# Patient Record
Sex: Male | Born: 1940
Health system: Southern US, Community
[De-identification: ages and names within clinical notes are randomized; demographics above are authoritative.]

## PROBLEM LIST (undated history)

## (undated) DIAGNOSIS — E785 Hyperlipidemia, unspecified: Secondary | ICD-10-CM

## (undated) DIAGNOSIS — M1990 Unspecified osteoarthritis, unspecified site: Secondary | ICD-10-CM

## (undated) DIAGNOSIS — K219 Gastro-esophageal reflux disease without esophagitis: Secondary | ICD-10-CM

## (undated) DIAGNOSIS — C801 Malignant (primary) neoplasm, unspecified: Secondary | ICD-10-CM

## (undated) DIAGNOSIS — I499 Cardiac arrhythmia, unspecified: Secondary | ICD-10-CM

## (undated) DIAGNOSIS — I10 Essential (primary) hypertension: Secondary | ICD-10-CM

## (undated) DIAGNOSIS — N200 Calculus of kidney: Secondary | ICD-10-CM

## (undated) DIAGNOSIS — I209 Angina pectoris, unspecified: Secondary | ICD-10-CM

## (undated) HISTORY — DX: Gastro-esophageal reflux disease without esophagitis: K21.9

## (undated) HISTORY — DX: Essential (primary) hypertension: I10

## (undated) HISTORY — DX: Calculus of kidney: N20.0

## (undated) HISTORY — DX: Hyperlipidemia, unspecified: E78.5

## (undated) HISTORY — PX: TOTAL KNEE ARTHROPLASTY: SHX125

---

## 1963-03-20 HISTORY — PX: EYE SURGERY: SHX253

## 2005-08-02 ENCOUNTER — Inpatient Hospital Stay (HOSPITAL_COMMUNITY): Admission: RE | Admit: 2005-08-02 | Discharge: 2005-08-06 | Payer: Self-pay | Admitting: Orthopedic Surgery

## 2008-04-21 ENCOUNTER — Ambulatory Visit (HOSPITAL_COMMUNITY): Admission: RE | Admit: 2008-04-21 | Discharge: 2008-04-21 | Payer: Self-pay | Admitting: Orthopedic Surgery

## 2009-06-08 ENCOUNTER — Encounter: Payer: Self-pay | Admitting: Cardiovascular Disease

## 2009-06-15 DIAGNOSIS — E785 Hyperlipidemia, unspecified: Secondary | ICD-10-CM

## 2009-06-15 DIAGNOSIS — R0789 Other chest pain: Secondary | ICD-10-CM

## 2009-06-15 DIAGNOSIS — M170 Bilateral primary osteoarthritis of knee: Secondary | ICD-10-CM | POA: Insufficient documentation

## 2009-06-15 DIAGNOSIS — R9431 Abnormal electrocardiogram [ECG] [EKG]: Secondary | ICD-10-CM | POA: Insufficient documentation

## 2009-06-15 DIAGNOSIS — K219 Gastro-esophageal reflux disease without esophagitis: Secondary | ICD-10-CM

## 2009-06-16 ENCOUNTER — Ambulatory Visit: Payer: Self-pay | Admitting: Cardiovascular Disease

## 2009-06-16 DIAGNOSIS — R072 Precordial pain: Secondary | ICD-10-CM | POA: Insufficient documentation

## 2009-06-16 DIAGNOSIS — R079 Chest pain, unspecified: Secondary | ICD-10-CM

## 2009-06-28 ENCOUNTER — Telehealth (INDEPENDENT_AMBULATORY_CARE_PROVIDER_SITE_OTHER): Payer: Self-pay | Admitting: *Deleted

## 2009-06-29 ENCOUNTER — Ambulatory Visit (HOSPITAL_COMMUNITY): Admission: RE | Admit: 2009-06-29 | Discharge: 2009-06-29 | Payer: Self-pay | Admitting: Cardiovascular Disease

## 2009-06-29 ENCOUNTER — Encounter (HOSPITAL_COMMUNITY): Admission: RE | Admit: 2009-06-29 | Discharge: 2009-08-17 | Payer: Self-pay | Admitting: Cardiovascular Disease

## 2009-06-29 ENCOUNTER — Encounter: Payer: Self-pay | Admitting: Cardiovascular Disease

## 2009-06-29 ENCOUNTER — Ambulatory Visit: Payer: Self-pay

## 2009-06-29 ENCOUNTER — Ambulatory Visit: Payer: Self-pay | Admitting: Cardiovascular Disease

## 2009-07-12 ENCOUNTER — Ambulatory Visit: Payer: Self-pay | Admitting: Cardiovascular Disease

## 2009-07-12 DIAGNOSIS — I119 Hypertensive heart disease without heart failure: Secondary | ICD-10-CM | POA: Insufficient documentation

## 2010-04-18 NOTE — Progress Notes (Signed)
Summary: Nucle ar Pre-Procedure  Phone Note Outgoing Call Call back at [facility] Phone [phone]   Call placed by: Stanton Kidney, EMT-P,  June 28, 2009 2:21 PM Action Taken: Phone Call Completed Summary of Call: Reviewed information on Myoview Information Sheet (see scanned document for further details).  Spoke with Patient's wife.    Nuclear Med Background Indications for Stress Test: Evaluation for Ischemia   History: GXT  History Comments: 3/11 GXT: Non- Dx, 2' RBBB  Symptoms: Chest Pain, Chest Pressure    Nuclear Pre-Procedure Cardiac Risk Factors: Family History - CAD, Lipids, RBBB Height (in): 68

## 2010-04-18 NOTE — Assessment & Plan Note (Signed)
Summary: Cardiology Nuclear Study  Nuclear Med Background Indications for Stress Test: Evaluation for Ischemia   History: GXT  History Comments: 3/11 GXT: Non- Dx, 2' RBBB  Symptoms: Chest Pain, Chest Pressure  Symptoms Comments: Last episode of YN:WGNFAOZH, now, 1/10.   Nuclear Pre-Procedure Cardiac Risk Factors: Family History - CAD, Lipids, RBBB Caffeine/Decaff Intake: None NPO After: 8:00 AM Lungs: Clear IV 0.9% NS with Angio Cath: 20g     IV Site: (R) AC IV Started by: Irean Hong RN Chest Size (in) 44     Height (in): 68 Weight (lb): 198 BMI: 30.21  Nuclear Med Study 1 or 2 day study:  1 day     Stress Test Type:  Stress Reading MD:  Charlton Haws, MD     Referring MD:  Verne Carrow, MD Resting Radionuclide:  Technetium 34m Tetrofosmin     Resting Radionuclide Dose:  11 mCi  Stress Radionuclide:  Technetium 9m Tetrofosmin     Stress Radionuclide Dose:  33 mCi   Stress Protocol Exercise Time (min):  5:30 min     Max HR:  142 bpm     Predicted Max HR:  152 bpm  Max Systolic BP: 225 mm Hg     Percent Max HR:  93.42 %     METS: 7.0 Rate Pressure Product:  08657    Stress Test Technologist:  Rea College CMA-N     Nuclear Technologist:  Domenic Polite CNMT  Rest Procedure  Myocardial perfusion imaging was performed at rest 45 minutes following the intravenous administration of Myoview Technetium 19m Tetrofosmin.  Stress Procedure  The patient exercised for  5:30.  The patient stopped due to a hypertensive response, 225/92.  He denied any chest pain.  There were nonspecific T- wave changes with exercise and occasional PVC's in recovery.  Myoview was injected at peak exercise and myocardial perfusion imaging was performed after a brief delay.  QPS Raw Data Images:  Normal; no motion artifact; normal heart/lung ratio. Stress Images:  NI: Uniform and normal uptake of tracer in all myocardial segments. Rest Images:  Normal homogeneous uptake in all areas of the  myocardium. Subtraction (SDS):  Normal Transient Ischemic Dilatation:  .88  (Normal <1.22)  Lung/Heart Ratio:  .42  (Normal <0.45)  Quantitative Gated Spect Images QGS EDV:  108 ml QGS ESV:  44 ml QGS EF:  59 % QGS cine images:  normal  Findings Normal nuclear study      Overall Impression  Exercise Capacity: Fair exercise capacity. BP Response: Hypertensive blood pressure response. Clinical Symptoms: dyspnea ECG Impression: RBBB Overall Impression: Normal stress nuclear study. Overall Impression Comments: normal  Appended Document: Cardiology Nuclear Study Normal stress. No ischemia. Can we call him with the results. cdm  Appended Document: Cardiology Nuclear Study wife notified

## 2010-04-18 NOTE — Assessment & Plan Note (Signed)
Summary: per check out/sf   Visit Type:  Follow-up Primary Provider:  Helene Kelp, [facility]  CC:  chest pain.  History of Present Illness: 70 yo male with history of OA, hyperlipidemia and GERD here today for follow up. He was seen several weeks ago for further evaluation of chest pain. He told  me that he has been having left sided chest pain, dull ache, no radiation, mostly occurs with rest. No associated SOB, dizziness, nausea, vomiting, near syncope or syncope. It does seem to improve when he leans forward. He had a severe episode of chest pain seven years ago while moving heavy objects, resolved with rest. He had no cardiac workup at that time. Prior to the initial visit, he picked a heavy log up and he had the same sensation of heaviness that only lasted for a few seconds. No associated symptoms. [place] is [place] farm. His exercise tolerance has been good.   He has had some mild left sided pain since the last visit. No associated SOB, dizziness, nausea, near syncope or syncope. The pain is positional and improves if he moves around. His stress test showed no ischemia (see below). Echo with mild LVH with preserved LV systolic function.  He did have a hypertensive blood pressure response during the stress test with BP of 225/92.   Current Medications (verified): 1)  Nexium 40 Mg Cpdr (Esomeprazole Magnesium) .Marland Kitchen.. 1 Tab By Mouth Once Daily 2)  Lipitor 20 Mg Tabs (Atorvastatin Calcium) .... Take One Tablet By Mouth Daily. 3)  Meloxicam 15 Mg Tabs (Meloxicam) .... Tab By Mouth Once Daily 4)  Lovaza 1 Gm Caps (Omega-3-Acid Ethyl Esters) .... 2  Tabs By Mouth Once Daily 5)  Aspirin 81 Mg Tbec (Aspirin) .... Take One Tablet By Mouth Daily 6)  Multivitamins   Tabs (Multiple Vitamin) .Marland Kitchen.. 1 Tab By Mouth Once Daily 7)  Calcium-Vitamin D 500-200 Mg-Unit Tabs (Calcium Carbonate-Vitamin D) .Marland Kitchen.. 1 Tab By Mouth Once Daily 8)  Vitamin E 400 Unit Caps (Vitamin E) .Marland Kitchen.. 1 Tab By Mouth Once Daily 9)   Vitamin D3 400 Unit Tabs (Cholecalciferol) .Marland Kitchen.. 1 Tab By Mouth Once Daily  Allergies (verified): 1)  ! Pcn 2)  ! Sulfa  Past History:  Past Medical History: Last updated: 06/16/2009 GERD (ICD-530.81) OSTEOARTHRITIS, KNEE (ICD-715.96) HYPERLIPIDEMIA (ICD-272.4) Nephrolithiasis    Social History: Reviewed history from 06/16/2009 and no changes required. 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  The patient denies fatigue, malaise, fever, weight gain/loss, vision loss, decreased hearing, hoarseness, palpitations, shortness of breath, prolonged cough, wheezing, sleep apnea, coughing up blood, abdominal pain, blood in stool, nausea, vomiting, diarrhea, heartburn, incontinence, blood in urine, muscle weakness, joint pain, leg swelling, rash, skin lesions, headache, fainting, dizziness, depression, anxiety, enlarged lymph nodes, easy bruising or bleeding, and environmental allergies.    Vital Signs:  Patient profile:   70 year old male Height:      68 inches Weight:      199 pounds BMI:     30.37 Pulse rate:   54 / minute Resp:     16 per minute BP sitting:   142 / 80  (left arm)  Vitals Entered By: Marrion Coy, CNA (July 12, 2009 12:04 PM)  Physical Exam  General:  General: Well developed, well nourished, NAD Psychiatric: Mood and affect normal Neck: No  JVD, no carotid bruits, no thyromegaly, no lymphadenopathy. Lungs:Clear bilaterally, no wheezes, rhonci, crackles CV: RRR no murmurs, gallops rubs Abdomen: soft, NT, ND, BS present Extremities: No edema, pulses 2+.    Nuclear ETT  Procedure date:  06/29/2009  Findings:      The patient exercised for  5:30.  The patient stopped due to a hypertensive response, 225/92.  He denied any chest pain.  There were nonspecific T- wave changes with exercise and occasional PVC's in recovery.  Myoview was  injected at peak exercise and myocardial perfusion imaging was performed after a brief delay.  QPS  Raw Data Images:  Normal; no motion artifact; normal heart/lung ratio. Stress Images:  NI: Uniform and normal uptake of tracer in all myocardial segments. Rest Images:  Normal homogeneous uptake in all areas of the myocardium. Subtraction (SDS):  Normal Transient Ischemic Dilatation:  .88  (Normal <1.22)  Lung/Heart Ratio:  .42  (Normal <0.45)  Quantitative Gated Spect Images  QGS EDV:  108 ml QGS ESV:  44 ml QGS EF:  59 % QGS cine images:  normal  Findings  Normal nuclear study  Overall Impression   Exercise Capacity: Fair exercise capacity. BP Response: Hypertensive blood pressure response. Clinical Symptoms: dyspnea ECG Impression: RBBB Overall Impression: Normal stress nuclear study. Overall Impression Comments: normal  Echocardiogram  Procedure date:  06/29/2009  Findings:      Study Conclusions            - Left ventricle: The cavity size was normal. Wall thickness was       increased in a pattern of mild LVH. Systolic function was normal.       The estimated ejection fraction was in the range of 55% to 60%.     - Aortic valve: Valve area: 2.08cm 2(VTI). Valve area: 2.11cm 2       (Vmax).     - Left atrium: The atrium was mildly dilated.     - Atrial septum: No defect or patent foramen ovale was identified.  Impression & Recommendations:  Problem # 1:  CHEST PAIN (ICD-786.50) Non-cardiac chest pain. Stress test without ischemia. This could be related to his GERD or musculoskeletal.  No further cardiac workup at this time.   His updated medication list for this problem includes:    Aspirin 81 Mg Tbec (Aspirin) .Marland Kitchen... Take one tablet by mouth daily    Amlodipine Besylate 5 Mg Tabs (Amlodipine besylate) .Marland Kitchen... Take one tablet by mouth daily  Problem # 2:  HYPERTENSION, HEART CONTROLLED W/O ASSOC CHF (ICD-402.10) His resting BP is slightly above baseline. He had a  hypertensive response to exercise during the stress test. His echo shows mild LVH c/'w hypertensive heart disease. Will start Norvasc 5 mg by mouth once daily. I willl have him follow his BP trend at home. He will follow up in [facility] with Helene Kelp, [facility] for further titration of Norvasc or addition of other BP meds. He will bring his BP log into the follow up appt.   His updated medication list for this problem includes:    Aspirin 81 Mg Tbec (Aspirin) .Marland Kitchen... Take one tablet by mouth daily    Amlodipine Besylate 5 Mg Tabs (Amlodipine besylate) .Marland Kitchen... Take one tablet by mouth daily  His updated medication list for this problem includes:    Aspirin 81 Mg Tbec (Aspirin) .Marland Kitchen... Take one tablet by mouth daily    Amlodipine Besylate 5 Mg Tabs (Amlodipine besylate) .Marland Kitchen... Take one tablet  by mouth daily  Patient Instructions: 1)  Your physician recommends that you schedule a follow-up appointment as needed 2)  Your physician has recommended you make the following change in your medication: Start Norvasc (amlodipine) 5 mg by mouth daily Prescriptions: AMLODIPINE BESYLATE 5 MG TABS (AMLODIPINE BESYLATE) take one tablet by mouth daily  #30 x 11   Entered by:   Dossie Arbour, RN, BSN   Authorized by:   Verne Carrow, MD   Signed by:   Dossie Arbour, RN, BSN on 07/12/2009   Method used:   Electronically to        [facility] [phone]* (retail)       [address]       [place], [place]  [address]       Ph: [phone] or [phone]       Fax: [phone]   RxID:   [id number]

## 2010-04-18 NOTE — Assessment & Plan Note (Signed)
Summary: np6/ atypical cp, abn treadmill. pt has medicare/ bcbs/ gd   Visit Type:  Initial Consult Primary Provider:  Helene Kelp, [facility]  CC:  Left sided chest pain. Shawn Lambert  History of Present Illness: 70 yo male with history of OA, hyperlipidemia and GERD referred today for further evaluation of chest pain. He tells me that he has been having left sided chest pain, dull ache, no radiation, mostly occurs with rest. No associated SOB, dizziness, nausea, vomiting, near syncope or syncope. It does seem to improve when he leans forward. He had a severe episode of chest pain seven years ago while moving heavy objects, resolved with rest. He had no cardiac workup at that time. Last week, he picked a heavy log up and he had the same sensation of heaviness that only lasted for a few seconds. No associated symptoms. He is very active on his farm. His exercise tolerance has been good.   Current Medications (verified): 1)  Nexium 40 Mg Cpdr (Esomeprazole Magnesium) .Shawn Lambert.. 1 Tab By Mouth Once Daily 2)  Lipitor 20 Mg Tabs (Atorvastatin Calcium) .... Take One Tablet By Mouth Daily. 3)  Meloxicam 15 Mg Tabs (Meloxicam) .... Tab By Mouth Once Daily 4)  Lovaza 1 Gm Caps (Omega-3-Acid Ethyl Esters) .... 2  Tabs By Mouth Once Daily 5)  Aspirin 81 Mg Tbec (Aspirin) .... Take One Tablet By Mouth Daily 6)  Multivitamins   Tabs (Multiple Vitamin) .Shawn Lambert.. 1 Tab By Mouth Once Daily 7)  Calcium-Vitamin D 500-200 Mg-Unit Tabs (Calcium Carbonate-Vitamin D) .Shawn Lambert.. 1 Tab By Mouth Once Daily 8)  Vitamin E 400 Unit Caps (Vitamin E) .Shawn Lambert.. 1 Tab By Mouth Once Daily 9)  Vitamin D3 400 Unit Tabs (Cholecalciferol) .Shawn Lambert.. 1 Tab By Mouth Once Daily  Allergies (verified): 1)  ! Pcn  Past History:  Past Medical History: GERD (ICD-530.81) OSTEOARTHRITIS, KNEE (ICD-715.96) HYPERLIPIDEMIA (ICD-272.4) Nephrolithiasis    Past Surgical History: Left knee replacement 2007 Right eye removed  Family History: Reviewed history and no changes  required. Mother-deceased, CHF, AAA Father-deceased CHF Brother alive, "mild heart attack" age 75  Social History: Reviewed history and no changes required. 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  The patient denies fatigue, malaise, fever, weight gain/loss, vision loss, decreased hearing, hoarseness, palpitations, shortness of breath, prolonged cough, wheezing, sleep apnea, coughing up blood, abdominal pain, blood in stool, nausea, vomiting, diarrhea, heartburn, incontinence, blood in urine, muscle weakness, joint pain, leg swelling, rash, skin lesions, headache, fainting, dizziness, depression, anxiety, enlarged lymph nodes, easy bruising or bleeding, and environmental allergies.    Vital Signs:  Patient profile:   70 year old male Height:      68 inches Weight:      206 pounds BMI:     31.44 Pulse rate:   57 / minute Resp:     14 per minute BP sitting:   136 / 80  (left arm)  Vitals Entered By: Kem Parkinson (June 16, 2009 1:19 PM)  Physical Exam  General:  General: Well developed, well nourished, NAD HEENT: OP clear, mucus membranes moist. Prosthetic right eye. SKIN: warm, dry Neuro: No focal deficits Musculoskeletal: Muscle strength 5/5 all ext Psychiatric: Mood and affect normal Neck: No JVD, no carotid bruits, no thyromegaly, no lymphadenopathy. Lungs:Clear bilaterally, no wheezes, rhonci, crackles CV: RRR no murmurs, gallops rubs Abdomen: soft, NT, ND, BS  present Extremities: No edema, pulses 1+ bilateral PT.     EKG  Procedure date:  06/16/2009  Findings:      Sinus rhythm with rare PVC. LAD. RBBB  Exercise Stress Test  Procedure date:  06/08/2009  Findings:      Performed in Dr. Buel Ream office [facility]):  Target heart rate achieved. Baseline RBBB with non-diagnostic changes during exercise. No chest pain with exercise. Stopped  secondary to THR achieved and knee pain.   Impression & Recommendations:  Problem # 1:  CHEST PAIN (ICD-786.50) His pain is mostly non-exertional, however, he has had severale episodes of heaviness with heavy exertion. His risk factors for CAD include hyperlipidemia and a family history of CAD. The exercise stress test in Dr. Kathi Der office is non-diagnostic with upsloping ST depression with exercise in the setting of RBBB. I will order an exercise stress myoview to further risk stratify.  Will also check echo to assess LV size and function, rule out any valvular abnormalities.   His updated medication list for this problem includes:    Aspirin 81 Mg Tbec (Aspirin) .Shawn Lambert... Take one tablet by mouth daily  Other Orders: EKG w/ Interpretation (93000) Nuclear Stress Test (Nuc Stress Test) Echocardiogram (Echo)  Patient Instructions: 1)  Your physician recommends that you schedule a follow-up appointment in: 3-4 weeks 2)  Your physician has requested that you have an echocardiogram.  Echocardiography is a painless test that uses sound waves to create images of your heart. It provides your doctor with information about the size and shape of your heart and how well your heart's chambers and valves are working.  This procedure takes approximately one hour. There are no restrictions for this procedure. 3)  Your physician has requested that you have an exercise stress myoview.  For further information please visit [email or web address]  Please follow instruction sheet, as given.

## 2010-07-06 ENCOUNTER — Other Ambulatory Visit: Payer: Self-pay | Admitting: Cardiovascular Disease

## 2010-08-04 NOTE — Op Note (Signed)
NAMEABDULAHI, Shawn Lambert NO.:  [id number]   MEDICAL RECORD NO.:  [id number]          PATIENT TYPE:  INP   LOCATION:  5015                         FACILITY:  [facility]   PHYSICIAN:  Almedia Balls. Ranell Patrick, M.D. DATE OF BIRTH:  1940-08-15   DATE OF PROCEDURE:  08/02/2005  DATE OF DISCHARGE:                                 OPERATIVE REPORT   PREOPERATIVE DIAGNOSIS:  Left knee end-stage osteoarthritis.   POSTOPERATIVE DIAGNOSIS:  Left knee end-stage osteoarthritis.   PROCEDURE PERFORMED:  Left knee total knee replacement.   ATTENDING SURGEON:  Almedia Balls. Ranell Patrick, M.D.   ASSISTANT:  Donnie Coffin. Dixon, P.A.-C.   ANESTHESIA:  General anesthesia was used, plus femoral block.   ESTIMATED BLOOD LOSS:  Minimal.   TOURNIQUET TIME:  90 minutes.   INSTRUMENT COUNT:  Correct.   There were no complications.   FLUID REPLACEMENT:  1100 cc crystalloid.   URINE OUTPUT:  400 cc.   INDICATIONS:  The patient is a 70 year old male with a history of worsening  left knee pain.  the patient had a work-related injury which has  precipitated the significant decline in his function and his pain in his  knee.  The patient has progressed to the point now where he has disabling  pain that affects not only his ability to walk but also his ability to rest  comfortably.  The patient presents now for operative knee replacement,  having discussed all other options and failed conservative management to  this point.  Informed consent was obtained.   DESCRIPTION OF PROCEDURE:  After an adequate level of anesthesia was  achieved, the patient was positioned supine on the operating room table.  The left lower extremity was sterilely prepped and draped over a nonsterile  tourniquet.  After elevation and exsanguination of the limb using an Esmarch  bandage, we flexed the knee and elevated the tourniquet up to 300 mmHg.  We  made the knee incision, which was a midline incision, with the knee flexed.  Dissection was carried sharply down through the subcutaneous tissues.  Median parapatellar arthrotomy created utilizing a knife.  We everted the  patella, released the lateral patellofemoral ligaments, and then flexed the  knee again, exposing the distal femur.  We went ahead and made an opening  hole with the distal femoral step cut drill.  Introduction of the  intramedullary guide for resection of the distal 10 mm of femur on 5 degrees  left.  Once the distal femoral cut was made, we went ahead and size the  femur to a size 4, took our 4-in-1 block, and made our anterior and  posterior cuts, with attention toward the epicondylar line.  We also made  our chamfer cuts as well.  Once we did that, we went ahead and made our box  cut for the posterior cruciate replacing prosthesis.  Next, we subluxed the  tibia forward, removed the remaining meniscal tissue as well as ACL and PCL,  and went ahead with the extramedullary tibial guide and made our 90-degree  cut with a slight posterior slope.  We  set that on 6 mm off the affected  size.  We then checked our gaps in both flexion and extension.  We were  happy with the symmetric gaps.  We went ahead and prepared our tibia.  It  sized to a size 4, and made our drill hole and keel punch.  Next, we placed  the trial components in place, with a size 4 femur, size 4 tibia, and a 10  mm insert.  We were able to take the knee through a full range of motion.  We actually had a little excess room, so we placed a 12.5 tibial insert in,  and we were able to get full extension with flexion, and had nice flexion  stability as well as extension stability.  The patella showed extensive wear  to the point where the patellar thickness was down below 20 mm of thickness  and was not amenable to patellar resurfacing.  We removed all marginal  osteophytes off the patella and decided to leave that alone so there would  be minimal risk of patellar fracture.  The patella  tracked well once we  reduced the patella back onto the trial components.  We took the knee again  through a full arc of motion.  Nice stability and nice patellar tracking.  At this point, we removed the trial components.  Thoroughly irrigated the  knee with pulsatile irrigation.  Removed osteophytes off the posterior  femur, checking for excessive soft tissue which might impinge, and then went  ahead and using third-generation vacuum mixing and pressurization  techniques, cemented the real components into place, the size 4 tibia, and  this was a DePuy Sigma rotating platform prosthesis, and then cemented on  the femur as well, and placed a trial 10.5 insert in and reduced the knee  into extension, and applied an axial load.  We moved all of the cement using  Therapist, nutritional.  Once the cement was allowed to harden, we re-trialed with a  10.5, and we were happy with the soft tissue balance, and we were able to  achieve full extension, excellent flexion with the patella reduced, and  excellent patellar tracking.  We removed the 10.5 insert and inspected one  more time posteriorly for any excess cement, and then placed the real 10.5  rotating platform size 4 implant in place.  At this point, we went ahead and  placed a drain deep into the joint and closed the parapatellar arthrotomy  with interrupted #1 PDS suture, followed by 0 Vicryl for deep subcutaneous  and 2-0 Vicryl for superficial subcutaneous, and 3-0 Monocryl for skin.  Steri-Strips were applied, followed by a sterile dressing.  The patient was  placed in a knee immobilizer and taken to the respiratory rate.           ______________________________  Almedia Balls. Ranell Patrick, M.D.     SRN/MEDQ  D:  08/02/2005  T:  08/03/2005  Job:  [id number]

## 2010-08-04 NOTE — H&P (Signed)
NAME:  Shawn Lambert, Shawn Lambert NO.:  [id number]   MEDICAL RECORD NO.:  [id number]           PATIENT TYPE:   LOCATION:                                 FACILITY:   PHYSICIAN:  Maisie Fus B. Dixon, P.A.  DATE OF BIRTH:  30-Oct-1940   DATE OF ADMISSION:  DATE OF DISCHARGE:                                HISTORY & PHYSICAL   CHIEF COMPLAINT:  Left knee pain.   HISTORY OF PRESENT ILLNESS:  Patient is a 70 year old male who comes in  complaining about left knee pain that has been going on for more than a  year.  This has been refractory to conservative treatment.  The patient had  injured this knee in a work related incident.  Patient presents for a left  total knee arthroplasty to be completed by Dr. Almedia Balls. Norris.   ALLERGIES:  SULFA  and PENICILLIN.   CURRENT MEDICATIONS:  1.  Lipitor 20 mg p.o. daily.  2.  Nexium 40 mg p.o. daily.  3.  Mobic 15 mg p.o. daily.  4.  Tramadol 50 mg p.o. q.4-6h. p.r.n. pain.   PAST MEDICAL HISTORY:  GERD.   SOCIAL HISTORY:  Patient is married.  No alcohol or smoking.  He works as an  Oncologist.   FAMILY HISTORY:  Significant for CVA and osteoarthritis.   REVIEW OF SYSTEMS:  Negative except for painful ambulation.   PHYSICAL EXAMINATION:  GENERAL APPEARANCE:  This is a healthy-appearing 70-  year-old male in no acute distress, pleasant mood and affect.  Alert and  oriented x3.  VITAL SIGNS:  Pulse 80, respirations 18, blood pressure 132/78.  HEENT:  Cranial nerves II-XII grossly intact.  NECK:  Full range of motion with no tenderness to palpation of paraspinal  muscle and spinous processes.  CHEST:  Active breath sounds bilaterally with no wheezing, rhonchi or rales.  CARDIOVASCULAR:  Regular rate and rhythm with no murmur.  ABDOMEN:  Nontender and nondistended with active bowel sounds and no  pulsatile masses.  EXTREMITIES:  Mild crepitus right greater than left with some mild  tenderness in the medial and lateral  joint line at the left knee.  Neurovascular intact.  Skin intact.  Capillary refill less than two seconds.  He has no pedal edema.  NEUROLOGIC:  He does have a mildly antalgic gait.   X-ray shows severe osteoarthritis to his left knee.   IMPRESSION:  End-stage left knee osteoarthritis.   PLAN:  Left total knee arthroplasty by Dr. Malon Kindle.      Thomas B. Ferne Coe.     TBD/MEDQ  D:  07/18/2005  T:  07/18/2005  Job:  [id number]

## 2010-08-04 NOTE — Discharge Summary (Signed)
Shawn Lambert, Shawn Lambert NO.:  [id number]   MEDICAL RECORD NO.:  [id number]          PATIENT TYPE:  INP   LOCATION:  5015                         FACILITY:  [facility]   PHYSICIAN:  Almedia Balls. Ranell Patrick, M.D. DATE OF BIRTH:  [birth year]   DATE OF ADMISSION:  08/02/2005  DATE OF DISCHARGE:  08/06/2005                                 DISCHARGE SUMMARY   ADMISSION DIAGNOSES:  1.  Left end-stage knee osteoarthritis.  2.  Hyperlipidemia.  3.  Gastroesophageal reflux disease.   DISCHARGE DIAGNOSES:  1.  Left end-stage osteoarthritis, status post left total knee arthroplasty.  2.  Hyperlipidemia.  3.  Gastroesophageal reflux disease.   BRIEF HISTORY:  Patient is a 70 year old male who presented to the office  with worsening knee pain after repeated cortisone injections and a trial of  physical therapy.  Patient elected to have a left total knee arthroplasty,  which was completed.   PROCEDURE:  Patient had a left total knee arthroplasty using the DePuy  rotating Sigma platform knee system on Aug 02, 2005.  General anesthesia was  used.  Estimated blood loss was minimal.  Fluid replacement was 1100 cc.  Urine output was 400 cc.  Perioperative antibiotics were given.   HOSPITAL COURSE:  Patient was admitted for the above-stated procedure on Aug 02, 2005, which she tolerated well and after an adequate time in the post  anesthesia care unit, was transferred up to 5000.  On postop day #1, the  patient complained about moderate pain to that left knee but was able to  complete some physical therapy with knee range of motion.  Did have 0-90  degrees of knee range of motion passively on postop day #1.  Neurovascularly  was intact. Skin was intact.   On postop day #2, the patient did have a difficult day in regards to pain.  Physical therapy was very difficult for him that day, but he continued to  improve over the next couple of days.  The patient did have postop anemia  that we did  just monitor.  The patient did drop down to 8.4 and 24 for his  H&H, but he was asymptomatic with no dizziness and no lightheadedness.  He  did have good skin color.  His dressings were changed daily with no signs of  erythema or infection.  He had no drainage.  Neurovascular is intact with no  pedal edema and no calf tenderness.  The patient was discharged home on Aug 06, 2005.   DISCHARGE PLAN:  Patient is being discharged home on Aug 06, 2005.  His diet  is regular.  His condition is stable.   DISCHARGE MEDICATIONS:  1.  Percocet 1-2 tabs q.4-6h. p.r.n. pain.  2.  Robaxin 500 mg p.o. q.6h.  3.  Coumadin per pharmacy protocol.  4.  Lipitor 20 mg p.o. daily.  5.  Nexium 40 mg p.o. daily.  6.  Mobic 15 mg p.o. daily p.r.n.   Patient has allergies to PENICILLIN and SULFA.   FOLLOW UP:  Patient is to follow up with Dr. Viviann Spare  Norris in two weeks.      Thomas B. Dixon, P.A.    ______________________________  Almedia Balls. Ranell Patrick, M.D.    TBD/MEDQ  D:  08/06/2005  T:  08/06/2005  Job:  [id number]

## 2011-04-03 DIAGNOSIS — Z79899 Other long term (current) drug therapy: Secondary | ICD-10-CM | POA: Diagnosis not present

## 2011-04-03 DIAGNOSIS — R109 Unspecified abdominal pain: Secondary | ICD-10-CM | POA: Diagnosis not present

## 2011-04-03 DIAGNOSIS — M79609 Pain in unspecified limb: Secondary | ICD-10-CM | POA: Diagnosis not present

## 2011-04-03 DIAGNOSIS — E860 Dehydration: Secondary | ICD-10-CM | POA: Diagnosis not present

## 2011-04-03 DIAGNOSIS — I10 Essential (primary) hypertension: Secondary | ICD-10-CM | POA: Diagnosis not present

## 2011-04-03 DIAGNOSIS — K219 Gastro-esophageal reflux disease without esophagitis: Secondary | ICD-10-CM | POA: Diagnosis not present

## 2011-04-03 DIAGNOSIS — N201 Calculus of ureter: Secondary | ICD-10-CM | POA: Diagnosis not present

## 2011-04-03 DIAGNOSIS — E785 Hyperlipidemia, unspecified: Secondary | ICD-10-CM | POA: Diagnosis not present

## 2011-04-03 DIAGNOSIS — D649 Anemia, unspecified: Secondary | ICD-10-CM | POA: Diagnosis not present

## 2011-04-09 DIAGNOSIS — N200 Calculus of kidney: Secondary | ICD-10-CM | POA: Diagnosis not present

## 2011-04-09 DIAGNOSIS — R7989 Other specified abnormal findings of blood chemistry: Secondary | ICD-10-CM | POA: Diagnosis not present

## 2011-04-09 DIAGNOSIS — I10 Essential (primary) hypertension: Secondary | ICD-10-CM | POA: Diagnosis not present

## 2011-04-13 ENCOUNTER — Ambulatory Visit: Payer: Self-pay | Admitting: Urology

## 2011-04-18 DIAGNOSIS — L821 Other seborrheic keratosis: Secondary | ICD-10-CM | POA: Diagnosis not present

## 2011-04-18 DIAGNOSIS — L820 Inflamed seborrheic keratosis: Secondary | ICD-10-CM | POA: Diagnosis not present

## 2011-07-02 DIAGNOSIS — K219 Gastro-esophageal reflux disease without esophagitis: Secondary | ICD-10-CM | POA: Diagnosis not present

## 2011-07-02 DIAGNOSIS — E785 Hyperlipidemia, unspecified: Secondary | ICD-10-CM | POA: Diagnosis not present

## 2011-07-02 DIAGNOSIS — I10 Essential (primary) hypertension: Secondary | ICD-10-CM | POA: Diagnosis not present

## 2011-10-17 DIAGNOSIS — Z85828 Personal history of other malignant neoplasm of skin: Secondary | ICD-10-CM | POA: Diagnosis not present

## 2011-10-17 DIAGNOSIS — D485 Neoplasm of uncertain behavior of skin: Secondary | ICD-10-CM | POA: Diagnosis not present

## 2011-10-17 DIAGNOSIS — L820 Inflamed seborrheic keratosis: Secondary | ICD-10-CM | POA: Diagnosis not present

## 2011-10-17 DIAGNOSIS — L570 Actinic keratosis: Secondary | ICD-10-CM | POA: Diagnosis not present

## 2012-01-03 DIAGNOSIS — I10 Essential (primary) hypertension: Secondary | ICD-10-CM | POA: Diagnosis not present

## 2012-01-03 DIAGNOSIS — Z23 Encounter for immunization: Secondary | ICD-10-CM | POA: Diagnosis not present

## 2012-01-03 DIAGNOSIS — E785 Hyperlipidemia, unspecified: Secondary | ICD-10-CM | POA: Diagnosis not present

## 2012-01-03 DIAGNOSIS — L0291 Cutaneous abscess, unspecified: Secondary | ICD-10-CM | POA: Diagnosis not present

## 2012-01-03 DIAGNOSIS — K219 Gastro-esophageal reflux disease without esophagitis: Secondary | ICD-10-CM | POA: Diagnosis not present

## 2012-01-03 DIAGNOSIS — Z125 Encounter for screening for malignant neoplasm of prostate: Secondary | ICD-10-CM | POA: Diagnosis not present

## 2012-01-24 DIAGNOSIS — Z970 Presence of artificial eye: Secondary | ICD-10-CM | POA: Diagnosis not present

## 2012-01-24 DIAGNOSIS — H5200 Hypermetropia, unspecified eye: Secondary | ICD-10-CM | POA: Diagnosis not present

## 2012-01-24 DIAGNOSIS — H2510 Age-related nuclear cataract, unspecified eye: Secondary | ICD-10-CM | POA: Diagnosis not present

## 2012-01-24 DIAGNOSIS — H01009 Unspecified blepharitis unspecified eye, unspecified eyelid: Secondary | ICD-10-CM | POA: Diagnosis not present

## 2012-03-06 ENCOUNTER — Other Ambulatory Visit (HOSPITAL_COMMUNITY): Payer: Self-pay | Admitting: Orthopedic Surgery

## 2012-03-06 DIAGNOSIS — M25562 Pain in left knee: Secondary | ICD-10-CM

## 2012-03-24 ENCOUNTER — Encounter (HOSPITAL_COMMUNITY)
Admission: RE | Admit: 2012-03-24 | Discharge: 2012-03-24 | Disposition: A | Discharge status: Home / Self Care | Source: Ambulatory Visit | Attending: Orthopedic Surgery | Admitting: Orthopedic Surgery

## 2012-03-24 DIAGNOSIS — IMO0002 Reserved for concepts with insufficient information to code with codable children: Secondary | ICD-10-CM | POA: Diagnosis not present

## 2012-03-24 DIAGNOSIS — Z96659 Presence of unspecified artificial knee joint: Secondary | ICD-10-CM | POA: Insufficient documentation

## 2012-03-24 DIAGNOSIS — M1710 Unilateral primary osteoarthritis, unspecified knee: Secondary | ICD-10-CM | POA: Insufficient documentation

## 2012-03-24 DIAGNOSIS — M25562 Pain in left knee: Secondary | ICD-10-CM

## 2012-03-24 MED ORDER — TECHNETIUM TC 99M MEDRONATE IV KIT
26.0000 | PACK | Freq: Once | INTRAVENOUS | Status: AC | PRN
  Administered 2012-03-24: 26 via INTRAVENOUS

## 2012-04-21 DIAGNOSIS — D0439 Carcinoma in situ of skin of other parts of face: Secondary | ICD-10-CM | POA: Diagnosis not present

## 2012-04-21 DIAGNOSIS — D485 Neoplasm of uncertain behavior of skin: Secondary | ICD-10-CM | POA: Diagnosis not present

## 2012-04-21 DIAGNOSIS — Z85828 Personal history of other malignant neoplasm of skin: Secondary | ICD-10-CM | POA: Diagnosis not present

## 2012-04-21 DIAGNOSIS — L570 Actinic keratosis: Secondary | ICD-10-CM | POA: Diagnosis not present

## 2012-04-21 DIAGNOSIS — D235 Other benign neoplasm of skin of trunk: Secondary | ICD-10-CM | POA: Diagnosis not present

## 2012-04-21 DIAGNOSIS — D234 Other benign neoplasm of skin of scalp and neck: Secondary | ICD-10-CM | POA: Diagnosis not present

## 2012-04-21 DIAGNOSIS — L820 Inflamed seborrheic keratosis: Secondary | ICD-10-CM | POA: Diagnosis not present

## 2012-05-08 DIAGNOSIS — D485 Neoplasm of uncertain behavior of skin: Secondary | ICD-10-CM | POA: Diagnosis not present

## 2012-11-03 DIAGNOSIS — Z85828 Personal history of other malignant neoplasm of skin: Secondary | ICD-10-CM | POA: Diagnosis not present

## 2012-11-03 DIAGNOSIS — L570 Actinic keratosis: Secondary | ICD-10-CM | POA: Diagnosis not present

## 2012-11-03 DIAGNOSIS — D485 Neoplasm of uncertain behavior of skin: Secondary | ICD-10-CM | POA: Diagnosis not present

## 2012-12-08 ENCOUNTER — Ambulatory Visit (INDEPENDENT_AMBULATORY_CARE_PROVIDER_SITE_OTHER): Payer: Medicare Other | Admitting: Family Medicine

## 2012-12-08 ENCOUNTER — Encounter: Payer: Self-pay | Admitting: Family Medicine

## 2012-12-08 VITALS — BP 130/72 | HR 52 | Temp 96.6°F | Wt 182.8 lb

## 2012-12-08 DIAGNOSIS — I10 Essential (primary) hypertension: Secondary | ICD-10-CM | POA: Diagnosis not present

## 2012-12-08 DIAGNOSIS — Z23 Encounter for immunization: Secondary | ICD-10-CM | POA: Diagnosis not present

## 2012-12-08 DIAGNOSIS — E785 Hyperlipidemia, unspecified: Secondary | ICD-10-CM

## 2012-12-08 DIAGNOSIS — K219 Gastro-esophageal reflux disease without esophagitis: Secondary | ICD-10-CM

## 2012-12-08 DIAGNOSIS — M1990 Unspecified osteoarthritis, unspecified site: Secondary | ICD-10-CM | POA: Diagnosis not present

## 2012-12-08 DIAGNOSIS — E559 Vitamin D deficiency, unspecified: Secondary | ICD-10-CM | POA: Diagnosis not present

## 2012-12-08 LAB — POCT CBC
Granulocyte percent: 70.1 %G (ref 37–80)
HCT, POC: 39.9 % — AB (ref 43.5–53.7)
Hemoglobin: 13.7 g/dL — AB (ref 14.1–18.1)
Lymph, poc: 1.2 (ref 0.6–3.4)
MCH, POC: 30.6 pg (ref 27–31.2)
MCHC: 34.2 g/dL (ref 31.8–35.4)
MCV: 89.3 fL (ref 80–97)
MPV: 7.2 fL (ref 0–99.8)
POC Granulocyte: 4.2 (ref 2–6.9)
POC LYMPH PERCENT: 20.3 %L (ref 10–50)
Platelet Count, POC: 192 10*3/uL (ref 142–424)
RBC: 4.5 M/uL — AB (ref 4.69–6.13)
RDW, POC: 12.8 %
WBC: 6 10*3/uL (ref 4.6–10.2)

## 2012-12-08 MED ORDER — OMEPRAZOLE 40 MG PO CPDR: 40.0000 mg | DELAYED_RELEASE_CAPSULE | Freq: Every day | ORAL | Status: DC

## 2012-12-08 MED ORDER — ATORVASTATIN CALCIUM 20 MG PO TABS: 20.0000 mg | ORAL_TABLET | Freq: Every day | ORAL | Status: DC

## 2012-12-08 MED ORDER — MELOXICAM 15 MG PO TABS: 15.0000 mg | ORAL_TABLET | Freq: Every day | ORAL | Status: DC

## 2012-12-08 MED ORDER — AMLODIPINE BESYLATE 5 MG PO TABS: 5.0000 mg | ORAL_TABLET | Freq: Every day | ORAL | Status: DC

## 2012-12-08 NOTE — Progress Notes (Signed)
  Subjective:    Patient ID: Shawn Lambert, male    DOB: Sep 27, 1940, 72 y.o.   MRN: [id number]  HPI  This 72 y.o. male presents for evaluation of routine follow up.  He has hx of  GERD, hypertension, OA, and hyperlipidemia.  Review of Systems No chest pain, SOB, HA, dizziness, vision change, N/V, diarrhea, constipation, dysuria, urinary urgency or frequency, myalgias, arthralgias or rash.     Objective:   Physical Exam Vital signs noted  Well developed well nourished male.  HEENT - Head atraumatic Normocephalic                Eyes - PERRLA, Conjuctiva - clear Sclera- Clear EOMI                Ears - EAC's Wnl TM's Wnl Gross Hearing WNL                Nose - Nares patent                 Throat - oropharanx wnl Respiratory - Lungs CTA bilateral Cardiac - RRR S1 and S2 without murmur GI - Abdomen soft Nontender and bowel sounds active x 4 Extremities - No edema. Neuro - Grossly intact.       Assessment & Plan:  Hyperlipemia - Plan: Lipid panel, atorvastatin (LIPITOR) 20 MG tablet  Hypertension - Plan: POCT CBC, CMP14+EGFR, amLODipine (NORVASC) 5 MG tablet  Vitamin D deficiency - Plan: Vit D  25 hydroxy (rtn osteoporosis monitoring)  Osteoarthritis - Plan: Vit D  25 hydroxy (rtn osteoporosis monitoring), meloxicam (MOBIC) 15 MG tablet  GERD (gastroesophageal reflux disease) - Plan: omeprazole (PRILOSEC) 40 MG capsule  Follow up in 6 months  Deatra Canter FNP

## 2012-12-08 NOTE — Patient Instructions (Signed)

## 2012-12-09 LAB — CMP14+EGFR
ALT: 18 IU/L (ref 0–44)
AST: 20 IU/L (ref 0–40)
Albumin/Globulin Ratio: 1.6 (ref 1.1–2.5)
Albumin: 4.2 g/dL (ref 3.5–4.8)
Alkaline Phosphatase: 108 IU/L (ref 39–117)
BUN/Creatinine Ratio: 20 (ref 10–22)
BUN: 20 mg/dL (ref 8–27)
CO2: 27 mmol/L (ref 18–29)
Calcium: 9.8 mg/dL (ref 8.6–10.2)
Chloride: 100 mmol/L (ref 97–108)
Creatinine, Ser: 1.01 mg/dL (ref 0.76–1.27)
GFR calc Af Amer: 86 mL/min/{1.73_m2} (ref >59)
GFR calc non Af Amer: 74 mL/min/{1.73_m2} (ref >59)
Globulin, Total: 2.7 g/dL (ref 1.5–4.5)
Glucose: 92 mg/dL (ref 65–99)
Potassium: 5.1 mmol/L (ref 3.5–5.2)
Sodium: 141 mmol/L (ref 134–144)
Total Bilirubin: 0.5 mg/dL (ref 0.0–1.2)
Total Protein: 6.9 g/dL (ref 6.0–8.5)

## 2012-12-09 LAB — LIPID PANEL
Chol/HDL Ratio: 3.1 ratio units (ref 0.0–5.0)
Cholesterol, Total: 152 mg/dL (ref 100–199)
HDL: 49 mg/dL (ref >39)
LDL Calculated: 91 mg/dL (ref 0–99)
Triglycerides: 58 mg/dL (ref 0–149)
VLDL Cholesterol Cal: 12 mg/dL (ref 5–40)

## 2012-12-09 LAB — VITAMIN D 25 HYDROXY (VIT D DEFICIENCY, FRACTURES): Vit D, 25-Hydroxy: 47.2 ng/mL (ref 30.0–100.0)

## 2012-12-18 ENCOUNTER — Telehealth: Payer: Self-pay | Admitting: *Deleted

## 2012-12-18 NOTE — Telephone Encounter (Signed)
Pt notified labs are ok.

## 2012-12-18 NOTE — Telephone Encounter (Signed)
Message copied by Bearl Mulberry on Thu Dec 18, 2012  2:26 PM ------      Message from: Deatra Canter      Created: Tue Dec 09, 2012  9:12 AM       Labs okay ------

## 2013-05-04 DIAGNOSIS — L570 Actinic keratosis: Secondary | ICD-10-CM | POA: Diagnosis not present

## 2013-05-04 DIAGNOSIS — Z85828 Personal history of other malignant neoplasm of skin: Secondary | ICD-10-CM | POA: Diagnosis not present

## 2013-06-09 ENCOUNTER — Telehealth: Payer: Self-pay | Admitting: Family Medicine

## 2013-06-09 NOTE — Telephone Encounter (Signed)
appt given for friday

## 2013-06-12 ENCOUNTER — Ambulatory Visit (INDEPENDENT_AMBULATORY_CARE_PROVIDER_SITE_OTHER): Payer: Medicare Other | Admitting: Family Medicine

## 2013-06-12 ENCOUNTER — Encounter: Payer: Self-pay | Admitting: Family Medicine

## 2013-06-12 VITALS — BP 139/71 | HR 55 | Temp 96.7°F | Ht 68.0 in | Wt 191.0 lb

## 2013-06-12 DIAGNOSIS — R079 Chest pain, unspecified: Secondary | ICD-10-CM | POA: Diagnosis not present

## 2013-06-12 DIAGNOSIS — R918 Other nonspecific abnormal finding of lung field: Secondary | ICD-10-CM | POA: Diagnosis not present

## 2013-06-12 DIAGNOSIS — K921 Melena: Secondary | ICD-10-CM | POA: Diagnosis not present

## 2013-06-12 DIAGNOSIS — K219 Gastro-esophageal reflux disease without esophagitis: Secondary | ICD-10-CM | POA: Diagnosis not present

## 2013-06-12 DIAGNOSIS — Z882 Allergy status to sulfonamides status: Secondary | ICD-10-CM | POA: Diagnosis not present

## 2013-06-12 DIAGNOSIS — I10 Essential (primary) hypertension: Secondary | ICD-10-CM | POA: Diagnosis not present

## 2013-06-12 DIAGNOSIS — Z79899 Other long term (current) drug therapy: Secondary | ICD-10-CM | POA: Diagnosis not present

## 2013-06-12 DIAGNOSIS — E785 Hyperlipidemia, unspecified: Secondary | ICD-10-CM | POA: Diagnosis not present

## 2013-06-12 DIAGNOSIS — Z7982 Long term (current) use of aspirin: Secondary | ICD-10-CM | POA: Diagnosis not present

## 2013-06-12 DIAGNOSIS — Z880 Allergy status to penicillin: Secondary | ICD-10-CM | POA: Diagnosis not present

## 2013-06-12 DIAGNOSIS — I4510 Unspecified right bundle-branch block: Secondary | ICD-10-CM | POA: Diagnosis not present

## 2013-06-12 DIAGNOSIS — R9431 Abnormal electrocardiogram [ECG] [EKG]: Secondary | ICD-10-CM | POA: Diagnosis not present

## 2013-06-12 LAB — POCT CBC
Granulocyte percent: 65.2 %G (ref 37–80)
HCT, POC: 35.6 % — AB (ref 43.5–53.7)
Hemoglobin: 11.1 g/dL — AB (ref 14.1–18.1)
Lymph, poc: 0.9 (ref 0.6–3.4)
MCH, POC: 28.5 pg (ref 27–31.2)
MCHC: 31.3 g/dL — AB (ref 31.8–35.4)
MCV: 91.1 fL (ref 80–97)
MPV: 6.9 fL (ref 0–99.8)
POC Granulocyte: 2.2 (ref 2–6.9)
POC LYMPH PERCENT: 26.9 %L (ref 10–50)
Platelet Count, POC: 182 10*3/uL (ref 142–424)
RBC: 3.9 M/uL — AB (ref 4.69–6.13)
RDW, POC: 13.3 %
WBC: 3.4 10*3/uL — AB (ref 4.6–10.2)

## 2013-06-12 NOTE — Progress Notes (Signed)
Subjective:    Patient ID: Shawn Lambert, male    DOB: Jul 07, 1940, 73 y.o.   MRN: [id number]  HPI This 73 y.o. male presents for evaluation of blood in stool.  He has had some blood on the side of his Stool earlier this week then he had a bm with just bright red blood from rectum. He then had a few More bm's with a little blood in the stool.  He states he has not seen anymore stool.  He denies Constipation.  He denies abdominal pain.  He had a kidney stone this week which he states the pain went away.  He also mentions that he has been having chest pains but denies any at present. He states he gets electrical shock discomfort across his chest on occasion and last episode was Last night.   He states he gets a sensation of his heart flipping and it wakes him up at night.  Review of Systems C/o rectal bleeding and chest pain No SOB, HA, dizziness, vision change, N/V, diarrhea, constipation, dysuria, urinary urgency or frequency, myalgias, arthralgias or rash.     Objective:   Physical Exam Vital signs noted  Well developed well nourished male.  HEENT - Head atraumatic Normocephalic                Eyes - PERRLA, Conjuctiva - clear Sclera- Clear EOMI                Ears - EAC's Wnl TM's Wnl Gross Hearing WNL                Nose - Nares patent                 Throat - oropharanx wnl Respiratory - Lungs CTA bilateral Cardiac - RRR S1 and S2 without murmur GI - Abdomen soft Nontender and bowel sounds active x 4 Rectal - Prostate soft mildly enlarged and no masses and hemocult negative Extremities - No edema. Neuro - Grossly intact.   Results for orders placed in visit on 06/12/13  POCT CBC      Result Value Ref Range   WBC 3.4 (*) 4.6 - 10.2 K/uL   Lymph, poc 0.9  0.6 - 3.4   POC LYMPH PERCENT 26.9  10 - 50 %L   MID (cbc)    0 - 0.9   POC MID %    0 - 12 %M   POC Granulocyte 2.2  2 - 6.9   Granulocyte percent 65.2  37 - 80 %G   RBC 3.9 (*) 4.69 - 6.13 M/uL   Hemoglobin 11.1  (*) 14.1 - 18.1 g/dL   HCT, POC 35.6 (*) 43.5 - 53.7 %   MCV 91.1  80 - 97 fL   MCH, POC 28.5  27 - 31.2 pg   MCHC 31.3 (*) 31.8 - 35.4 g/dL   RDW, POC 13.3     Platelet Count, POC 182.0  142 - 424 K/uL   MPV 6.9  0 - 99.8 fL     EKG - SB with RBBB and PVC and PAC no changes since EKG in 06/16/09 Assessment & Plan:  Blood in stool - Plan: POCT CBC, Ambulatory referral to Gastroenterology Do this urgently in light of dropping from hgb 2 grams.   Chest pain - Plan: EKG 12-Lead.  He will need to get this addressed emergently.  Discussed he go to the ED and he wants to go via POV.  Strongly encouraged him  to go to the ED and get this checked out since he is having occasional chest pains and since he is having GI bleeding.  Lysbeth Penner FNP

## 2013-06-12 NOTE — Patient Instructions (Signed)
Chest Pain (Nonspecific) °It is often hard to give a specific diagnosis for the cause of chest pain. There is always a chance that your pain could be related to something serious, such as a heart attack or a blood clot in the lungs. You need to follow up with your caregiver for further evaluation. °CAUSES  °· Heartburn. °· Pneumonia or bronchitis. °· Anxiety or stress. °· Inflammation around your heart (pericarditis) or lung (pleuritis or pleurisy). °· A blood clot in the lung. °· A collapsed lung (pneumothorax). It can develop suddenly on its own (spontaneous pneumothorax) or from injury (trauma) to the chest. °· Shingles infection (herpes zoster virus). °The chest wall is composed of bones, muscles, and cartilage. Any of these can be the source of the pain. °· The bones can be bruised by injury. °· The muscles or cartilage can be strained by coughing or overwork. °· The cartilage can be affected by inflammation and become sore (costochondritis). °DIAGNOSIS  °Lab tests or other studies, such as X-rays, electrocardiography, stress testing, or cardiac imaging, may be needed to find the cause of your pain.  °TREATMENT  °· Treatment depends on what may be causing your chest pain. Treatment may include: °· Acid blockers for heartburn. °· Anti-inflammatory medicine. °· Pain medicine for inflammatory conditions. °· Antibiotics if an infection is present. °· You may be advised to change lifestyle habits. This includes stopping smoking and avoiding alcohol, caffeine, and chocolate. °· You may be advised to keep your head raised (elevated) when sleeping. This reduces the chance of acid going backward from your stomach into your esophagus. °· Most of the time, nonspecific chest pain will improve within 2 to 3 days with rest and mild pain medicine. °HOME CARE INSTRUCTIONS  °· If antibiotics were prescribed, take your antibiotics as directed. Finish them even if you start to feel better. °· For the next few days, avoid physical  activities that bring on chest pain. Continue physical activities as directed. °· Do not smoke. °· Avoid drinking alcohol. °· Only take over-the-counter or prescription medicine for pain, discomfort, or fever as directed by your caregiver. °· Follow your caregiver's suggestions for further testing if your chest pain does not go away. °· Keep any follow-up appointments you made. If you do not go to an appointment, you could develop lasting (chronic) problems with pain. If there is any problem keeping an appointment, you must call to reschedule. °SEEK MEDICAL CARE IF:  °· You think you are having problems from the medicine you are taking. Read your medicine instructions carefully. °· Your chest pain does not go away, even after treatment. °· You develop a rash with blisters on your chest. °SEEK IMMEDIATE MEDICAL CARE IF:  °· You have increased chest pain or pain that spreads to your arm, neck, jaw, back, or abdomen. °· You develop shortness of breath, an increasing cough, or you are coughing up blood. °· You have severe back or abdominal pain, feel nauseous, or vomit. °· You develop severe weakness, fainting, or chills. °· You have a fever. °THIS IS AN EMERGENCY. Do not wait to see if the pain will go away. Get medical help at once. Call your local emergency services (911 in [place] Do not drive yourself to the hospital. °MAKE SURE YOU:  °· Understand these instructions. °· Will watch your condition. °· Will get help right away if you are not doing well or get worse. °Document Released: 12/13/2004 Document Revised: 05/28/2011 Document Reviewed: 10/09/2007 °[employer]® Patient Information ©2014 [employer]   [employer]. ° °

## 2013-06-12 NOTE — Progress Notes (Signed)
   Subjective:    Patient ID: Shawn Lambert, male    DOB: 05-11-1940, 73 y.o.   MRN: [id number]  HPI    Review of Systems     Objective:   Physical Exam        Assessment & Plan:  Chest pain - His EKG is abnormal and the QRS duration is longer than it was in past.  I discussed he may be having acute coronary syndrome and needs to go to the ED via ambulance and he states he  Does not want to go to the ED and he wants refills on his medications for 90 days.  He states today Is bad day to go to ED.  Again I stress he needs to go to ED and he states he will go to [place] then walks out AMA.  Lysbeth Penner FNP

## 2013-06-15 ENCOUNTER — Telehealth: Payer: Self-pay | Admitting: Family Medicine

## 2013-06-15 DIAGNOSIS — R9431 Abnormal electrocardiogram [ECG] [EKG]: Secondary | ICD-10-CM | POA: Diagnosis not present

## 2013-06-15 DIAGNOSIS — I4510 Unspecified right bundle-branch block: Secondary | ICD-10-CM | POA: Diagnosis not present

## 2013-06-15 DIAGNOSIS — I491 Atrial premature depolarization: Secondary | ICD-10-CM | POA: Diagnosis not present

## 2013-06-15 DIAGNOSIS — R079 Chest pain, unspecified: Secondary | ICD-10-CM | POA: Diagnosis not present

## 2013-06-15 DIAGNOSIS — R9439 Abnormal result of other cardiovascular function study: Secondary | ICD-10-CM | POA: Diagnosis not present

## 2013-06-16 IMAGING — NM NM BONE 3 PHASE
1 series · 12 of 12 positions shown · non-contrast
Comparison: Bone scan dated 04/21/2008

CLINICAL DATA: Left knee pain.  Prior total knee replacement 2660.

NUCLEAR MEDICINE THREE PHASE BONE SCAN
TECHNIQUE: Radionuclide angiographic images, immediate static
blood pool images, and 3-hour delayed static images were obtained
after intravenous injection of radiopharmaceutical.
Radiopharmaceutical: [id number] RODANTHE GRNG TECHNETIUM TC 99M
MEDRONATE IV KIT

[Series 0: 3 phase bone · 9.33mm/px · 2 acquisitions, 12 frames shown]
[im 1/2  full-range]
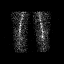
[im 1/2  full-range]
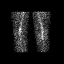
[im 1/2  full-range]
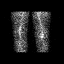
[im 1/2  full-range]
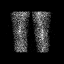
[im 1/2  full-range]
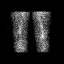
[im 1/2  full-range]
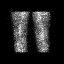
[im 2/2]
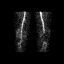
[im 2/2]
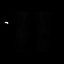
[im 2/2]
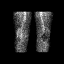
[im 2/2]
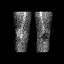
[im 2/2]
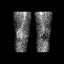
[im 2/2]
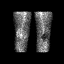

[12 of 12 positions shown; findings below may reference images not displayed]

FINDINGS: Perfusion imaging is normal.

Blood pool imaging demonstrates synovial inflammation in the right
knee.  No abnormal activity in the left knee on blood pool imaging.

Delayed bone imaging:  There is no abnormal activity in the left
knee.  There is intense activity in the patellofemoral compartment
of the right knee with less intense activity in the medial
compartment and also of the lateral tibial plateau.
IMPRESSION: 1.  Normal appearance of the left knee on triple phase bone scan
considering the presence of a total knee replacement. No evidence
of prosthesis loosening.                     2. Tricompartmental
osteoarthritis of the right knee.  This is most severe in the
patellofemoral compartment.

## 2013-06-18 DIAGNOSIS — K625 Hemorrhage of anus and rectum: Secondary | ICD-10-CM | POA: Diagnosis not present

## 2013-06-18 DIAGNOSIS — Z8371 Family history of colonic polyps: Secondary | ICD-10-CM | POA: Diagnosis not present

## 2013-06-24 DIAGNOSIS — Z7982 Long term (current) use of aspirin: Secondary | ICD-10-CM | POA: Diagnosis not present

## 2013-06-24 DIAGNOSIS — Z8371 Family history of colonic polyps: Secondary | ICD-10-CM | POA: Diagnosis not present

## 2013-06-24 DIAGNOSIS — Z79899 Other long term (current) drug therapy: Secondary | ICD-10-CM | POA: Diagnosis not present

## 2013-06-24 DIAGNOSIS — Z87442 Personal history of urinary calculi: Secondary | ICD-10-CM | POA: Diagnosis not present

## 2013-06-24 DIAGNOSIS — Z83719 Family history of colon polyps, unspecified: Secondary | ICD-10-CM | POA: Diagnosis not present

## 2013-06-24 DIAGNOSIS — K5730 Diverticulosis of large intestine without perforation or abscess without bleeding: Secondary | ICD-10-CM | POA: Diagnosis not present

## 2013-06-24 DIAGNOSIS — Z880 Allergy status to penicillin: Secondary | ICD-10-CM | POA: Diagnosis not present

## 2013-06-24 DIAGNOSIS — K921 Melena: Secondary | ICD-10-CM | POA: Diagnosis not present

## 2013-06-24 DIAGNOSIS — I10 Essential (primary) hypertension: Secondary | ICD-10-CM | POA: Diagnosis not present

## 2013-06-24 DIAGNOSIS — Z882 Allergy status to sulfonamides status: Secondary | ICD-10-CM | POA: Diagnosis not present

## 2013-06-24 DIAGNOSIS — K219 Gastro-esophageal reflux disease without esophagitis: Secondary | ICD-10-CM | POA: Diagnosis not present

## 2013-06-24 DIAGNOSIS — K625 Hemorrhage of anus and rectum: Secondary | ICD-10-CM | POA: Diagnosis not present

## 2013-06-24 DIAGNOSIS — Z96659 Presence of unspecified artificial knee joint: Secondary | ICD-10-CM | POA: Diagnosis not present

## 2013-06-24 DIAGNOSIS — D126 Benign neoplasm of colon, unspecified: Secondary | ICD-10-CM | POA: Diagnosis not present

## 2013-06-25 ENCOUNTER — Ambulatory Visit (INDEPENDENT_AMBULATORY_CARE_PROVIDER_SITE_OTHER): Payer: Medicare Other | Admitting: Family Medicine

## 2013-06-25 VITALS — BP 135/78 | HR 48 | Temp 97.1°F | Ht 68.0 in | Wt 191.0 lb

## 2013-06-25 DIAGNOSIS — R5381 Other malaise: Secondary | ICD-10-CM | POA: Diagnosis not present

## 2013-06-25 DIAGNOSIS — Z23 Encounter for immunization: Secondary | ICD-10-CM

## 2013-06-25 DIAGNOSIS — I4949 Other premature depolarization: Secondary | ICD-10-CM | POA: Diagnosis not present

## 2013-06-25 DIAGNOSIS — I452 Bifascicular block: Secondary | ICD-10-CM | POA: Diagnosis not present

## 2013-06-25 DIAGNOSIS — R350 Frequency of micturition: Secondary | ICD-10-CM

## 2013-06-25 DIAGNOSIS — H6120 Impacted cerumen, unspecified ear: Secondary | ICD-10-CM

## 2013-06-25 DIAGNOSIS — R5383 Other fatigue: Secondary | ICD-10-CM | POA: Diagnosis not present

## 2013-06-25 DIAGNOSIS — D649 Anemia, unspecified: Secondary | ICD-10-CM

## 2013-06-25 DIAGNOSIS — I493 Ventricular premature depolarization: Secondary | ICD-10-CM

## 2013-06-25 DIAGNOSIS — R079 Chest pain, unspecified: Secondary | ICD-10-CM

## 2013-06-25 LAB — POCT CBC
Granulocyte percent: 76.8 %G (ref 37–80)
HCT, POC: 37.9 % — AB (ref 43.5–53.7)
Hemoglobin: 12.3 g/dL — AB (ref 14.1–18.1)
Lymph, poc: 1 (ref 0.6–3.4)
MCH, POC: 29.3 pg (ref 27–31.2)
MCHC: 32.6 g/dL (ref 31.8–35.4)
MCV: 90 fL (ref 80–97)
MPV: 6.7 fL (ref 0–99.8)
POC Granulocyte: 4.9 (ref 2–6.9)
POC LYMPH PERCENT: 15.8 %L (ref 10–50)
Platelet Count, POC: 195 10*3/uL (ref 142–424)
RBC: 4.2 M/uL — AB (ref 4.69–6.13)
RDW, POC: 13.5 %
WBC: 6.4 10*3/uL (ref 4.6–10.2)

## 2013-06-25 NOTE — Progress Notes (Signed)
Subjective:    Patient ID: Shawn Lambert, male    DOB: 1940-05-19, 73 y.o.   MRN: [id number]  HPI Patient is here for CPE today.  He wants PSA drawn. He has no family hx or personal hx of prostate Problems.  He was recently seen in the office for chest pain and rectal bleeding and was sent to the ED.  He has had a cardiology consult and stress test and was told the results would be sent to his pcp. He has had colonoscopy and had 2 polyps removed and was told his rectal bleeding was due to polyps.He states the ED has told him he did not have an MI.  He had EKG changes showing new RBBB and frequent PVC's.   He has had cardiolite stress test, negative CE's x 3, and had colonoscopy By GI.  He still has some vague complaints of chest pain on occasion.   Review of Systems C/o chest pain   No chest pain, SOB, HA, dizziness, vision change, N/V, diarrhea, constipation, dysuria, urinary urgency or frequency, myalgias, arthralgias or rash.  Objective:   Physical Exam   Vital signs noted  Well developed well nourished male.  HEENT - Head atraumatic Normocephalic                Eyes - PERRLA, Conjuctiva - clear Sclera- Clear EOMI                Ears - EAC's with cerumen impaction and clear after irrigation TM's Wnl Gross Hearing WNL                Nose - Nares patent                 Throat - oropharanx wnl Respiratory - Lungs CTA bilateral Cardiac - RRR S1 and S2 without murmur GI - Abdomen soft Nontender and bowel sounds active x 4 Extremities - No edema. Neuro - Grossly intact. Results for orders placed in visit on 06/25/13  POCT CBC      Result Value Ref Range   WBC 6.4  4.6 - 10.2 K/uL   Lymph, poc 1.0  0.6 - 3.4   POC LYMPH PERCENT 15.8  10 - 50 %L   POC Granulocyte 4.9  2 - 6.9   Granulocyte percent 76.8  37 - 80 %G   RBC 4.2 (*) 4.69 - 6.13 M/uL   Hemoglobin 12.3 (*) 14.1 - 18.1 g/dL   HCT, POC 37.9 (*) 43.5 - 53.7 %   MCV 90.0  80 - 97 fL   MCH, POC 29.3  27 - 31.2 pg   MCHC 32.6  31.8 - 35.4 g/dL   RDW, POC 13.5     Platelet Count, POC 195.0  142 - 424 K/uL   MPV 6.7  0 - 99.8 fL      Assessment & Plan:  Cerumen impaction - Plan: Irrigate ears and ears are clear after irrigation  Urine frequency - Plan: PSA, total and free  Other malaise and fatigue - Plan: Varicella-zoster vaccine subcutaneous, CMP14+EGFR, Thyroid Panel With TSH  Anemia - Plan: POCT CBC.  Most likely due bleeding from polyps.  Chest pain - Plan: CMP14+EGFR, Thyroid Panel With TSH.  He has had negative CE's in the ED And still pending Cardiolite stress test results from [place].  Refer to Cardiology in [place].  He had NSR on Ekg 3 years ago and now has new RBBB on his EKG with PAC's.  Polyposis -  Await biopsy results.  Diverticulosis - Discussed not eating food with seeds.  Frequent PVCs - Plan: POCT CBC, CMP14+EGFR, Thyroid Panel With TSH.  Spent over 45 minutes with patient in visit.  Lysbeth Penner FNP

## 2013-06-26 LAB — PSA, TOTAL AND FREE
PSA, Free Pct: 44.4 %
PSA, Free: 0.4 ng/mL
PSA: 0.9 ng/mL (ref 0.0–4.0)

## 2013-06-26 LAB — THYROID PANEL WITH TSH
Free Thyroxine Index: 2.1 (ref 1.2–4.9)
T3 Uptake Ratio: 27 % (ref 24–39)
T4, Total: 7.8 ug/dL (ref 4.5–12.0)
TSH: 2.44 u[IU]/mL (ref 0.450–4.500)

## 2013-06-26 LAB — CMP14+EGFR
ALT: 25 IU/L (ref 0–44)
AST: 23 IU/L (ref 0–40)
Albumin/Globulin Ratio: 1.9 (ref 1.1–2.5)
Albumin: 4.1 g/dL (ref 3.5–4.8)
Alkaline Phosphatase: 96 IU/L (ref 39–117)
BUN/Creatinine Ratio: 17 (ref 10–22)
BUN: 17 mg/dL (ref 8–27)
CO2: 25 mmol/L (ref 18–29)
Calcium: 9.4 mg/dL (ref 8.6–10.2)
Chloride: 104 mmol/L (ref 97–108)
Creatinine, Ser: 0.98 mg/dL (ref 0.76–1.27)
GFR calc Af Amer: 89 mL/min/{1.73_m2} (ref >59)
GFR calc non Af Amer: 77 mL/min/{1.73_m2} (ref >59)
Globulin, Total: 2.2 g/dL (ref 1.5–4.5)
Glucose: 103 mg/dL — ABNORMAL HIGH (ref 65–99)
Potassium: 4.9 mmol/L (ref 3.5–5.2)
Sodium: 143 mmol/L (ref 134–144)
Total Bilirubin: 0.4 mg/dL (ref 0.0–1.2)
Total Protein: 6.3 g/dL (ref 6.0–8.5)

## 2013-06-26 NOTE — Addendum Note (Signed)
Addended by: Marin Olp on: 06/26/2013 10:36 AM   Modules accepted: Orders

## 2013-07-07 ENCOUNTER — Encounter: Payer: Self-pay | Admitting: *Deleted

## 2013-07-08 ENCOUNTER — Encounter: Payer: Self-pay | Admitting: Cardiovascular Disease

## 2013-07-08 ENCOUNTER — Ambulatory Visit (INDEPENDENT_AMBULATORY_CARE_PROVIDER_SITE_OTHER): Payer: Medicare Other | Admitting: Cardiovascular Disease

## 2013-07-08 VITALS — BP 139/86 | HR 74 | Ht 67.0 in | Wt 194.0 lb

## 2013-07-08 DIAGNOSIS — K219 Gastro-esophageal reflux disease without esophagitis: Secondary | ICD-10-CM | POA: Diagnosis not present

## 2013-07-08 DIAGNOSIS — Z136 Encounter for screening for cardiovascular disorders: Secondary | ICD-10-CM

## 2013-07-08 DIAGNOSIS — I452 Bifascicular block: Secondary | ICD-10-CM

## 2013-07-08 DIAGNOSIS — IMO0001 Reserved for inherently not codable concepts without codable children: Secondary | ICD-10-CM

## 2013-07-08 DIAGNOSIS — I10 Essential (primary) hypertension: Secondary | ICD-10-CM | POA: Diagnosis not present

## 2013-07-08 DIAGNOSIS — R079 Chest pain, unspecified: Secondary | ICD-10-CM | POA: Diagnosis not present

## 2013-07-08 NOTE — Progress Notes (Signed)
Patient ID: Shawn Lambert, male   DOB: February 15, 1941, 73 y.o.   MRN: [id number]       CARDIOLOGY CONSULT NOTE  Patient ID: Shawn Lambert MRN: [id number] DOB/AGE: 1940/08/10 73 y.o.  Admit date: (Not on file) Primary Physician Redge Gainer, MD  Reason for Consultation: chest pain, abnormal ECG  HPI: The patient is a 73 year old male with a history of hypertension, hyperlipidemia and GERD. He had been having chest pain and presented to the [facility] ED. He ruled out for an acute coronary syndrome and underwent a Lexiscan Cardiolite stress test which was negative for ischemia and revealed normal myocardial perfusion with some mild inferior soft tissue attenuation artifact. An ECG on 06/12/2013 showed normal sinus rhythm, heart rate 59 beats per minute, right bundle branch block, left anterior fascicular block, and an isolated PVC. An echocardiogram performed in 06/2009 showed normal LV systolic function, EF 66-06%, and mild LVH.  He stays [place] and walks quite a bit, and denies any exertional chest pain. Only if he has to climb up a steep hill may he experience some mild dyspnea with exertion. He denies palpitations and syncope. Last year he lost 30 pounds but then put it back on, and he wants to lose this weight and keep it off. He has been taking a baby aspirin daily for several years but is prone to bleeding.  Allergies  Allergen Reactions  . Penicillins   . Sulfonamide Derivatives     REACTION: Reaction not known    Current Outpatient Prescriptions  Medication Sig Dispense Refill  . amLODipine (NORVASC) 5 MG tablet Take 1 tablet (5 mg total) by mouth daily.  90 tablet  3  . aspirin EC 81 MG tablet Take 81 mg by mouth daily.      Marland Kitchen atorvastatin (LIPITOR) 20 MG tablet Take 1 tablet (20 mg total) by mouth daily.  90 tablet  3  . Calcium Carbonate-Vitamin D (CALCIUM-VITAMIN D) 600-200 MG-UNIT CAPS Take 2 tablets by mouth daily.      . cholecalciferol  (VITAMIN D) 1000 UNITS tablet Take 1,000 Units by mouth daily.      . fish oil-omega-3 fatty acids 1000 MG capsule Take 2 g by mouth daily.      Marland Kitchen glucosamine-chondroitin 500-400 MG tablet Take 2 tablets by mouth daily.      . meloxicam (MOBIC) 15 MG tablet Take 1 tablet (15 mg total) by mouth daily.  90 tablet  3  . Multiple Vitamins-Minerals ([employer]S SPECTRAVITE ADULT 50+ PO) Take 1 tablet by mouth daily.      Marland Kitchen NITROSTAT 0.4 MG SL tablet Place 0.4 mg under the tongue every 5 (five) minutes as needed.       Marland Kitchen omeprazole (PRILOSEC) 40 MG capsule Take 1 capsule (40 mg total) by mouth daily.  90 capsule  3  . vitamin A 8000 UNIT capsule Take 8,000 Units by mouth daily.      . vitamin E 400 UNIT capsule Take 400 Units by mouth daily.       No current facility-administered medications for this visit.    Past Medical History  Diagnosis Date  . Kidney stones     Past Surgical History  Procedure Laterality Date  . Total knee arthroplasty    . Eye surgery Right 1965    Prosthetic Eye    History   Social History  . Marital Status: Married    Spouse Name: N/A    Number  of Children: N/A  . Years of Education: N/A   Occupational History  . Not on file.   Social History Main Topics  . Smoking status: Never Smoker   . Smokeless tobacco: Never Used  . Alcohol Use: No  . Drug Use: No  . Sexual Activity: Not on file   Other Topics Concern  . Not on file   Social History Narrative  . No narrative on file     No family history of premature CAD in 1st degree relatives.  Prior to Admission medications   Medication Sig Start Date End Date Taking? Authorizing Provider  amLODipine (NORVASC) 5 MG tablet Take 1 tablet (5 mg total) by mouth daily. 12/08/12  Yes Lysbeth Penner, FNP  aspirin EC 81 MG tablet Take 81 mg by mouth daily.   Yes Historical Provider, MD  atorvastatin (LIPITOR) 20 MG tablet Take 1 tablet (20 mg total) by mouth daily. 12/08/12  Yes Lysbeth Penner, FNP  Calcium  Carbonate-Vitamin D (CALCIUM-VITAMIN D) 600-200 MG-UNIT CAPS Take 2 tablets by mouth daily.   Yes Historical Provider, MD  cholecalciferol (VITAMIN D) 1000 UNITS tablet Take 1,000 Units by mouth daily.   Yes Historical Provider, MD  fish oil-omega-3 fatty acids 1000 MG capsule Take 2 g by mouth daily.   Yes Historical Provider, MD  glucosamine-chondroitin 500-400 MG tablet Take 2 tablets by mouth daily.   Yes Historical Provider, MD  meloxicam (MOBIC) 15 MG tablet Take 1 tablet (15 mg total) by mouth daily. 12/08/12  Yes Lysbeth Penner, FNP  Multiple Vitamins-Minerals (CVS SPECTRAVITE ADULT 50+ PO) Take 1 tablet by mouth daily.   Yes Historical Provider, MD  NITROSTAT 0.4 MG SL tablet Place 0.4 mg under the tongue every 5 (five) minutes as needed.  06/17/13  Yes Historical Provider, MD  omeprazole (PRILOSEC) 40 MG capsule Take 1 capsule (40 mg total) by mouth daily. 12/08/12  Yes Lysbeth Penner, FNP  vitamin A 8000 UNIT capsule Take 8,000 Units by mouth daily.   Yes Historical Provider, MD  vitamin E 400 UNIT capsule Take 400 Units by mouth daily.   Yes Historical Provider, MD     Review of systems complete and found to be negative unless listed above in HPI     Physical exam Blood pressure 139/86, pulse 74, height 5\' 7"  (1.702 m), weight 194 lb (87.998 kg). General: NAD Neck: No JVD, no thyromegaly or thyroid nodule.  Lungs: Clear to auscultation bilaterally with normal respiratory effort. CV: Nondisplaced PMI.  Heart regular S1/S2, no S3/S4, no murmur.  No peripheral edema.  No carotid bruit.  Normal pedal pulses.  Abdomen: Soft, nontender, no hepatosplenomegaly, no distention.  Skin: Intact without lesions or rashes.  Neurologic: Alert and oriented x 3.  Psych: Normal affect. Extremities: No clubbing or cyanosis.  HEENT: Normal.   Labs:   Lab Results  Component Value Date   WBC 6.4 06/25/2013   HGB 12.3* 06/25/2013   HCT 37.9* 06/25/2013   MCV 90.0 06/25/2013   No results found for  this basename: NA, K, CL, CO2, BUN, CREATININE, CALCIUM, LABALBU, PROT, BILITOT, ALKPHOS, ALT, AST, GLUCOSE,  in the last 168 hours No results found for this basename: CKTOTAL, CKMB, CKMBINDEX, TROPONINI    No results found for this basename: CHOL   Lab Results  Component Value Date   HDL 49 12/08/2012   Lab Results  Component Value Date   LDLCALC 91 12/08/2012   Lab Results  Component Value Date  TRIG 58 12/08/2012   Lab Results  Component Value Date   CHOLHDL 3.1 12/08/2012   No results found for this basename: LDLDIRECT         Studies: See HPI  ASSESSMENT AND PLAN: 1. Chest pain in the setting of a normal nuclear myocardial perfusion study: Given that this pain only lasts seconds and is nonexertional, it may be related to his GERD, as it appears to be more positional as he experiences it more often in the supine position. Given the risk-benefit ratio, it is not necessary to take a daily aspirin. I told him to use his sublingual nitroglycerin only if he experiences chest pain which lasts several minutes and is not relieved with his usual remedies for GERD.  2. RBBB and LAFB: He denies palpitations and syncope. He only has dizziness intermittently when he stands up at first in the morning. He stays active and denies exertional chest pain and shortness of breath. I will simply monitor this.  3. HTN: Controlled on present therapy.  Dispo: f/u 1 year.  Signed: Kate Sable, M.D., F.A.C.C.  07/08/2013, 8:48 AM

## 2013-07-08 NOTE — Patient Instructions (Signed)
Your physician recommends that you schedule a follow-up appointment in: 1 year with Dr. Bronson Ing. You should receive a letter in the mail in 10 months. If you do not receive this letter by February 2016 call our office to schedule this appointment.   Your physician has recommended you make the following change in your medication:  Stop Aspirin  Continue all other medications the same.

## 2013-07-28 ENCOUNTER — Other Ambulatory Visit: Payer: Self-pay | Admitting: Family Medicine

## 2013-09-07 ENCOUNTER — Other Ambulatory Visit: Payer: Self-pay | Admitting: Family Medicine

## 2013-10-31 ENCOUNTER — Other Ambulatory Visit: Payer: Self-pay | Admitting: Family Medicine

## 2013-11-02 NOTE — Telephone Encounter (Signed)
Last ov 4/15. 

## 2013-11-09 DIAGNOSIS — Z85828 Personal history of other malignant neoplasm of skin: Secondary | ICD-10-CM | POA: Diagnosis not present

## 2013-11-09 DIAGNOSIS — L570 Actinic keratosis: Secondary | ICD-10-CM | POA: Diagnosis not present

## 2014-02-27 ENCOUNTER — Other Ambulatory Visit: Payer: Self-pay | Admitting: Family Medicine

## 2014-03-01 NOTE — Telephone Encounter (Signed)
Last ov 4/15. Confirmed with pt he wants mail order.

## 2014-05-10 DIAGNOSIS — L821 Other seborrheic keratosis: Secondary | ICD-10-CM | POA: Diagnosis not present

## 2014-05-10 DIAGNOSIS — L570 Actinic keratosis: Secondary | ICD-10-CM | POA: Diagnosis not present

## 2014-05-10 DIAGNOSIS — D485 Neoplasm of uncertain behavior of skin: Secondary | ICD-10-CM | POA: Diagnosis not present

## 2014-05-10 DIAGNOSIS — C44519 Basal cell carcinoma of skin of other part of trunk: Secondary | ICD-10-CM | POA: Diagnosis not present

## 2014-05-10 DIAGNOSIS — Z85828 Personal history of other malignant neoplasm of skin: Secondary | ICD-10-CM | POA: Diagnosis not present

## 2014-05-27 DIAGNOSIS — C44519 Basal cell carcinoma of skin of other part of trunk: Secondary | ICD-10-CM | POA: Diagnosis not present

## 2014-06-09 DIAGNOSIS — Z4802 Encounter for removal of sutures: Secondary | ICD-10-CM | POA: Diagnosis not present

## 2014-06-13 ENCOUNTER — Other Ambulatory Visit: Payer: Self-pay | Admitting: Family Medicine

## 2014-08-19 ENCOUNTER — Encounter: Payer: Self-pay | Admitting: *Deleted

## 2014-08-23 ENCOUNTER — Encounter: Payer: Self-pay | Admitting: Cardiovascular Disease

## 2014-08-23 ENCOUNTER — Ambulatory Visit (INDEPENDENT_AMBULATORY_CARE_PROVIDER_SITE_OTHER): Payer: Medicare Other | Admitting: Cardiovascular Disease

## 2014-08-23 VITALS — BP 140/70 | HR 61 | Ht 68.0 in | Wt 192.0 lb

## 2014-08-23 DIAGNOSIS — I452 Bifascicular block: Secondary | ICD-10-CM

## 2014-08-23 DIAGNOSIS — I119 Hypertensive heart disease without heart failure: Secondary | ICD-10-CM

## 2014-08-23 DIAGNOSIS — R079 Chest pain, unspecified: Secondary | ICD-10-CM | POA: Diagnosis not present

## 2014-08-23 DIAGNOSIS — I10 Essential (primary) hypertension: Secondary | ICD-10-CM

## 2014-08-23 MED ORDER — NITROGLYCERIN 0.4 MG SL SUBL: 0.4000 mg | SUBLINGUAL_TABLET | SUBLINGUAL | Status: DC | PRN

## 2014-08-23 NOTE — Progress Notes (Signed)
Patient ID: Shawn Lambert, male   DOB: October 31, 1940, 74 y.o.   MRN: [id number]      SUBJECTIVE: Mr. Sedor returns for routine follow-up. I evaluated him in April 2015 for chest pain. He underwent a low risk nuclear stress test. He has a history of hypertension, hyperlipidemia, and GERD. He has a baseline right bundle branch block and left anterior fascicular block.  He very seldom has precordial chest pains which are occasionally alleviated with changing position. He denies exertional chest pain and shortness of breath altogether. He also denies lightheadedness, dizziness, and syncope. He has a prior history of left knee replacement and says his right knee also needs intervention. He farms cattle and stays very active.  He has not had to use sublingual nitroglycerin in the past year.   Review of Systems: As per "subjective", otherwise negative.  Allergies  Allergen Reactions  . Penicillins   . Sulfonamide Derivatives     REACTION: Reaction not known    Current Outpatient Prescriptions  Medication Sig Dispense Refill  . amLODipine (NORVASC) 5 MG tablet TAKE 1 TABLET DAILY 90 tablet 0  . aspirin 81 MG tablet Take 81 mg by mouth daily.    Marland Kitchen atorvastatin (LIPITOR) 20 MG tablet TAKE 1 TABLET DAILY 90 tablet 3  . Calcium Carbonate-Vitamin D (CALCIUM-VITAMIN D) 600-200 MG-UNIT CAPS Take 2 tablets by mouth daily.    . cholecalciferol (VITAMIN D) 1000 UNITS tablet Take 1,000 Units by mouth daily.    . fish oil-omega-3 fatty acids 1000 MG capsule Take 2 g by mouth daily.    Marland Kitchen glucosamine-chondroitin 500-400 MG tablet Take 2 tablets by mouth daily.    . meloxicam (MOBIC) 15 MG tablet TAKE 1 TABLET DAILY 90 tablet 3  . Multiple Vitamins-Minerals (CVS SPECTRAVITE ADULT 50+ PO) Take 1 tablet by mouth daily.    Marland Kitchen NITROSTAT 0.4 MG SL tablet Place 0.4 mg under the tongue every 5 (five) minutes as needed.     Marland Kitchen omeprazole (PRILOSEC) 40 MG capsule TAKE 1 CAPSULE DAILY 90 capsule 3  . vitamin A 8000  UNIT capsule Take 8,000 Units by mouth daily.    . vitamin E 400 UNIT capsule Take 400 Units by mouth daily.     No current facility-administered medications for this visit.    Past Medical History  Diagnosis Date  . Kidney stones     Past Surgical History  Procedure Laterality Date  . Total knee arthroplasty    . Eye surgery Right 1965    Prosthetic Eye    History   Social History  . Marital Status: Married    Spouse Name: N/A  . Number of Children: N/A  . Years of Education: N/A   Occupational History  . Not on file.   Social History Main Topics  . Smoking status: Never Smoker   . Smokeless tobacco: Never Used  . Alcohol Use: No  . Drug Use: No  . Sexual Activity: Not on file   Other Topics Concern  . Not on file   Social History Narrative     Filed Vitals:   08/23/14 1444  BP: 140/70  Pulse: 61  Height: 5\' 8"  (1.727 m)  Weight: 192 lb (87.091 kg)  SpO2: 97%    PHYSICAL EXAM General: NAD HEENT: Normal. Neck: No JVD, no thyromegaly. Lungs: Clear to auscultation bilaterally with normal respiratory effort. CV: Nondisplaced PMI.  Regular rate and rhythm, normal S1/S2, no F0/Y7, soft 1/6 systolic murmur along left sternal border.  Abdomen: Soft, nontender, no distention.  Neurologic: Alert and oriented x 3.  Psych: Normal affect. Skin: Normal. Musculoskeletal: No gross deformities. Extremities: No clubbing or cyanosis.   ECG: Most recent ECG reviewed.      ASSESSMENT AND PLAN: 1. Chest pain: Infrequent and nonexertional. Encouraged to take sublingual nitroglycerin as needed, which I will refill.  2. Essential HTN: Reasonably controlled. No changes.  3. RBBB and LAFB: He denies palpitations and syncope. He only has dizziness intermittently when he stands up at first in the morning. He stays active and denies exertional chest pain and shortness of breath. I will simply monitor this.   Dispo: He requests annual follow-up. Will see back in 1  year or sooner if need be.   Kate Sable, M.D., F.A.C.C.

## 2014-08-23 NOTE — Patient Instructions (Signed)
Your physician wants you to follow-up in: 1 year with Dr. Virgina Jock will receive a reminder letter in the mail two months in advance. If you don't receive a letter, please call our office to schedule the follow-up appointment.  Your physician recommends that you continue on your current medications as directed. Please refer to the Current Medication list given to you today.  WE HAVE REFILLED NITROGLYCERIN  Thank you for choosing [facility]!!

## 2014-11-08 DIAGNOSIS — L570 Actinic keratosis: Secondary | ICD-10-CM | POA: Diagnosis not present

## 2014-11-08 DIAGNOSIS — Z85828 Personal history of other malignant neoplasm of skin: Secondary | ICD-10-CM | POA: Diagnosis not present

## 2014-11-09 DIAGNOSIS — M25562 Pain in left knee: Secondary | ICD-10-CM | POA: Diagnosis not present

## 2014-11-09 DIAGNOSIS — M25561 Pain in right knee: Secondary | ICD-10-CM | POA: Diagnosis not present

## 2014-11-09 DIAGNOSIS — M1711 Unilateral primary osteoarthritis, right knee: Secondary | ICD-10-CM | POA: Diagnosis not present

## 2014-11-09 DIAGNOSIS — R262 Difficulty in walking, not elsewhere classified: Secondary | ICD-10-CM | POA: Diagnosis not present

## 2014-11-09 DIAGNOSIS — M25571 Pain in right ankle and joints of right foot: Secondary | ICD-10-CM | POA: Diagnosis not present

## 2014-11-09 DIAGNOSIS — M170 Bilateral primary osteoarthritis of knee: Secondary | ICD-10-CM | POA: Diagnosis not present

## 2014-11-18 DIAGNOSIS — M1711 Unilateral primary osteoarthritis, right knee: Secondary | ICD-10-CM | POA: Diagnosis not present

## 2014-11-18 DIAGNOSIS — M25561 Pain in right knee: Secondary | ICD-10-CM | POA: Diagnosis not present

## 2014-11-18 DIAGNOSIS — R2689 Other abnormalities of gait and mobility: Secondary | ICD-10-CM | POA: Diagnosis not present

## 2014-12-02 DIAGNOSIS — M1711 Unilateral primary osteoarthritis, right knee: Secondary | ICD-10-CM | POA: Diagnosis not present

## 2014-12-02 DIAGNOSIS — M25561 Pain in right knee: Secondary | ICD-10-CM | POA: Diagnosis not present

## 2014-12-07 DIAGNOSIS — M25561 Pain in right knee: Secondary | ICD-10-CM | POA: Diagnosis not present

## 2014-12-07 DIAGNOSIS — M1711 Unilateral primary osteoarthritis, right knee: Secondary | ICD-10-CM | POA: Diagnosis not present

## 2014-12-14 DIAGNOSIS — M1711 Unilateral primary osteoarthritis, right knee: Secondary | ICD-10-CM | POA: Diagnosis not present

## 2014-12-14 DIAGNOSIS — M25561 Pain in right knee: Secondary | ICD-10-CM | POA: Diagnosis not present

## 2015-02-28 ENCOUNTER — Ambulatory Visit (INDEPENDENT_AMBULATORY_CARE_PROVIDER_SITE_OTHER): Payer: Medicare Other | Admitting: Family Medicine

## 2015-02-28 ENCOUNTER — Encounter: Payer: Self-pay | Admitting: Family Medicine

## 2015-02-28 VITALS — BP 161/86 | HR 60 | Temp 97.0°F | Ht 68.0 in | Wt 197.2 lb

## 2015-02-28 DIAGNOSIS — E785 Hyperlipidemia, unspecified: Secondary | ICD-10-CM | POA: Diagnosis not present

## 2015-02-28 DIAGNOSIS — R351 Nocturia: Secondary | ICD-10-CM | POA: Diagnosis not present

## 2015-02-28 DIAGNOSIS — M170 Bilateral primary osteoarthritis of knee: Secondary | ICD-10-CM

## 2015-02-28 DIAGNOSIS — K219 Gastro-esophageal reflux disease without esophagitis: Secondary | ICD-10-CM

## 2015-02-28 DIAGNOSIS — I119 Hypertensive heart disease without heart failure: Secondary | ICD-10-CM | POA: Diagnosis not present

## 2015-02-28 DIAGNOSIS — Z23 Encounter for immunization: Secondary | ICD-10-CM

## 2015-02-28 MED ORDER — AMLODIPINE BESYLATE 5 MG PO TABS: 5.0000 mg | ORAL_TABLET | Freq: Every day | ORAL | Status: DC

## 2015-02-28 MED ORDER — NITROGLYCERIN 0.4 MG SL SUBL: 0.4000 mg | SUBLINGUAL_TABLET | SUBLINGUAL | Status: DC | PRN

## 2015-02-28 MED ORDER — MELOXICAM 15 MG PO TABS: 15.0000 mg | ORAL_TABLET | Freq: Every day | ORAL | Status: DC

## 2015-02-28 MED ORDER — ATORVASTATIN CALCIUM 20 MG PO TABS: 20.0000 mg | ORAL_TABLET | Freq: Every day | ORAL | Status: DC

## 2015-02-28 MED ORDER — OMEPRAZOLE 40 MG PO CPDR: 40.0000 mg | DELAYED_RELEASE_CAPSULE | Freq: Every day | ORAL | Status: DC

## 2015-02-28 NOTE — Progress Notes (Signed)
BP 161/86 mmHg  Pulse 60  Temp(Src) 97 F (36.1 C) (Oral)  Ht '5\' 8"'  (1.727 m)  Wt 197 lb 3.2 oz (89.449 kg)  BMI 29.99 kg/m2   Subjective:    Patient ID: Shawn Lambert, male    DOB: 12-29-1940, 74 y.o.   MRN: [id number]  HPI: Shawn Lambert is a 74 y.o. male presenting on 02/28/2015 for Annual Exam   HPI Hypertension Patient has been on Norvasc but currently ran out of it. His blood pressures elevated at 161/86 but that is likely because of his not having his blood pressure medication. He denies any chest pain, blurred vision, lightheadedness or dizziness or focal numbness or weakness. He denies any major side effects from medication.  Hyperlipidemia Patient is on a moderate dose of a statin and is due for recheck of his cholesterol. He denies any issues with myalgias or liver problems from the medication.  Nocturia Patient denies any major stream issues but does have to urinate more frequently. He gets up at least 2 times every night to urinate. He denies any dysuria or hematuria.  Knee arthritis Patient has arthritis of both knees. He had his left knee replaced a few years ago but they told him that they were not able to replace the inner part of his knee And that is where his pain is coming from. He has significant right knee arthritis and has an appointment to see orthopedics tomorrow.  GERD Patient has been on omeprazole for his reflux and has been doing well on that medication. He denies any abdominal pain or heartburn symptoms.  Relevant past medical, surgical, family and social history reviewed and updated as indicated. Interim medical history since our last visit reviewed. Allergies and medications reviewed and updated.  Review of Systems  Constitutional: Negative for fever and chills.  HENT: Negative for ear discharge and ear pain.   Eyes: Negative for discharge and visual disturbance.  Respiratory: Negative for shortness of breath and wheezing.   Cardiovascular:  Negative for chest pain and leg swelling.  Gastrointestinal: Negative for abdominal pain, diarrhea, constipation and abdominal distention.  Genitourinary: Positive for frequency. Negative for dysuria, hematuria, flank pain and difficulty urinating.  Musculoskeletal: Positive for joint swelling, arthralgias and gait problem. Negative for back pain.  Skin: Negative for rash.  Neurological: Negative for syncope, light-headedness and headaches.  All other systems reviewed and are negative.   Per HPI unless specifically indicated above     Medication List       This list is accurate as of: 02/28/15 11:28 AM.  Always use your most recent med list.               amLODipine 5 MG tablet  Commonly known as:  NORVASC  Take 1 tablet (5 mg total) by mouth daily.     aspirin 81 MG tablet  Take 81 mg by mouth daily.     atorvastatin 20 MG tablet  Commonly known as:  LIPITOR  Take 1 tablet (20 mg total) by mouth daily.     Calcium-Vitamin D 600-200 MG-UNIT Caps  Take 2 tablets by mouth daily.     cholecalciferol 1000 UNITS tablet  Commonly known as:  VITAMIN D  Take 1,000 Units by mouth daily.     CVS SPECTRAVITE ADULT 50+ PO  Take 1 tablet by mouth daily.     fish oil-omega-3 fatty acids 1000 MG capsule  Take 2 g by mouth daily.     glucosamine-chondroitin  500-400 MG tablet  Take 2 tablets by mouth daily.     meloxicam 15 MG tablet  Commonly known as:  MOBIC  Take 1 tablet (15 mg total) by mouth daily.     nitroGLYCERIN 0.4 MG SL tablet  Commonly known as:  NITROSTAT  Place 1 tablet (0.4 mg total) under the tongue every 5 (five) minutes as needed.     omeprazole 40 MG capsule  Commonly known as:  PRILOSEC  Take 1 capsule (40 mg total) by mouth daily.     vitamin E 400 UNIT capsule  Take 400 Units by mouth daily.           Objective:    BP 161/86 mmHg  Pulse 60  Temp(Src) 97 F (36.1 C) (Oral)  Ht '5\' 8"'  (1.727 m)  Wt 197 lb 3.2 oz (89.449 kg)  BMI 29.99  kg/m2  Wt Readings from Last 3 Encounters:  02/28/15 197 lb 3.2 oz (89.449 kg)  08/23/14 192 lb (87.091 kg)  07/08/13 194 lb (87.998 kg)    Physical Exam  Constitutional: He is oriented to person, place, and time. He appears well-developed and well-nourished. No distress.  HENT:  Right Ear: External ear normal.  Left Ear: External ear normal.  Nose: Nose normal.  Mouth/Throat: Oropharynx is clear and moist.  Eyes: Conjunctivae and EOM are normal. Pupils are equal, round, and reactive to light. Right eye exhibits no discharge. No scleral icterus.  Neck: Neck supple. No thyromegaly present.  Cardiovascular: Normal rate, regular rhythm, normal heart sounds and intact distal pulses.   No murmur heard. Pulmonary/Chest: Effort normal and breath sounds normal. No respiratory distress. He has no wheezes.  Abdominal: Soft. Bowel sounds are normal. He exhibits no distension and no mass. There is no tenderness. There is no rebound.  Musculoskeletal: Normal range of motion. He exhibits no edema.  Bilateral knee pain with patellar grinding bilaterally. Pain is more anterior on both knees. He has a mild effusion worse on the right knee than left. No erythema or warmth noted.  Lymphadenopathy:    He has no cervical adenopathy.  Neurological: He is alert and oriented to person, place, and time. Coordination normal.  Skin: Skin is warm and dry. No rash noted. He is not diaphoretic.  Psychiatric: He has a normal mood and affect. His behavior is normal.  Vitals reviewed.   Results for orders placed or performed in visit on 06/25/13  CMP14+EGFR  Result Value Ref Range   Glucose 103 (H) 65 - 99 mg/dL   BUN 17 8 - 27 mg/dL   Creatinine, Ser 0.98 0.76 - 1.27 mg/dL   GFR calc non Af Amer 77 >59 mL/min/1.73   GFR calc Af Amer 89 >59 mL/min/1.73   BUN/Creatinine Ratio 17 10 - 22   Sodium 143 134 - 144 mmol/L   Potassium 4.9 3.5 - 5.2 mmol/L   Chloride 104 97 - 108 mmol/L   CO2 25 18 - 29 mmol/L    Calcium 9.4 8.6 - 10.2 mg/dL   Total Protein 6.3 6.0 - 8.5 g/dL   Albumin 4.1 3.5 - 4.8 g/dL   Globulin, Total 2.2 1.5 - 4.5 g/dL   Albumin/Globulin Ratio 1.9 1.1 - 2.5   Total Bilirubin 0.4 0.0 - 1.2 mg/dL   Alkaline Phosphatase 96 39 - 117 IU/L   AST 23 0 - 40 IU/L   ALT 25 0 - 44 IU/L  PSA, total and free  Result Value Ref Range   PSA 0.9  0.0 - 4.0 ng/mL   PSA, Free 0.40 N/A ng/mL   PSA, Free Pct 44.4 %  Thyroid Panel With TSH  Result Value Ref Range   TSH 2.440 0.450 - 4.500 uIU/mL   T4, Total 7.8 4.5 - 12.0 ug/dL   T3 Uptake Ratio 27 24 - 39 %   Free Thyroxine Index 2.1 1.2 - 4.9  POCT CBC  Result Value Ref Range   WBC 6.4 4.6 - 10.2 K/uL   Lymph, poc 1.0 0.6 - 3.4   POC LYMPH PERCENT 15.8 10 - 50 %L   POC Granulocyte 4.9 2 - 6.9   Granulocyte percent 76.8 37 - 80 %G   RBC 4.2 (A) 4.69 - 6.13 M/uL   Hemoglobin 12.3 (A) 14.1 - 18.1 g/dL   HCT, POC 37.9 (A) 43.5 - 53.7 %   MCV 90.0 80 - 97 fL   MCH, POC 29.3 27 - 31.2 pg   MCHC 32.6 31.8 - 35.4 g/dL   RDW, POC 13.5 %   Platelet Count, POC 195.0 142 - 424 K/uL   MPV 6.7 0 - 99.8 fL      Assessment & Plan:   Problem List Items Addressed This Visit      Cardiovascular and Mediastinum   HYPERTENSION, HEART CONTROLLED W/O ASSOC CHF   Relevant Medications   nitroGLYCERIN (NITROSTAT) 0.4 MG SL tablet   amLODipine (NORVASC) 5 MG tablet   atorvastatin (LIPITOR) 20 MG tablet   Other Relevant Orders   CMP14+EGFR     Digestive   GERD   Relevant Medications   omeprazole (PRILOSEC) 40 MG capsule     Musculoskeletal and Integument   Osteoarthritis of both knees   Relevant Medications   meloxicam (MOBIC) 15 MG tablet     Other   Hyperlipidemia LDL goal <130   Relevant Medications   nitroGLYCERIN (NITROSTAT) 0.4 MG SL tablet   amLODipine (NORVASC) 5 MG tablet   atorvastatin (LIPITOR) 20 MG tablet   Other Relevant Orders   Lipid panel    Other Visit Diagnoses    Nocturia    -  Primary    Relevant Orders     PSA, total and free    Encounter for immunization        Relevant Orders    Pneumococcal conjugate vaccine 13-valent (Completed)        Follow up plan: Return in about 4 weeks (around 03/28/2015), or if symptoms worsen or fail to improve, for Hypertension recheck.  Counseling provided for all of the vaccine components Orders Placed This Encounter  Procedures  . Pneumococcal conjugate vaccine 13-valent  . CMP14+EGFR  . Lipid panel  . PSA, total and free    Caryl Pina, MD [facility] 02/28/2015, 11:28 AM

## 2015-03-01 DIAGNOSIS — M1711 Unilateral primary osteoarthritis, right knee: Secondary | ICD-10-CM | POA: Diagnosis not present

## 2015-03-01 LAB — LIPID PANEL
Chol/HDL Ratio: 3.1 ratio units (ref 0.0–5.0)
Cholesterol, Total: 166 mg/dL (ref 100–199)
HDL: 53 mg/dL (ref >39)
LDL Calculated: 104 mg/dL — ABNORMAL HIGH (ref 0–99)
Triglycerides: 44 mg/dL (ref 0–149)
VLDL CHOLESTEROL CAL: 9 mg/dL (ref 5–40)

## 2015-03-01 LAB — CMP14+EGFR
A/G RATIO: 1.5 (ref 1.1–2.5)
ALBUMIN: 3.9 g/dL (ref 3.5–4.8)
ALT: 23 IU/L (ref 0–44)
AST: 23 IU/L (ref 0–40)
Alkaline Phosphatase: 88 IU/L (ref 39–117)
BILIRUBIN TOTAL: 0.4 mg/dL (ref 0.0–1.2)
BUN / CREAT RATIO: 20 (ref 10–22)
BUN: 20 mg/dL (ref 8–27)
CHLORIDE: 104 mmol/L (ref 96–106)
CO2: 25 mmol/L (ref 18–29)
Calcium: 9.5 mg/dL (ref 8.6–10.2)
Creatinine, Ser: 1.01 mg/dL (ref 0.76–1.27)
GFR calc non Af Amer: 73 mL/min/{1.73_m2} (ref >59)
GFR, EST AFRICAN AMERICAN: 85 mL/min/{1.73_m2} (ref >59)
GLOBULIN, TOTAL: 2.6 g/dL (ref 1.5–4.5)
Glucose: 123 mg/dL — ABNORMAL HIGH (ref 65–99)
POTASSIUM: 5.2 mmol/L (ref 3.5–5.2)
SODIUM: 144 mmol/L (ref 134–144)
TOTAL PROTEIN: 6.5 g/dL (ref 6.0–8.5)

## 2015-03-01 LAB — PSA, TOTAL AND FREE
PROSTATE SPECIFIC AG, SERUM: 0.9 ng/mL (ref 0.0–4.0)
PSA FREE: 0.39 ng/mL
PSA, Free Pct: 43.3 %

## 2015-05-11 DIAGNOSIS — D0439 Carcinoma in situ of skin of other parts of face: Secondary | ICD-10-CM | POA: Diagnosis not present

## 2015-05-11 DIAGNOSIS — L570 Actinic keratosis: Secondary | ICD-10-CM | POA: Diagnosis not present

## 2015-05-11 DIAGNOSIS — D485 Neoplasm of uncertain behavior of skin: Secondary | ICD-10-CM | POA: Diagnosis not present

## 2015-05-11 DIAGNOSIS — Z85828 Personal history of other malignant neoplasm of skin: Secondary | ICD-10-CM | POA: Diagnosis not present

## 2015-05-12 DIAGNOSIS — M1711 Unilateral primary osteoarthritis, right knee: Secondary | ICD-10-CM | POA: Diagnosis not present

## 2015-05-27 ENCOUNTER — Encounter: Payer: Self-pay | Admitting: *Deleted

## 2015-06-01 DIAGNOSIS — M1711 Unilateral primary osteoarthritis, right knee: Secondary | ICD-10-CM | POA: Diagnosis not present

## 2015-06-02 DIAGNOSIS — C44329 Squamous cell carcinoma of skin of other parts of face: Secondary | ICD-10-CM | POA: Diagnosis not present

## 2015-06-20 ENCOUNTER — Encounter: Payer: Self-pay | Admitting: Family Medicine

## 2015-06-20 ENCOUNTER — Ambulatory Visit (INDEPENDENT_AMBULATORY_CARE_PROVIDER_SITE_OTHER): Payer: Medicare Other | Admitting: Family Medicine

## 2015-06-20 VITALS — BP 130/74 | HR 63 | Temp 97.4°F | Ht 68.0 in | Wt 192.0 lb

## 2015-06-20 DIAGNOSIS — E785 Hyperlipidemia, unspecified: Secondary | ICD-10-CM | POA: Diagnosis not present

## 2015-06-20 DIAGNOSIS — Z01818 Encounter for other preprocedural examination: Secondary | ICD-10-CM | POA: Diagnosis not present

## 2015-06-20 DIAGNOSIS — M1711 Unilateral primary osteoarthritis, right knee: Secondary | ICD-10-CM | POA: Diagnosis not present

## 2015-06-20 NOTE — Progress Notes (Signed)
BP 130/74 mmHg  Pulse 63  Temp(Src) 97.4 F (36.3 C) (Oral)  Ht '5\' 8"'  (1.727 m)  Wt 192 lb (87.091 kg)  BMI 29.20 kg/m2   Subjective:    Patient ID: [name], male    DOB: 01/02/1941, 75 y.o.   MRN: [id number]  HPI: DWAINE PRINGLE is a 75 y.o. male presenting on 06/20/2015 for Surgical clearance   HPI Presurgical clearance Patient is coming in today for presurgical clearance. He is going to have a knee replacement. His right knee is going to be replaced next month. Patient denies headaches, blurred vision, chest pains, shortness of breath, or weakness. Denies any side effects from medication and is content with current medication.   Relevant past medical, surgical, family and social history reviewed and updated as indicated. Interim medical history since our last visit reviewed. Allergies and medications reviewed and updated.  Review of Systems  Constitutional: Negative for fever and chills.  HENT: Negative for ear discharge and ear pain.   Eyes: Negative for discharge and visual disturbance.  Respiratory: Negative for shortness of breath and wheezing.   Cardiovascular: Negative for chest pain, palpitations and leg swelling.  Gastrointestinal: Negative for abdominal pain, diarrhea and constipation.  Genitourinary: Negative for difficulty urinating.  Musculoskeletal: Positive for joint swelling and arthralgias. Negative for back pain and gait problem.  Skin: Negative for color change and rash.  Neurological: Negative for dizziness, syncope, light-headedness and headaches.  All other systems reviewed and are negative.   Per HPI unless specifically indicated above     Medication List       This list is accurate as of: 06/20/15  2:48 PM.  Always use your most recent med list.               amLODipine 5 MG tablet  Commonly known as:  NORVASC  Take 1 tablet (5 mg total) by mouth daily.     aspirin 81 MG tablet  Take 81 mg by mouth daily.     atorvastatin 20 MG  tablet  Commonly known as:  LIPITOR  Take 1 tablet (20 mg total) by mouth daily.     Calcium-Vitamin D 600-200 MG-UNIT Caps  Take 2 tablets by mouth daily.     cholecalciferol 1000 units tablet  Commonly known as:  VITAMIN D  Take 1,000 Units by mouth daily.     [employer] SPECTRAVITE ADULT 50+ PO  Take 1 tablet by mouth daily.     fish oil-omega-3 fatty acids 1000 MG capsule  Take 2 g by mouth daily.     glucosamine-chondroitin 500-400 MG tablet  Take 2 tablets by mouth daily.     meloxicam 15 MG tablet  Commonly known as:  MOBIC  Take 1 tablet (15 mg total) by mouth daily.     nitroGLYCERIN 0.4 MG SL tablet  Commonly known as:  NITROSTAT  Place 1 tablet (0.4 mg total) under the tongue every 5 (five) minutes as needed.     omeprazole 40 MG capsule  Commonly known as:  PRILOSEC  Take 1 capsule (40 mg total) by mouth daily.     vitamin E 400 UNIT capsule  Take 400 Units by mouth daily.           Objective:    BP 130/74 mmHg  Pulse 63  Temp(Src) 97.4 F (36.3 C) (Oral)  Ht '5\' 8"'  (1.727 m)  Wt 192 lb (87.091 kg)  BMI 29.20 kg/m2  Wt Readings from Last 3  Encounters:  06/20/15 192 lb (87.091 kg)  02/28/15 197 lb 3.2 oz (89.449 kg)  08/23/14 192 lb (87.091 kg)    Physical Exam  Constitutional: He is oriented to person, place, and time. He appears well-developed and well-nourished. No distress.  HENT:  Right Ear: External ear normal.  Left Ear: External ear normal.  Nose: Nose normal.  Mouth/Throat: Oropharynx is clear and moist.  Eyes: Conjunctivae and EOM are normal. Pupils are equal, round, and reactive to light. Right eye exhibits no discharge. No scleral icterus.  Neck: Neck supple. No thyromegaly present.  Cardiovascular: Normal rate, regular rhythm, normal heart sounds and intact distal pulses.   No murmur heard. Pulmonary/Chest: Effort normal and breath sounds normal. No respiratory distress. He has no wheezes.  Musculoskeletal: He exhibits tenderness. He  exhibits no edema.       Right knee: He exhibits decreased range of motion (Small amount of decreased flexion) and swelling. He exhibits no erythema, normal alignment, no LCL laxity, normal meniscus and no MCL laxity. Tenderness (Diffuse) found.  Lymphadenopathy:    He has no cervical adenopathy.  Neurological: He is alert and oriented to person, place, and time. No cranial nerve deficit. Coordination normal.  Skin: Skin is warm and dry. No rash noted. He is not diaphoretic.  Psychiatric: He has a normal mood and affect. His behavior is normal.  Vitals reviewed.     Assessment & Plan:       Problem List Items Addressed This Visit    None    Visit Diagnoses    Encounter for preoperative examination for general surgical procedure    -  Primary    Going for right knee replacement, await cardiology visit.    Relevant Orders    CBC with Differential/Platelet    Lipid panel    CMP14+EGFR       Has cardiology visit on 06/24/2015  Follow up plan: Return in about 6 months (around 12/20/2015), or if symptoms worsen or fail to improve, for Cholesterol recheck.  Counseling provided for all of the vaccine components Orders Placed This Encounter  Procedures  . CBC with Differential/Platelet  . Lipid panel  . Virginia Shakora Nordquist, MD [facility] 06/20/2015, 2:48 PM

## 2015-06-21 LAB — CMP14+EGFR
ALBUMIN: 4.1 g/dL (ref 3.5–4.8)
ALK PHOS: 96 IU/L (ref 39–117)
ALT: 15 IU/L (ref 0–44)
AST: 19 IU/L (ref 0–40)
Albumin/Globulin Ratio: 2.1 (ref 1.2–2.2)
BILIRUBIN TOTAL: 0.3 mg/dL (ref 0.0–1.2)
BUN / CREAT RATIO: 21 (ref 10–24)
BUN: 23 mg/dL (ref 8–27)
CHLORIDE: 103 mmol/L (ref 96–106)
CO2: 27 mmol/L (ref 18–29)
CREATININE: 1.07 mg/dL (ref 0.76–1.27)
Calcium: 8.8 mg/dL (ref 8.6–10.2)
GFR calc Af Amer: 79 mL/min/{1.73_m2} (ref >59)
GFR calc non Af Amer: 68 mL/min/{1.73_m2} (ref >59)
GLUCOSE: 147 mg/dL — AB (ref 65–99)
Globulin, Total: 2 g/dL (ref 1.5–4.5)
Potassium: 4.1 mmol/L (ref 3.5–5.2)
Sodium: 143 mmol/L (ref 134–144)
Total Protein: 6.1 g/dL (ref 6.0–8.5)

## 2015-06-21 LAB — LIPID PANEL
CHOL/HDL RATIO: 4 ratio (ref 0.0–5.0)
Cholesterol, Total: 170 mg/dL (ref 100–199)
HDL: 42 mg/dL (ref >39)
LDL CALC: 106 mg/dL — AB (ref 0–99)
Triglycerides: 109 mg/dL (ref 0–149)
VLDL CHOLESTEROL CAL: 22 mg/dL (ref 5–40)

## 2015-06-21 LAB — CBC WITH DIFFERENTIAL/PLATELET
BASOS: 0 %
Basophils Absolute: 0 10*3/uL (ref 0.0–0.2)
EOS (ABSOLUTE): 0.1 10*3/uL (ref 0.0–0.4)
EOS: 1 %
HEMATOCRIT: 36.1 % — AB (ref 37.5–51.0)
HEMOGLOBIN: 11.8 g/dL — AB (ref 12.6–17.7)
IMMATURE GRANS (ABS): 0 10*3/uL (ref 0.0–0.1)
IMMATURE GRANULOCYTES: 0 %
LYMPHS: 18 %
Lymphocytes Absolute: 1 10*3/uL (ref 0.7–3.1)
MCH: 29.6 pg (ref 26.6–33.0)
MCHC: 32.7 g/dL (ref 31.5–35.7)
MCV: 91 fL (ref 79–97)
Monocytes Absolute: 0.4 10*3/uL (ref 0.1–0.9)
Monocytes: 7 %
NEUTROS ABS: 4.2 10*3/uL (ref 1.4–7.0)
NEUTROS PCT: 74 %
Platelets: 235 10*3/uL (ref 150–379)
RBC: 3.98 x10E6/uL — ABNORMAL LOW (ref 4.14–5.80)
RDW: 14.3 % (ref 12.3–15.4)
WBC: 5.7 10*3/uL (ref 3.4–10.8)

## 2015-06-24 ENCOUNTER — Encounter: Payer: Self-pay | Admitting: Cardiovascular Disease

## 2015-06-24 ENCOUNTER — Ambulatory Visit (INDEPENDENT_AMBULATORY_CARE_PROVIDER_SITE_OTHER): Payer: Medicare Other | Admitting: Cardiovascular Disease

## 2015-06-24 VITALS — BP 134/62 | HR 61 | Ht 67.0 in | Wt 195.0 lb

## 2015-06-24 DIAGNOSIS — I452 Bifascicular block: Secondary | ICD-10-CM | POA: Diagnosis not present

## 2015-06-24 DIAGNOSIS — R079 Chest pain, unspecified: Secondary | ICD-10-CM | POA: Diagnosis not present

## 2015-06-24 DIAGNOSIS — Z01818 Encounter for other preprocedural examination: Secondary | ICD-10-CM | POA: Diagnosis not present

## 2015-06-24 DIAGNOSIS — I10 Essential (primary) hypertension: Secondary | ICD-10-CM | POA: Diagnosis not present

## 2015-06-24 NOTE — Patient Instructions (Signed)
Your physician recommends that you schedule a follow-up appointment AS NEEDED WITH DR. KONESWARAN  Your physician recommends that you continue on your current medications as directed. Please refer to the Current Medication list given to you today.  Thank you for choosing [facility]!!   

## 2015-06-24 NOTE — Progress Notes (Signed)
Patient ID: Shawn Lambert, male   DOB: [birth year], 75 y.o.   MRN: [id number]      SUBJECTIVE: Mr. Krenek returns for routine follow-up. I evaluated him in April 2015 for chest pain. He underwent a low risk nuclear stress test. He has a history of hypertension, hyperlipidemia, and GERD. He has a baseline right bundle branch block and left anterior fascicular block.  The patient denies any symptoms of chest pain, palpitations, shortness of breath, lightheadedness, dizziness, leg swelling, orthopnea, PND, and syncope.  Plans to undergo right knee replacement surgery and requires preoperative clearance.   Review of Systems: As per "subjective", otherwise negative.  Allergies  Allergen Reactions  . Penicillins   . Sulfonamide Derivatives     REACTION: Reaction not known    Current Outpatient Prescriptions  Medication Sig Dispense Refill  . amLODipine (NORVASC) 5 MG tablet Take 1 tablet (5 mg total) by mouth daily. 90 tablet 1  . aspirin 81 MG tablet Take 81 mg by mouth daily.    Marland Kitchen atorvastatin (LIPITOR) 20 MG tablet Take 1 tablet (20 mg total) by mouth daily. 90 tablet 1  . Calcium Carbonate-Vitamin D (CALCIUM-VITAMIN D) 600-200 MG-UNIT CAPS Take 2 tablets by mouth daily.    . cholecalciferol (VITAMIN D) 1000 UNITS tablet Take 1,000 Units by mouth daily.    . fish oil-omega-3 fatty acids 1000 MG capsule Take 2 g by mouth daily.    Marland Kitchen glucosamine-chondroitin 500-400 MG tablet Take 2 tablets by mouth daily.    . meloxicam (MOBIC) 15 MG tablet Take 1 tablet (15 mg total) by mouth daily. 90 tablet 1  . Multiple Vitamins-Minerals (CVS SPECTRAVITE ADULT 50+ PO) Take 1 tablet by mouth daily.    . nitroGLYCERIN (NITROSTAT) 0.4 MG SL tablet Place 1 tablet (0.4 mg total) under the tongue every 5 (five) minutes as needed. 25 tablet 3  . omeprazole (PRILOSEC) 40 MG capsule Take 1 capsule (40 mg total) by mouth daily. 90 capsule 3  . vitamin E 400 UNIT capsule Take 400 Units by mouth daily.     No  current facility-administered medications for this visit.    Past Medical History  Diagnosis Date  . Kidney stones   . Hypertension   . Hyperlipidemia   . GERD (gastroesophageal reflux disease)     Past Surgical History  Procedure Laterality Date  . Total knee arthroplasty    . Eye surgery Right 1965    Prosthetic Eye    Social History   Social History  . Marital Status: Married    Spouse Name: N/A  . Number of Children: N/A  . Years of Education: N/A   Occupational History  . Not on file.   Social History Main Topics  . Smoking status: Never Smoker   . Smokeless tobacco: Never Used  . Alcohol Use: No  . Drug Use: No  . Sexual Activity: Not on file   Other Topics Concern  . Not on file   Social History Narrative     Filed Vitals:   06/24/15 0825  BP: 134/62  Pulse: 61  Height: 5\' 7"  (1.702 m)  Weight: 195 lb (88.451 kg)  SpO2: 97%    PHYSICAL EXAM General: NAD HEENT: Normal. Neck: No JVD, no thyromegaly. Lungs: Clear to auscultation bilaterally with normal respiratory effort. CV: Nondisplaced PMI. Regular rate and rhythm, normal S1/S2, no [id number], soft 1/6 systolic murmur along left sternal border. No carotid bruits. No pedal edema. Abdomen: Soft, nontender, no distention.  Neurologic:  Alert and oriented x 3.  Psych: Normal affect.  ECG: Most recent ECG reviewed.    ASSESSMENT AND PLAN: 1. Chest pain: No recurrences.  2. Essential HTN: Well controlled. No changes.  3. RBBB and LAFB: Symptomatically stable.  4. Preoperative clearance: He can safely proceed with right knee replacement surgery. No cardiac testing is required.  Dispo: fu prn.  Kate Sable, M.D., F.A.C.C.

## 2015-07-07 NOTE — H&P (Signed)
TOTAL KNEE ADMISSION H&P  Patient is being admitted for right total knee arthroplasty.  Subjective:  Chief Complaint:    Right knee primary OA / pain  HPI: [name], 75 y.o. male, has a history of pain and functional disability in the right knee due to arthritis and has failed non-surgical conservative treatments for greater than 12 weeks to include NSAID's and/or analgesics, corticosteriod injections, viscosupplementation injections and activity modification.  Onset of symptoms was gradual, starting 9+ years ago with gradually worsening course since that time. The patient noted prior procedures on the knee to include  arthroplasty on the left knee per Dr. Veverly Fells in 2008.  Patient currently rates pain in the right knee(s) at 10 out of 10 with activity. Patient has worsening of pain with activity and weight bearing, pain that interferes with activities of daily living, pain with passive range of motion, crepitus and joint swelling.  Patient has evidence of periarticular osteophytes and joint space narrowing by imaging studies.  There is no active infection.   Risks, benefits and expectations were discussed with the patient.  Risks including but not limited to the risk of anesthesia, blood clots, nerve damage, blood vessel damage, failure of the prosthesis, infection and up to and including death.  Patient understand the risks, benefits and expectations and wishes to proceed with surgery.   PCP: Redge Gainer, MD  D/C Plans:      Home with HHPT  Post-op Meds:       No Rx given  Tranexamic Acid:      To be given - IV   Decadron:      Is to be given  FYI:     ASA  Norco  OPPT  RW   Patient Active Problem List   Diagnosis Date Noted  . HYPERTENSION, HEART CONTROLLED W/O ASSOC CHF 07/12/2009  . Hyperlipidemia LDL goal <130 06/15/2009  . GERD 06/15/2009  . Osteoarthritis of both knees 06/15/2009  . ABNORMAL STRESS ELECTROCARDIOGRAM 06/15/2009   Past Medical History  Diagnosis Date   . Kidney stones   . Hypertension   . Hyperlipidemia   . GERD (gastroesophageal reflux disease)     Past Surgical History  Procedure Laterality Date  . Total knee arthroplasty    . Eye surgery Right 1965    Prosthetic Eye    No prescriptions prior to admission   Allergies  Allergen Reactions  . Penicillins   . Sulfonamide Derivatives     REACTION: Reaction not known    Social History  Substance Use Topics  . Smoking status: Never Smoker   . Smokeless tobacco: Never Used  . Alcohol Use: No    Family History  Problem Relation Age of Onset  . Congestive Heart Failure Mother   . AAA (abdominal aortic aneurysm) Mother   . Congestive Heart Failure Father   . Heart attack Brother 50     Review of Systems  Constitutional: Negative.   HENT: Positive for hearing loss and tinnitus.   Eyes: Negative.   Respiratory: Negative.   Cardiovascular: Negative.   Gastrointestinal: Positive for heartburn.  Genitourinary: Positive for frequency.  Musculoskeletal: Positive for joint pain.  Skin: Negative.   Neurological: Negative.   Endo/Heme/Allergies: Negative.   Psychiatric/Behavioral: Negative.     Objective:  Physical Exam  Constitutional: He is oriented to person, place, and time. He appears well-developed.  HENT:  Head: Normocephalic.  Eyes: Pupils are equal, round, and reactive to light.  Neck: Neck supple. No JVD present.  No tracheal deviation present. No thyromegaly present.  Cardiovascular: Normal rate, regular rhythm, normal heart sounds and intact distal pulses.   Respiratory: Effort normal and breath sounds normal. No stridor. No respiratory distress. He has no wheezes.  GI: Soft. There is no tenderness. There is no guarding.  Musculoskeletal:       Right knee: He exhibits decreased range of motion, swelling and bony tenderness. He exhibits no ecchymosis, no deformity, no laceration and no erythema. Tenderness found.  Lymphadenopathy:    He has no cervical  adenopathy.  Neurological: He is alert and oriented to person, place, and time.  Skin: Skin is warm and dry.  Psychiatric: He has a normal mood and affect.       Labs:  Estimated body mass index is 29.99 kg/(m^2) as calculated from the following:   Height as of 02/28/15: 5\' 8"  (1.727 m).   Weight as of 02/28/15: 89.449 kg (197 lb 3.2 oz).   Imaging Review Plain radiographs demonstrate severe degenerative joint disease of the right knee(s).  The bone quality appears to be good for age and reported activity level.  Assessment/Plan:  End stage arthritis, right knee   The patient history, physical examination, clinical judgment of the provider and imaging studies are consistent with end stage degenerative joint disease of the right knee(s) and total knee arthroplasty is deemed medically necessary. The treatment options including medical management, injection therapy arthroscopy and arthroplasty were discussed at length. The risks and benefits of total knee arthroplasty were presented and reviewed. The risks due to aseptic loosening, infection, stiffness, patella tracking problems, thromboembolic complications and other imponderables were discussed. The patient acknowledged the explanation, agreed to proceed with the plan and consent was signed. Patient is being admitted for inpatient treatment for surgery, pain control, PT, OT, prophylactic antibiotics, VTE prophylaxis, progressive ambulation and ADL's and discharge planning. The patient is planning to be discharged home with home health services.     West Pugh Esias Mory   PA-C  07/07/2015, 9:43 PM

## 2015-07-14 ENCOUNTER — Other Ambulatory Visit (HOSPITAL_COMMUNITY): Payer: Self-pay | Admitting: *Deleted

## 2015-07-14 NOTE — Patient Instructions (Addendum)
Shawn Lambert  07/14/2015   Your procedure is scheduled on: 07-26-15  Report to [facility]  Entrance take [facility]  elevators to 3rd floor to  [place] at 1000 AM.  Call this number if you have problems the morning of surgery [phone]   Remember: ONLY 1 PERSON MAY GO WITH YOU TO SHORT STAY TO GET  READY MORNING OF [name].  Do not eat food or drink liquids :After Midnight.     Take these medicines the morning of surgery with A SIP OF WATER: AMLODIPINE (NORVASC), ATORVASTATIN (LIPITOR), OMEPRAZOLE (PRILOSEC)              You may not have any metal on your body including hair pins and              piercings  Do not wear jewelry, , lotions, powders or perfumes, deodorant                      Men may shave face and neck.   Do not bring valuables to the hospital. [name].  Contacts, dentures or bridgework may not be worn into surgery.  Leave suitcase in the car. After surgery it may be brought to your room.              Special instructions:  Coughing and deep breathing exercises, leg exercises.                Please read over the following fact sheets you were given: _____________________________________________________________________             [name] - Preparing for Surgery Before surgery, you can play an important role.  Because skin is not sterile, your skin needs to be as free of germs as possible.  You can reduce the number of germs on your skin by washing with CHG (chlorahexidine gluconate) soap before surgery.  CHG is an antiseptic cleaner which kills germs and bonds with the skin to continue killing germs even after washing. Please DO NOT use if you have an allergy to CHG or antibacterial soaps.  If your skin becomes reddened/irritated stop using the CHG and inform your nurse when you arrive at Short Stay. Do not shave (including legs and underarms) for at least 48 hours prior  to the first CHG shower.  You may shave your face/neck. Please follow these instructions carefully:  1.  Shower with CHG Soap the night before surgery and the  morning of Surgery.  2.  If you choose to wash your hair, wash your hair first as usual with your  normal  shampoo.  3.  After you shampoo, rinse your hair and body thoroughly to remove the  shampoo.                           4.  Use CHG as you would any other liquid soap.  You can apply chg directly  to the skin and wash                       Gently with a scrungie or clean washcloth.  5.  Apply the CHG Soap to your body ONLY FROM THE NECK DOWN.   Do  not use on face/ open                           Wound or open sores. Avoid contact with eyes, ears mouth and genitals (private parts).                       Wash face,  Genitals (private parts) with your normal soap.             6.  Wash thoroughly, paying special attention to the area where your surgery  will be performed.  7.  Thoroughly rinse your body with warm water from the neck down.  8.  DO NOT shower/wash with your normal soap after using and rinsing off  the CHG Soap.                9.  Pat yourself dry with a clean towel.            10.  Wear clean pajamas.            11.  Place clean sheets on your bed the night of your first shower and do not  sleep with pets. Day of Surgery : Do not apply any lotions/deodorants the morning of surgery.  Please wear clean clothes to the hospital/surgery center.  FAILURE TO FOLLOW THESE INSTRUCTIONS MAY RESULT IN THE CANCELLATION OF YOUR SURGERY PATIENT SIGNATURE_________________________________  NURSE SIGNATURE__________________________________  ________________________________________________________________________   Shawn Lambert  An incentive spirometer is a tool that can help keep your lungs clear and active. This tool measures how well you are filling your lungs with each breath. Taking long deep breaths may help reverse or  decrease the chance of developing breathing (pulmonary) problems (especially infection) following:  A long period of time when you are unable to move or be active. BEFORE THE PROCEDURE   If the spirometer includes an indicator to show your best effort, your nurse or respiratory therapist will set it to a desired goal.  If possible, sit up straight or lean slightly forward. Try not to slouch.  Hold the incentive spirometer in an upright position. INSTRUCTIONS FOR USE   Sit on the edge of your bed if possible, or sit up as far as you can in bed or on a chair.  Hold the incentive spirometer in an upright position.  Breathe out normally.  Place the mouthpiece in your mouth and seal your lips tightly around it.  Breathe in slowly and as deeply as possible, raising the piston or the ball toward the top of the column.  Hold your breath for 3-5 seconds or for as long as possible. Allow the piston or ball to fall to the bottom of the column.  Remove the mouthpiece from your mouth and breathe out normally.  Rest for a few seconds and repeat Steps 1 through 7 at least 10 times every 1-2 hours when you are awake. Take your time and take a few normal breaths between deep breaths.  The spirometer may include an indicator to show your best effort. Use the indicator as a goal to work toward during each repetition.  After each set of 10 deep breaths, practice coughing to be sure your lungs are clear. If you have an incision (the cut made at the time of surgery), support your incision when coughing by placing a pillow or rolled up towels firmly against it. Once you are able to get out of bed,  walk around indoors and cough well. You may stop using the incentive spirometer when instructed by your caregiver.  RISKS AND COMPLICATIONS  Take your time so you do not get dizzy or light-headed.  If you are in pain, you may need to take or ask for pain medication before doing incentive spirometry. It is  harder to take a deep breath if you are having pain. AFTER USE  Rest and breathe slowly and easily.  It can be helpful to keep track of a log of your progress. Your caregiver can provide you with a simple table to help with this. If you are using the spirometer at home, follow these instructions: [place] IF:   You are having difficultly using the spirometer.  You have trouble using the spirometer as often as instructed.  Your pain medication is not giving enough relief while using the spirometer.  You develop fever of 100.5 F (38.1 C) or higher. SEEK IMMEDIATE MEDICAL CARE IF:   You cough up bloody sputum that had not been present before.  You develop fever of 102 F (38.9 C) or greater.  You develop worsening pain at or near the incision site. MAKE SURE YOU:   Understand these instructions.  Will watch your condition.  Will get help right away if you are not doing well or get worse. Document Released: 07/16/2006 Document Revised: 05/28/2011 Document Reviewed: 09/16/2006 ExitCare Patient Information 2014 Exit[employer], [place].   ________________________________________________________________________  WHAT IS A BLOOD TRANSFUSION? Blood Transfusion Information  A transfusion is the replacement of blood or some of its parts. Blood is made up of multiple cells which provide different functions.  Red blood cells carry oxygen and are used for blood loss replacement.  White blood cells fight against infection.  Platelets control bleeding.  Plasma helps clot blood.  Other blood products are available for specialized needs, such as hemophilia or other clotting disorders. BEFORE THE TRANSFUSION  Who gives blood for transfusions?   Healthy volunteers who are fully evaluated to make sure their blood is safe. This is blood bank blood. Transfusion therapy is the safest it has ever been in the practice of medicine. Before blood is taken from a donor, a complete history  is taken to make sure that person has no history of diseases nor engages in risky social behavior (examples are intravenous drug use or sexual activity with multiple partners). The donor's travel history is screened to minimize risk of transmitting infections, such as malaria. The donated blood is tested for signs of infectious diseases, such as HIV and hepatitis. The blood is then tested to be sure it is compatible with you in order to minimize the chance of a transfusion reaction. If you or a relative donates blood, this is often done in anticipation of surgery and is not appropriate for emergency situations. It takes many days to process the donated blood. RISKS AND COMPLICATIONS Although transfusion therapy is very safe and saves many lives, the main dangers of transfusion include:   Getting an infectious disease.  Developing a transfusion reaction. This is an allergic reaction to something in the blood you were given. Every precaution is taken to prevent this. The decision to have a blood transfusion has been considered carefully by your caregiver before blood is given. Blood is not given unless the benefits outweigh the risks. AFTER THE TRANSFUSION  Right after receiving a blood transfusion, you will usually feel much better and more energetic. This is especially true if your red blood cells  have gotten low (anemic). The transfusion raises the level of the red blood cells which carry oxygen, and this usually causes an energy increase.  The nurse administering the transfusion will monitor you carefully for complications. HOME CARE INSTRUCTIONS  No special instructions are needed after a transfusion. You may find your energy is better. Speak with your caregiver about any limitations on activity for underlying diseases you may have. SEEK MEDICAL CARE IF:   Your condition is not improving after your transfusion.  You develop redness or irritation at the intravenous (IV) site. SEEK IMMEDIATE  MEDICAL CARE IF:  Any of the following symptoms occur over the next 12 hours:  Shaking chills.  You have a temperature by mouth above 102 F (38.9 C), not controlled by medicine.  Chest, back, or muscle pain.  People around you feel you are not acting correctly or are confused.  Shortness of breath or difficulty breathing.  Dizziness and fainting.  You get a rash or develop hives.  You have a decrease in urine output.  Your urine turns a dark color or changes to pink, red, or brown. Any of the following symptoms occur over the next 10 days:  You have a temperature by mouth above 102 F (38.9 C), not controlled by medicine.  Shortness of breath.  Weakness after normal activity.  The white part of the eye turns yellow (jaundice).  You have a decrease in the amount of urine or are urinating less often.  Your urine turns a dark color or changes to pink, red, or brown. Document Released: 03/02/2000 Document Revised: 05/28/2011 Document Reviewed: 10/20/2007 [facility] Patient Information 2014 [place], [place].  _______________________________________________________________________

## 2015-07-14 NOTE — Progress Notes (Signed)
CARDIAC CLEARANCE NOTE DR Bronson Ing 06-24-15 EPIC MEDICAL CLEARANCE NOTE DR DETTINGER 06-20-15 EPIC  EKG 08-23-14 EPIC

## 2015-07-15 ENCOUNTER — Encounter (HOSPITAL_COMMUNITY): Payer: Self-pay

## 2015-07-15 ENCOUNTER — Encounter (HOSPITAL_COMMUNITY)
Admission: RE | Admit: 2015-07-15 | Discharge: 2015-07-15 | Disposition: A | Discharge status: Home / Self Care | Payer: Medicare Other | Source: Ambulatory Visit | Attending: Orthopedic Surgery | Admitting: Orthopedic Surgery

## 2015-07-15 DIAGNOSIS — K219 Gastro-esophageal reflux disease without esophagitis: Secondary | ICD-10-CM | POA: Insufficient documentation

## 2015-07-15 DIAGNOSIS — Z01812 Encounter for preprocedural laboratory examination: Secondary | ICD-10-CM | POA: Diagnosis not present

## 2015-07-15 DIAGNOSIS — I10 Essential (primary) hypertension: Secondary | ICD-10-CM | POA: Insufficient documentation

## 2015-07-15 DIAGNOSIS — I499 Cardiac arrhythmia, unspecified: Secondary | ICD-10-CM | POA: Diagnosis not present

## 2015-07-15 DIAGNOSIS — E785 Hyperlipidemia, unspecified: Secondary | ICD-10-CM | POA: Insufficient documentation

## 2015-07-15 HISTORY — DX: Unspecified osteoarthritis, unspecified site: M19.90

## 2015-07-15 HISTORY — DX: Malignant (primary) neoplasm, unspecified: C80.1

## 2015-07-15 HISTORY — DX: Cardiac arrhythmia, unspecified: I49.9

## 2015-07-15 HISTORY — DX: Angina pectoris, unspecified: I20.9

## 2015-07-15 LAB — TYPE AND SCREEN
ABO/RH(D): A NEG
Antibody Screen: NEGATIVE

## 2015-07-15 LAB — BASIC METABOLIC PANEL
ANION GAP: 11 (ref 5–15)
BUN: 18 mg/dL (ref 6–20)
CALCIUM: 9.2 mg/dL (ref 8.9–10.3)
CO2: 25 mmol/L (ref 22–32)
CREATININE: 1.04 mg/dL (ref 0.61–1.24)
Chloride: 108 mmol/L (ref 101–111)
GFR calc Af Amer: >60 mL/min (ref >60)
GFR calc non Af Amer: >60 mL/min (ref >60)
GLUCOSE: 106 mg/dL — AB (ref 65–99)
Potassium: 4 mmol/L (ref 3.5–5.1)
Sodium: 144 mmol/L (ref 135–145)

## 2015-07-15 LAB — CBC
HEMATOCRIT: 36.1 % — AB (ref 39.0–52.0)
Hemoglobin: 12 g/dL — ABNORMAL LOW (ref 13.0–17.0)
MCH: 30.2 pg (ref 26.0–34.0)
MCHC: 33.2 g/dL (ref 30.0–36.0)
MCV: 90.7 fL (ref 78.0–100.0)
Platelets: 226 10*3/uL (ref 150–400)
RBC: 3.98 MIL/uL — ABNORMAL LOW (ref 4.22–5.81)
RDW: 13.2 % (ref 11.5–15.5)
WBC: 5.1 10*3/uL (ref 4.0–10.5)

## 2015-07-15 LAB — ABO/RH: ABO/RH(D): A NEG

## 2015-07-15 LAB — SURGICAL PCR SCREEN
MRSA, PCR: NEGATIVE
STAPHYLOCOCCUS AUREUS: NEGATIVE

## 2015-07-19 ENCOUNTER — Ambulatory Visit: Payer: Self-pay | Admitting: Cardiovascular Disease

## 2015-07-26 ENCOUNTER — Inpatient Hospital Stay (HOSPITAL_COMMUNITY)
Admission: RE | Admit: 2015-07-26 | Discharge: 2015-07-27 | DRG: 470 | Disposition: A | Discharge status: Home Health | Payer: Medicare Other | Source: Ambulatory Visit | Attending: Orthopedic Surgery | Admitting: Orthopedic Surgery

## 2015-07-26 ENCOUNTER — Encounter (HOSPITAL_COMMUNITY): Payer: Self-pay | Admitting: *Deleted

## 2015-07-26 ENCOUNTER — Inpatient Hospital Stay (HOSPITAL_COMMUNITY): Payer: Medicare Other | Admitting: Certified Registered"

## 2015-07-26 ENCOUNTER — Encounter (HOSPITAL_COMMUNITY)
Admission: RE | Disposition: A | Discharge status: Home Health | Payer: Self-pay | Source: Ambulatory Visit | Attending: Orthopedic Surgery

## 2015-07-26 DIAGNOSIS — Z6828 Body mass index (BMI) 28.0-28.9, adult: Secondary | ICD-10-CM | POA: Diagnosis not present

## 2015-07-26 DIAGNOSIS — Z96651 Presence of right artificial knee joint: Secondary | ICD-10-CM

## 2015-07-26 DIAGNOSIS — E785 Hyperlipidemia, unspecified: Secondary | ICD-10-CM | POA: Diagnosis present

## 2015-07-26 DIAGNOSIS — M659 Synovitis and tenosynovitis, unspecified: Secondary | ICD-10-CM | POA: Diagnosis present

## 2015-07-26 DIAGNOSIS — K219 Gastro-esophageal reflux disease without esophagitis: Secondary | ICD-10-CM | POA: Diagnosis present

## 2015-07-26 DIAGNOSIS — Z87442 Personal history of urinary calculi: Secondary | ICD-10-CM | POA: Diagnosis not present

## 2015-07-26 DIAGNOSIS — I10 Essential (primary) hypertension: Secondary | ICD-10-CM | POA: Diagnosis present

## 2015-07-26 DIAGNOSIS — M25561 Pain in right knee: Secondary | ICD-10-CM | POA: Diagnosis not present

## 2015-07-26 DIAGNOSIS — I209 Angina pectoris, unspecified: Secondary | ICD-10-CM | POA: Diagnosis not present

## 2015-07-26 DIAGNOSIS — Z970 Presence of artificial eye: Secondary | ICD-10-CM

## 2015-07-26 DIAGNOSIS — Z96659 Presence of unspecified artificial knee joint: Secondary | ICD-10-CM

## 2015-07-26 DIAGNOSIS — M1711 Unilateral primary osteoarthritis, right knee: Principal | ICD-10-CM | POA: Diagnosis present

## 2015-07-26 DIAGNOSIS — E663 Overweight: Secondary | ICD-10-CM | POA: Diagnosis present

## 2015-07-26 DIAGNOSIS — I2510 Atherosclerotic heart disease of native coronary artery without angina pectoris: Secondary | ICD-10-CM | POA: Diagnosis not present

## 2015-07-26 DIAGNOSIS — Z8249 Family history of ischemic heart disease and other diseases of the circulatory system: Secondary | ICD-10-CM

## 2015-07-26 DIAGNOSIS — M179 Osteoarthritis of knee, unspecified: Secondary | ICD-10-CM | POA: Diagnosis not present

## 2015-07-26 HISTORY — PX: TOTAL KNEE ARTHROPLASTY: SHX125

## 2015-07-26 SURGERY — ARTHROPLASTY, KNEE, TOTAL
Anesthesia: General | Site: Knee | Laterality: Right

## 2015-07-26 MED ORDER — DOCUSATE SODIUM 100 MG PO CAPS
100.0000 mg | ORAL_CAPSULE | Freq: Two times a day (BID) | ORAL | Status: DC
  Administered 2015-07-26 – 2015-07-27 (×2): 100 mg via ORAL
  Filled 2015-07-26 (×2): qty 1

## 2015-07-26 MED ORDER — PHENOL 1.4 % MT LIQD
1.0000 | OROMUCOSAL | Status: DC | PRN
Start: 2015-07-26 — End: 2015-07-27

## 2015-07-26 MED ORDER — DIPHENHYDRAMINE HCL 25 MG PO CAPS: 25.0000 mg | ORAL_CAPSULE | Freq: Four times a day (QID) | ORAL | Status: DC | PRN

## 2015-07-26 MED ORDER — TRANEXAMIC ACID 1000 MG/10ML IV SOLN
1000.0000 mg | Freq: Once | INTRAVENOUS | Status: AC
  Administered 2015-07-26: 1000 mg via INTRAVENOUS
  Filled 2015-07-26: qty 10

## 2015-07-26 MED ORDER — ROCURONIUM BROMIDE 100 MG/10ML IV SOLN
INTRAVENOUS | Status: DC | PRN
  Administered 2015-07-26: 50 mg via INTRAVENOUS

## 2015-07-26 MED ORDER — SODIUM CHLORIDE 0.9 % IR SOLN
Status: DC | PRN
  Administered 2015-07-26: 1000 mL

## 2015-07-26 MED ORDER — METOCLOPRAMIDE HCL 5 MG PO TABS: 5.0000 mg | ORAL_TABLET | Freq: Three times a day (TID) | ORAL | Status: DC | PRN

## 2015-07-26 MED ORDER — NITROGLYCERIN 0.4 MG SL SUBL: 0.4000 mg | SUBLINGUAL_TABLET | SUBLINGUAL | Status: DC | PRN

## 2015-07-26 MED ORDER — ROCURONIUM BROMIDE 50 MG/5ML IV SOLN
INTRAVENOUS | Status: AC
  Filled 2015-07-26: qty 1

## 2015-07-26 MED ORDER — SODIUM CHLORIDE 0.9 % IV SOLN
INTRAVENOUS | Status: DC
  Administered 2015-07-26 – 2015-07-27 (×2): via INTRAVENOUS
  Filled 2015-07-26 (×4): qty 1000

## 2015-07-26 MED ORDER — DEXAMETHASONE SODIUM PHOSPHATE 10 MG/ML IJ SOLN
10.0000 mg | Freq: Once | INTRAMUSCULAR | Status: AC
  Administered 2015-07-26: 10 mg via INTRAVENOUS

## 2015-07-26 MED ORDER — LIDOCAINE HCL (CARDIAC) 20 MG/ML IV SOLN
INTRAVENOUS | Status: AC
  Filled 2015-07-26: qty 5

## 2015-07-26 MED ORDER — HYDROMORPHONE HCL 1 MG/ML IJ SOLN
0.5000 mg | INTRAMUSCULAR | Status: AC | PRN
  Administered 2015-07-26 (×4): 0.5 mg via INTRAVENOUS

## 2015-07-26 MED ORDER — DEXAMETHASONE SODIUM PHOSPHATE 10 MG/ML IJ SOLN: 10.0000 mg | Freq: Once | INTRAMUSCULAR | Status: DC

## 2015-07-26 MED ORDER — ALUM & MAG HYDROXIDE-SIMETH 200-200-20 MG/5ML PO SUSP
30.0000 mL | ORAL | Status: DC | PRN
Start: 2015-07-26 — End: 2015-07-27

## 2015-07-26 MED ORDER — SODIUM CHLORIDE 0.9 % IJ SOLN
INTRAMUSCULAR | Status: AC
  Filled 2015-07-26: qty 50

## 2015-07-26 MED ORDER — LACTATED RINGERS IV SOLN
INTRAVENOUS | Status: DC
  Administered 2015-07-26 (×3): via INTRAVENOUS

## 2015-07-26 MED ORDER — DEXAMETHASONE SODIUM PHOSPHATE 10 MG/ML IJ SOLN
INTRAMUSCULAR | Status: AC
  Filled 2015-07-26: qty 1

## 2015-07-26 MED ORDER — ONDANSETRON HCL 4 MG PO TABS: 4.0000 mg | ORAL_TABLET | Freq: Four times a day (QID) | ORAL | Status: DC | PRN

## 2015-07-26 MED ORDER — AMLODIPINE BESYLATE 5 MG PO TABS
5.0000 mg | ORAL_TABLET | Freq: Every day | ORAL | Status: DC
  Administered 2015-07-27: 5 mg via ORAL
  Filled 2015-07-26: qty 1

## 2015-07-26 MED ORDER — METHOCARBAMOL 500 MG PO TABS
500.0000 mg | ORAL_TABLET | Freq: Four times a day (QID) | ORAL | Status: DC | PRN
  Administered 2015-07-27: 500 mg via ORAL
  Filled 2015-07-26 (×2): qty 1

## 2015-07-26 MED ORDER — ONDANSETRON HCL 4 MG/2ML IJ SOLN: 4.0000 mg | Freq: Once | INTRAMUSCULAR | Status: DC | PRN

## 2015-07-26 MED ORDER — FENTANYL CITRATE (PF) 100 MCG/2ML IJ SOLN
INTRAMUSCULAR | Status: AC
  Filled 2015-07-26: qty 2

## 2015-07-26 MED ORDER — ONDANSETRON HCL 4 MG/2ML IJ SOLN
INTRAMUSCULAR | Status: AC
  Filled 2015-07-26: qty 4

## 2015-07-26 MED ORDER — HYDROMORPHONE HCL 1 MG/ML IJ SOLN
INTRAMUSCULAR | Status: AC
  Administered 2015-07-26: 0.5 mg
  Filled 2015-07-26: qty 1

## 2015-07-26 MED ORDER — HYDROMORPHONE HCL 1 MG/ML IJ SOLN
INTRAMUSCULAR | Status: DC | PRN
  Administered 2015-07-26: .6 mg via INTRAVENOUS
  Administered 2015-07-26 (×2): .4 mg via INTRAVENOUS
  Administered 2015-07-26: .2 mg via INTRAVENOUS
  Administered 2015-07-26: .4 mg via INTRAVENOUS

## 2015-07-26 MED ORDER — SODIUM CHLORIDE 0.9 % IJ SOLN
INTRAMUSCULAR | Status: DC | PRN
Start: 2015-07-26 — End: 2015-07-26
  Administered 2015-07-26: 30 mL

## 2015-07-26 MED ORDER — METOCLOPRAMIDE HCL 5 MG/ML IJ SOLN: 5.0000 mg | Freq: Three times a day (TID) | INTRAMUSCULAR | Status: DC | PRN

## 2015-07-26 MED ORDER — CHLORHEXIDINE GLUCONATE 4 % EX LIQD: 60.0000 mL | Freq: Once | CUTANEOUS | Status: DC

## 2015-07-26 MED ORDER — METHOCARBAMOL 1000 MG/10ML IJ SOLN
500.0000 mg | Freq: Four times a day (QID) | INTRAVENOUS | Status: DC | PRN
  Administered 2015-07-26: 500 mg via INTRAVENOUS
  Filled 2015-07-26: qty 5
  Filled 2015-07-26: qty 550

## 2015-07-26 MED ORDER — HYDROMORPHONE HCL 2 MG/ML IJ SOLN
INTRAMUSCULAR | Status: AC
  Filled 2015-07-26: qty 1

## 2015-07-26 MED ORDER — PROPOFOL 10 MG/ML IV BOLUS
INTRAVENOUS | Status: AC
  Filled 2015-07-26: qty 20

## 2015-07-26 MED ORDER — KETOROLAC TROMETHAMINE 30 MG/ML IJ SOLN
INTRAMUSCULAR | Status: AC
  Filled 2015-07-26: qty 1

## 2015-07-26 MED ORDER — KETOROLAC TROMETHAMINE 30 MG/ML IJ SOLN
INTRAMUSCULAR | Status: DC | PRN
  Administered 2015-07-26: 30 mg

## 2015-07-26 MED ORDER — FENTANYL CITRATE (PF) 100 MCG/2ML IJ SOLN
INTRAMUSCULAR | Status: DC | PRN
  Administered 2015-07-26 (×6): 50 ug via INTRAVENOUS

## 2015-07-26 MED ORDER — PHENYLEPHRINE HCL 10 MG/ML IJ SOLN
INTRAMUSCULAR | Status: DC | PRN
  Administered 2015-07-26: 120 ug via INTRAVENOUS
  Administered 2015-07-26 (×2): 80 ug via INTRAVENOUS

## 2015-07-26 MED ORDER — POLYETHYLENE GLYCOL 3350 17 G PO PACK
17.0000 g | PACK | Freq: Two times a day (BID) | ORAL | Status: DC
  Administered 2015-07-26 – 2015-07-27 (×2): 17 g via ORAL
  Filled 2015-07-26 (×2): qty 1

## 2015-07-26 MED ORDER — HYDROCODONE-ACETAMINOPHEN 7.5-325 MG PO TABS
1.0000 | ORAL_TABLET | ORAL | Status: DC
  Administered 2015-07-26 – 2015-07-27 (×5): 1 via ORAL
  Administered 2015-07-27 (×2): 2 via ORAL
  Filled 2015-07-26: qty 1
  Filled 2015-07-26: qty 2
  Filled 2015-07-26 (×3): qty 1
  Filled 2015-07-26: qty 2
  Filled 2015-07-26: qty 1

## 2015-07-26 MED ORDER — BUPIVACAINE-EPINEPHRINE (PF) 0.25% -1:200000 IJ SOLN
INTRAMUSCULAR | Status: AC
  Filled 2015-07-26: qty 30

## 2015-07-26 MED ORDER — PHENYLEPHRINE 40 MCG/ML (10ML) SYRINGE FOR IV PUSH (FOR BLOOD PRESSURE SUPPORT)
PREFILLED_SYRINGE | INTRAVENOUS | Status: AC
  Filled 2015-07-26: qty 10

## 2015-07-26 MED ORDER — BUPIVACAINE-EPINEPHRINE (PF) 0.25% -1:200000 IJ SOLN
INTRAMUSCULAR | Status: DC | PRN
  Administered 2015-07-26: 30 mL

## 2015-07-26 MED ORDER — BUPIVACAINE-EPINEPHRINE (PF) 0.5% -1:200000 IJ SOLN
INTRAMUSCULAR | Status: AC
  Filled 2015-07-26: qty 30

## 2015-07-26 MED ORDER — ATORVASTATIN CALCIUM 20 MG PO TABS
20.0000 mg | ORAL_TABLET | Freq: Every day | ORAL | Status: DC
  Administered 2015-07-27: 20 mg via ORAL
  Filled 2015-07-26: qty 1

## 2015-07-26 MED ORDER — SUGAMMADEX SODIUM 200 MG/2ML IV SOLN
INTRAVENOUS | Status: DC | PRN
  Administered 2015-07-26: 175 mg via INTRAVENOUS

## 2015-07-26 MED ORDER — HYDROMORPHONE HCL 1 MG/ML IJ SOLN
0.2500 mg | INTRAMUSCULAR | Status: DC | PRN
  Administered 2015-07-26 (×4): 0.5 mg via INTRAVENOUS

## 2015-07-26 MED ORDER — SUGAMMADEX SODIUM 200 MG/2ML IV SOLN
INTRAVENOUS | Status: AC
  Filled 2015-07-26: qty 2

## 2015-07-26 MED ORDER — ONDANSETRON HCL 4 MG/2ML IJ SOLN
INTRAMUSCULAR | Status: DC | PRN
  Administered 2015-07-26 (×4): 2 mg via INTRAVENOUS

## 2015-07-26 MED ORDER — HYDROMORPHONE HCL 1 MG/ML IJ SOLN
INTRAMUSCULAR | Status: AC
  Filled 2015-07-26: qty 1

## 2015-07-26 MED ORDER — 0.9 % SODIUM CHLORIDE (POUR BTL) OPTIME
TOPICAL | Status: DC | PRN
  Administered 2015-07-26: 1000 mL

## 2015-07-26 MED ORDER — MIDAZOLAM HCL 2 MG/2ML IJ SOLN
INTRAMUSCULAR | Status: AC
Start: 2015-07-26 — End: 2015-07-26
  Filled 2015-07-26: qty 2

## 2015-07-26 MED ORDER — MAGNESIUM CITRATE PO SOLN: 1.0000 | Freq: Once | ORAL | Status: DC | PRN

## 2015-07-26 MED ORDER — OMEPRAZOLE 20 MG PO CPDR
40.0000 mg | DELAYED_RELEASE_CAPSULE | Freq: Every day | ORAL | Status: DC
  Administered 2015-07-27: 40 mg via ORAL
  Filled 2015-07-26: qty 2

## 2015-07-26 MED ORDER — BISACODYL 10 MG RE SUPP: 10.0000 mg | Freq: Every day | RECTAL | Status: DC | PRN

## 2015-07-26 MED ORDER — HYDROMORPHONE HCL 1 MG/ML IJ SOLN
0.5000 mg | INTRAMUSCULAR | Status: DC | PRN
  Filled 2015-07-26: qty 1

## 2015-07-26 MED ORDER — VANCOMYCIN HCL IN DEXTROSE 1-5 GM/200ML-% IV SOLN
1000.0000 mg | Freq: Two times a day (BID) | INTRAVENOUS | Status: AC
  Administered 2015-07-26: 1000 mg via INTRAVENOUS
  Filled 2015-07-26: qty 200

## 2015-07-26 MED ORDER — LIDOCAINE HCL (CARDIAC) 20 MG/ML IV SOLN
INTRAVENOUS | Status: DC | PRN
  Administered 2015-07-26: 80 mg via INTRAVENOUS

## 2015-07-26 MED ORDER — ONDANSETRON HCL 4 MG/2ML IJ SOLN: 4.0000 mg | Freq: Four times a day (QID) | INTRAMUSCULAR | Status: DC | PRN

## 2015-07-26 MED ORDER — ASPIRIN EC 325 MG PO TBEC
325.0000 mg | DELAYED_RELEASE_TABLET | Freq: Two times a day (BID) | ORAL | Status: DC
  Administered 2015-07-27: 325 mg via ORAL
  Filled 2015-07-26: qty 1

## 2015-07-26 MED ORDER — MENTHOL 3 MG MT LOZG: 1.0000 | LOZENGE | OROMUCOSAL | Status: DC | PRN

## 2015-07-26 MED ORDER — VANCOMYCIN HCL IN DEXTROSE 1-5 GM/200ML-% IV SOLN
1000.0000 mg | INTRAVENOUS | Status: AC
  Administered 2015-07-26: 1000 mg via INTRAVENOUS
  Filled 2015-07-26: qty 200

## 2015-07-26 MED ORDER — FERROUS SULFATE 325 (65 FE) MG PO TABS
325.0000 mg | ORAL_TABLET | Freq: Three times a day (TID) | ORAL | Status: DC
  Administered 2015-07-27: 325 mg via ORAL
  Filled 2015-07-26: qty 1

## 2015-07-26 MED ORDER — NON FORMULARY: 40.0000 mg | Freq: Every day | Status: DC

## 2015-07-26 MED ORDER — PROPOFOL 10 MG/ML IV BOLUS
INTRAVENOUS | Status: DC | PRN
  Administered 2015-07-26: 30 mg via INTRAVENOUS
  Administered 2015-07-26: 170 mg via INTRAVENOUS
  Administered 2015-07-26: 30 mg via INTRAVENOUS

## 2015-07-26 SURGICAL SUPPLY — 47 items
BAG DECANTER FOR FLEXI CONT (MISCELLANEOUS) IMPLANT
BAG ZIPLOCK 12X15 (MISCELLANEOUS) IMPLANT
BANDAGE ACE 6X5 VEL STRL LF (GAUZE/BANDAGES/DRESSINGS) ×3 IMPLANT
BLADE SAW SGTL 13.0X1.19X90.0M (BLADE) ×3 IMPLANT
BOWL SMART MIX CTS (DISPOSABLE) ×3 IMPLANT
CAPT KNEE TOTAL 3 ATTUNE ×3 IMPLANT
CEMENT HV SMART SET (Cement) ×6 IMPLANT
CLOTH BEACON ORANGE TIMEOUT ST (SAFETY) ×3 IMPLANT
CUFF TOURN SGL QUICK 34 (TOURNIQUET CUFF) ×2
CUFF TRNQT CYL 34X4X40X1 (TOURNIQUET CUFF) ×1 IMPLANT
DECANTER SPIKE VIAL GLASS SM (MISCELLANEOUS) ×3 IMPLANT
DRAPE U-SHAPE 47X51 STRL (DRAPES) ×3 IMPLANT
DRESSING AQUACEL AG SP 3.5X10 (GAUZE/BANDAGES/DRESSINGS) ×1 IMPLANT
DRSG AQUACEL AG ADV 3.5X10 (GAUZE/BANDAGES/DRESSINGS) ×3 IMPLANT
DRSG AQUACEL AG SP 3.5X10 (GAUZE/BANDAGES/DRESSINGS) ×3
DURAPREP 26ML APPLICATOR (WOUND CARE) ×6 IMPLANT
ELECT REM PT RETURN 9FT ADLT (ELECTROSURGICAL) ×3
ELECTRODE REM PT RTRN 9FT ADLT (ELECTROSURGICAL) ×1 IMPLANT
GLOVE BIOGEL M 7.0 STRL (GLOVE) IMPLANT
GLOVE BIOGEL PI IND STRL 7.5 (GLOVE) ×6 IMPLANT
GLOVE BIOGEL PI IND STRL 8.5 (GLOVE) ×1 IMPLANT
GLOVE BIOGEL PI INDICATOR 7.5 (GLOVE) ×12
GLOVE BIOGEL PI INDICATOR 8.5 (GLOVE) ×2
GLOVE ECLIPSE 8.0 STRL XLNG CF (GLOVE) ×3 IMPLANT
GLOVE ORTHO TXT STRL SZ7.5 (GLOVE) ×6 IMPLANT
GOWN STRL REUS W/TWL LRG LVL3 (GOWN DISPOSABLE) ×3 IMPLANT
GOWN STRL REUS W/TWL XL LVL3 (GOWN DISPOSABLE) ×9 IMPLANT
HANDPIECE INTERPULSE COAX TIP (DISPOSABLE) ×2
LIQUID BAND (GAUZE/BANDAGES/DRESSINGS) ×3 IMPLANT
MANIFOLD NEPTUNE II (INSTRUMENTS) ×3 IMPLANT
PACK TOTAL KNEE CUSTOM (KITS) ×3 IMPLANT
POSITIONER SURGICAL ARM (MISCELLANEOUS) ×3 IMPLANT
SET HNDPC FAN SPRY TIP SCT (DISPOSABLE) ×1 IMPLANT
SET PAD KNEE POSITIONER (MISCELLANEOUS) ×3 IMPLANT
SUCTION FRAZIER HANDLE 12FR (TUBING) ×2
SUCTION TUBE FRAZIER 12FR DISP (TUBING) ×1 IMPLANT
SUT MNCRL AB 4-0 PS2 18 (SUTURE) ×3 IMPLANT
SUT VIC AB 1 CT1 36 (SUTURE) ×3 IMPLANT
SUT VIC AB 2-0 CT1 27 (SUTURE) ×6
SUT VIC AB 2-0 CT1 TAPERPNT 27 (SUTURE) ×3 IMPLANT
SUT VLOC 180 0 24IN GS25 (SUTURE) ×3 IMPLANT
SYR 50ML LL SCALE MARK (SYRINGE) ×3 IMPLANT
TRAY FOLEY W/METER SILVER 14FR (SET/KITS/TRAYS/PACK) IMPLANT
TRAY FOLEY W/METER SILVER 16FR (SET/KITS/TRAYS/PACK) ×3 IMPLANT
WATER STERILE IRR 1500ML POUR (IV SOLUTION) ×3 IMPLANT
WRAP KNEE MAXI GEL POST OP (GAUZE/BANDAGES/DRESSINGS) ×3 IMPLANT
YANKAUER SUCT BULB TIP 10FT TU (MISCELLANEOUS) ×3 IMPLANT

## 2015-07-26 NOTE — Op Note (Signed)
NAME:  Shawn Lambert                      MEDICAL RECORD NO.:  [id number]                             FAC[facility]      PHYSICIAN:  Madlyn FrankelMatthew D. Charlann Boxerlin, M.D.  DATE OF BIRTH:  15-Sep-1940      DATE OF PROCEDURE:  07/26/2015                                     OPERATIVE REPORT         PREOPERATIVE DIAGNOSIS:  Right knee osteoarthritis.      POSTOPERATIVE DIAGNOSIS:  Right knee osteoarthritis.      FINDINGS:  The patient was noted to have complete loss of cartilage and   bone-on-bone arthritis with associated osteophytes in all three compartments of   the knee with a significant synovitis and associated effusion.  With large lateral bone piece in patellar region (?etiology)     PROCEDURE:  Right total knee replacement.      COMPONENTS USED:  DePuy Atttune rotating platform posterior stabilized knee   system, a size 5 standard femur, 6 tibia, size 6 mm PS AOX insert, and 38 anatomic patellar   button.      SURGEON:  Madlyn FrankelMatthew D. Charlann Boxerlin, M.D.      ASSISTANT:  Lanney GinsMatthew Babish, PA-C.      ANESTHESIA:  General and Regional.      SPECIMENS:  None.      COMPLICATION:  None.      DRAINS:  None.  EBL: <100cc      TOURNIQUET TIME:   Total Tourniquet Time Documented: Thigh (Right) - 42 minutes Total: Thigh (Right) - 42 minutes  .      The patient was stable to the recovery room.      INDICATION FOR PROCEDURE:  Shawn Lambert is a 75 y.o. male patient of   mine.  The patient had been seen, evaluated, and treated conservatively in the   office with medication, activity modification, and injections.  The patient had   radiographic changes of bone-on-bone arthritis with endplate sclerosis and osteophytes noted.      The patient failed conservative measures including medication, injections, and activity modification, and at this point was ready for more definitive measures.   Based on the radiographic changes and failed conservative measures, the patient   decided to proceed with total  knee replacement.  Risks of infection,   DVT, component failure, need for revision surgery, postop course, and   expectations were all   discussed and reviewed.  Consent was obtained for benefit of pain   relief.      PROCEDURE IN DETAIL:  The patient was brought to the operative theater.   Once adequate anesthesia, preoperative antibiotics, 2 gm of Ancef, 1 gm of Tranexamic Acid, and 10 mg of Decadron administered, the patient was positioned supine with the right thigh tourniquet placed.  The  right lower extremity was prepped and draped in sterile fashion.  A time-   out was performed identifying the patient, planned procedure, and   extremity.      The right lower extremity [facility] leg holder.  The leg was   exsanguinated, tourniquet elevated to  250 mmHg.  A midline incision was   made followed by median parapatellar arthrotomy.  Following initial   exposure, attention was first directed to the patella.  Precut   measurement was noted to be 18 mm.  I resected down to 14 mm and used a   38 anatomic patellar button to restore patellar height as well as cover the cut   surface.  The larger bone piece lateral to the patella was removed.     The lug holes were drilled and a metal shim was placed to protect the   patella from retractors and saw blades.      At this point, attention was now directed to the femur.  The femoral   canal was opened with a drill, irrigated to try to prevent fat emboli.  An   intramedullary rod was passed at 5 degrees valgus, 9 mm of bone was   resected off the distal femur.  Following this resection, the tibia was   subluxated anteriorly.  Using the extramedullary guide, 2 mm of bone was resected off   the proximal lateral tibia.  We confirmed the gap would be   stable medially and laterally with a size 5 mm insert as well as confirmed   the cut was perpendicular in the coronal plane, checking with an alignment rod.      Once this was done, I  sized the femur to be a size 5 in the anterior-   posterior dimension, chose a standard component based on medial and   lateral dimension.  The size 5 rotation block was then pinned in   position anterior referenced using the C-clamp to set rotation.  The   anterior, posterior, and  chamfer cuts were made without difficulty nor   notching making certain that I was along the anterior cortex to help   with flexion gap stability.      The final box cut was made off the lateral aspect of distal femur.      At this point, the tibia was sized to be a size 6, the size 6 tray was   then pinned in position through the medial third of the tubercle,   drilled, and keel punched.  Trial reduction was now carried with a 5 femur,  6 tibia, a size 6 mm insert, and the 38 patella botton.  The knee was brought to   extension, full extension with good flexion stability with the patella   tracking through the trochlea without application of pressure.  Given   all these findings the femoral lug holes were drilled and then the trial components removed.  Final components were   opened and cement was mixed.  The knee was irrigated with normal saline   solution and pulse lavage.  The synovial lining was   then injected with 30 cc of 0.25% Marcaine with epinephrine and 1 cc of Toradol plus 30 cc of NS for a  total of 61 cc.      The knee was irrigated.  Final implants were then cemented onto clean and   dried cut surfaces of bone with the knee brought to extension with a size 6 mm trial insert.      Once the cement had fully cured, the excess cement was removed   throughout the knee.  I confirmed I was satisfied with the range of   motion and stability, and the final size 6 mm PS AOX insert was chosen.  It was  placed into the knee.      The tourniquet had been let down at 42 minutes.  No significant   hemostasis required.  The   extensor mechanism was then reapproximated using #1 Vicryl and #0 V-lock sutures  with the knee   in flexion.  The   remaining wound was closed with 2-0 Vicryl and running 4-0 Monocryl.   The knee was cleaned, dried, dressed sterilely using Dermabond and   Aquacel dressing.  The patient was then   brought to recovery room in stable condition, tolerating the procedure   well.   Please note that Physician Assistant, Danae Orleans, PA-C, was present for the entirety of the case, and was utilized for pre-operative positioning, peri-operative retractor management, general facilitation of the procedure.  He was also utilized for primary wound closure at the end of the case.              Pietro Cassis Alvan Dame, M.D.    07/26/2015 3:15 PM

## 2015-07-26 NOTE — Anesthesia Preprocedure Evaluation (Signed)
Anesthesia Evaluation  Patient identified by MRN, date of birth, ID band Patient awake    Reviewed: Allergy & Precautions, NPO status , Patient's Chart, lab work & pertinent test results  Airway Mallampati: II  TM Distance: >3 FB Neck ROM: Full    Dental   Pulmonary    Pulmonary exam normal  + decreased breath sounds      Cardiovascular hypertension, + angina + CAD  Normal cardiovascular exam+ dysrhythmias      Neuro/Psych    GI/Hepatic   Endo/Other    Renal/GU Renal disease     Musculoskeletal  (+) Arthritis ,   Abdominal   Peds  Hematology   Anesthesia Other Findings   Reproductive/Obstetrics                             Anesthesia Physical Anesthesia Plan  ASA: III  Anesthesia Plan: General   Post-op Pain Management:    Induction: Intravenous  Airway Management Planned: Oral ETT  Additional Equipment:   Intra-op Plan:   Post-operative Plan: Extubation in OR  Informed Consent: I have reviewed the patients History and Physical, chart, labs and discussed the procedure including the risks, benefits and alternatives for the proposed anesthesia with the patient or authorized representative who has indicated his/her understanding and acceptance.     Plan Discussed with: CRNA, Anesthesiologist and Surgeon  Anesthesia Plan Comments:         Anesthesia Quick Evaluation

## 2015-07-26 NOTE — Progress Notes (Signed)
Asssisted Dr. Sherren Kerns with right, ultrasound guided, adductor canal block. Side rails up, monitors on throughout procedure. See vital signs in flow sheet. Tolerated Procedure well.

## 2015-07-26 NOTE — Transfer of Care (Signed)
Immediate Anesthesia Transfer of Care Note  Patient: Shawn Lambert  Procedure(s) Performed: Procedure(s): RIGHT TOTAL KNEE ARTHROPLASTY (Right)  Patient Location: PACU  Anesthesia Type:General  Level of Consciousness:  sedated, patient cooperative and responds to stimulation  Airway & Oxygen Therapy:Patient Spontanous Breathing and Patient connected to face mask oxgen  Post-op Assessment:  Report given to PACU RN and Post -op Vital signs reviewed and stable  Post vital signs:  Reviewed and stable  Last Vitals:  Filed Vitals:   07/26/15 1215 07/26/15 1216  BP: 149/88   Pulse: 68 65  Temp:    Resp: 18 15    Complications: No apparent anesthesia complications

## 2015-07-26 NOTE — Progress Notes (Signed)
Utilization review completed.  

## 2015-07-26 NOTE — Anesthesia Postprocedure Evaluation (Signed)
Anesthesia Post Note  Patient: Shawn Lambert  Procedure(s) Performed: Procedure(s) (LRB): RIGHT TOTAL KNEE ARTHROPLASTY (Right)  Patient location during evaluation: PACU Anesthesia Type: General Level of consciousness: awake, sedated and patient cooperative Pain management: pain level controlled Vital Signs Assessment: post-procedure vital signs reviewed and stable Respiratory status: spontaneous breathing and respiratory function stable Cardiovascular status: stable Anesthetic complications: no    Last Vitals:  Filed Vitals:   07/26/15 1500 07/26/15 1515  BP: 156/74 157/77  Pulse: 85 85  Temp:    Resp: 10 10    Last Pain:  Filed Vitals:   07/26/15 1518  PainSc: 9                  Colbi Staubs EDWARD

## 2015-07-26 NOTE — Anesthesia Procedure Notes (Signed)
Procedure Name: Intubation Date/Time: 07/26/2015 1:11 PM Performed by: Freddie Breech Pre-anesthesia Checklist: Patient identified, Emergency Drugs available, Suction available, Timeout performed and Patient being monitored Patient Re-evaluated:Patient Re-evaluated prior to inductionOxygen Delivery Method: Circle system utilized Preoxygenation: Pre-oxygenation with 100% oxygen Intubation Type: IV induction Ventilation: Mask ventilation without difficulty Laryngoscope Size: Mac and 4 Grade View: Grade II Tube type: Oral Tube size: 7.0 mm Number of attempts: 1 Airway Equipment and Method: Patient positioned with wedge pillow and Stylet Placement Confirmation: ETT inserted through vocal cords under direct vision,  CO2 detector,  positive ETCO2 and breath sounds checked- equal and bilateral Secured at: 22 cm Tube secured with: Tape Dental Injury: Teeth and Oropharynx as per pre-operative assessment

## 2015-07-26 NOTE — Discharge Instructions (Signed)

## 2015-07-27 LAB — CBC
HCT: 29 % — ABNORMAL LOW (ref 39.0–52.0)
HEMOGLOBIN: 9.8 g/dL — AB (ref 13.0–17.0)
MCH: 30.5 pg (ref 26.0–34.0)
MCHC: 33.8 g/dL (ref 30.0–36.0)
MCV: 90.3 fL (ref 78.0–100.0)
Platelets: 206 10*3/uL (ref 150–400)
RBC: 3.21 MIL/uL — AB (ref 4.22–5.81)
RDW: 12.9 % (ref 11.5–15.5)
WBC: 8.1 10*3/uL (ref 4.0–10.5)

## 2015-07-27 LAB — BASIC METABOLIC PANEL
ANION GAP: 9 (ref 5–15)
BUN: 21 mg/dL — ABNORMAL HIGH (ref 6–20)
CHLORIDE: 106 mmol/L (ref 101–111)
CO2: 25 mmol/L (ref 22–32)
CREATININE: 1.14 mg/dL (ref 0.61–1.24)
Calcium: 8.7 mg/dL — ABNORMAL LOW (ref 8.9–10.3)
GFR calc non Af Amer: >60 mL/min (ref >60)
Glucose, Bld: 163 mg/dL — ABNORMAL HIGH (ref 65–99)
POTASSIUM: 4.6 mmol/L (ref 3.5–5.1)
SODIUM: 140 mmol/L (ref 135–145)

## 2015-07-27 MED ORDER — HYDROCODONE-ACETAMINOPHEN 7.5-325 MG PO TABS: 1.0000 | ORAL_TABLET | ORAL | Status: DC | PRN

## 2015-07-27 MED ORDER — FERROUS SULFATE 325 (65 FE) MG PO TABS: 325.0000 mg | ORAL_TABLET | Freq: Three times a day (TID) | ORAL | Status: DC

## 2015-07-27 MED ORDER — TIZANIDINE HCL 4 MG PO TABS: 4.0000 mg | ORAL_TABLET | Freq: Four times a day (QID) | ORAL | Status: DC | PRN

## 2015-07-27 MED ORDER — DOCUSATE SODIUM 100 MG PO CAPS: 100.0000 mg | ORAL_CAPSULE | Freq: Two times a day (BID) | ORAL | Status: DC

## 2015-07-27 MED ORDER — POLYETHYLENE GLYCOL 3350 17 G PO PACK: 17.0000 g | PACK | Freq: Two times a day (BID) | ORAL | Status: DC

## 2015-07-27 MED ORDER — ASPIRIN 325 MG PO TBEC: 325.0000 mg | DELAYED_RELEASE_TABLET | Freq: Two times a day (BID) | ORAL | Status: AC

## 2015-07-27 NOTE — Care Management Note (Signed)
Case Management Note  Patient Details  Name: Shawn Lambert MRN: [id number] Date of Birth: 25-May-1940  Subjective/Objective: right TKA                   Action/Plan: Home with OP PT at MD office   Expected Discharge Date:    07/27/15              Expected Discharge Plan:  Home/Self Care (OP PT at MD office)  In-House Referral:     Discharge planning Services  CM Consult  Post Acute Care Choice:  Durable Medical Equipment Choice offered to:  Patient  DME Arranged:  [facility] Rile rolling DME Agency:  [place]:    [place]:     Status of Service:  Completed, signed off  Medicare Important Message Given:    Date Medicare IM Given:    Medicare IM give by:    Date Additional Medicare IM Given:    Additional Medicare Important Message give by:     If discussed at [place] of Stay Meetings, dates discussed:    Additional CommentsPurcell Mouton, RN 07/27/2015, 4:11 PM

## 2015-07-27 NOTE — Evaluation (Signed)
Occupational Therapy Evaluation Patient Details Name: Shawn [place] MRN: [id number] DOB: 15-Dec-1940 Today's Date: 07/27/2015    History of Present Illness s/p R TKA: PMH L TKA   Clinical Impression   OT education complete.  Pts wife will A as needed    Follow Up Recommendations       Equipment Recommendations  None recommended by OT    Recommendations for Other Services       Precautions / Restrictions Precautions Precautions: Knee Restrictions Weight Bearing Restrictions: No Other Position/Activity Restrictions: WBAT      Mobility Bed Mobility Overal bed mobility: Needs Assistance Bed Mobility: Supine to Sit     Supine to sit: Supervision     General bed mobility comments: pt in chair  Transfers Overall transfer level: Needs assistance Equipment used: Rolling walker (2 wheeled) Transfers: Sit to/from [facility] Sit to Stand: Min guard Stand pivot transfers: Min guard       General transfer comment: cues for hand placement and safety         ADL Overall ADL's : Needs assistance/impaired     Grooming: Standing;Supervision/safety   Upper Body Bathing: Sitting;Set up   Lower Body Bathing: Set up;Cueing for safety;Cueing for sequencing;Min guard   Upper Body Dressing : Sitting;Set up   Lower Body Dressing: Min guard;Sit to/from stand   Toilet Transfer: RW;Ambulation;Regular Toilet;Comfort height toilet;Cueing for safety;Min guard   Toileting- Water quality scientist and Hygiene: Min guard;Sit to/from stand         General ADL Comments: wife will A as needed     Vision     Perception     Praxis      Pertinent Vitals/Pain Pain Assessment: 0-10 Pain Score: 6  Pain Location:  r knee Pain Descriptors / Indicators: Sore Pain Intervention(s): Repositioned;Ice applied     Hand Dominance     Extremity/Trunk Assessment Upper Extremity Assessment Upper Extremity Assessment: Overall WFL for tasks assessed   Lower  Extremity Assessment Lower Extremity Assessment: RLE deficits/detail       Communication Communication Communication: No difficulties   Cognition Arousal/Alertness: Awake/alert Behavior During Therapy: WFL for tasks assessed/performed Overall Cognitive Status: Within Functional Limits for tasks assessed                                Home Living Family/patient expects to be discharged to:: Private residence Living Arrangements: Spouse/significant other   Type of Home: House Home Access: Stairs to enter [employer] of Steps: 4 Entrance Stairs-Rails: Right;Left Home Layout: Two level;Able to live on main level with bedroom/bathroom     Bathroom Shower/Tub: Occupational psychologist: Standard     Home Equipment: None          Prior Functioning/Environment Level of Independence: Independent                      OT Goals(Current goals can be found in the care plan section) Acute Rehab OT Goals Patient Stated Goal: return to independence OT Goal Formulation: With patient Potential to Achieve Goals: Good  OT Frequency:      End of Session Equipment Utilized During Treatment: Rolling walker Nurse Communication: Mobility status  Activity Tolerance: Patient tolerated treatment well Patient left: in chair   Time: 1020-1044 OT Time Calculation (min): 24 min Charges:  OT General Charges $OT Visit: 1 Procedure OT Evaluation $OT Eval Low Complexity: 1 Procedure OT Treatments $Self  Care/Home Management : 8-22 mins G-Codes:    Payton Mccallum D 08/09/2015, 10:46 AM

## 2015-07-27 NOTE — Progress Notes (Signed)
     Subjective: 1 Day Post-Op Procedure(s) (LRB): RIGHT TOTAL KNEE ARTHROPLASTY (Right)   Patient reports pain as mild, pain controlled.  No events throughout the night.  Stood up yesterday and the knee felt good.  Ready to be discharged home if he does well with PT.  Objective:   VITALS:    07/27/15  BP: 129/63  Pulse: 76  Temp: 97.6 F (36.4 C)   Resp: 16    Dorsiflexion/Plantar flexion intact Incision: dressing C/D/I No cellulitis present Compartment soft  LABS  Recent Labs  07/27/15 0414  HGB 9.8*  HCT 29.0*  WBC 8.1  PLT 206     Recent Labs  07/27/15 0414  NA 140  K 4.6  BUN 21*  CREATININE 1.14  GLUCOSE 163*     Assessment/Plan: 1 Day Post-Op Procedure(s) (LRB): RIGHT TOTAL KNEE ARTHROPLASTY (Right) Foley cath d/c'ed Advance diet Up with therapy D/C IV fluids Discharge home with home health Follow up in 2 weeks at [facility]. Follow up with OLIN,Makaley Storts D in 2 weeks.  Contact information:  [facility] [address], [place] [id number]    Overweight (BMI 25-29.9) Estimated body mass index is 28.9 kg/(m^2) as calculated from the following:   Height as of this encounter: 5\' 8"  (1.727 m).   Weight as of this encounter: 86.183 kg (190 lb). Patient also counseled that weight may inhibit the healing process Patient counseled that losing weight will help with future health issues        [place] Pugh. Mykaela Arena   PAC  07/27/2015, 9:20 AM

## 2015-07-27 NOTE — Evaluation (Signed)
Physical Therapy Evaluation Patient Details Name: Shawn MaxcyDavid G Schaum MRN: [id number] DOB: [birth year] Today's Date: 07/27/2015   History of Present Illness  s/p R TKA: PMH L TKA  Clinical Impression  Pt is s/p TKA resulting in the deficits listed below (see PT Problem List).   Pt will benefit from skilled PT to increase their independence and safety with mobility to allow discharge to the venue listed below.  Will see second session today for stair training prior to D/C; pt is at risk for falls, would recommend HHPT however pt is already set up for OPPT     Follow Up Recommendations Outpatient PT;Home health PT (would recommend HHPT, pt sched for OPPT)    Equipment Recommendations  Rolling walker with 5" wheels;3in1 (PT)    Recommendations for Other Services       Precautions / Restrictions Precautions Precautions: Knee Restrictions Weight Bearing Restrictions: No Other Position/Activity Restrictions: WBAT      Mobility  Bed Mobility Overal bed mobility: Needs Assistance Bed Mobility: Supine to Sit     Supine to sit: Supervision     General bed mobility comments: for safety, incr time  Transfers Overall transfer level: Needs assistance Equipment used: Rolling walker (2 wheeled) Transfers: Sit to/from Stand Sit to Stand: Min assist         General transfer comment: cues for hand placement and safety  Ambulation/Gait Ambulation/Gait assistance: Min guard Ambulation Distance (Feet): 80 Feet Assistive device: Rolling walker (2 wheeled) Gait Pattern/deviations: Step-to pattern;Antalgic;Trunk flexed;Decreased step length - right;Decreased step length - left     General Gait Details: cues for sequence, RW position, safety  Stairs            Wheelchair Mobility    Modified Rankin (Stroke Patients Only)       Balance                                             Pertinent Vitals/Pain Pain Assessment: 0-10 Pain Score: 5  Pain  Location: right knee Pain Descriptors / Indicators: Sore Pain Intervention(s): Premedicated before session;Monitored during session;Limited activity within patient's tolerance    Home Living Family/patient expects to be discharged to:: Private residence Living Arrangements: Spouse/significant other   Type of Home: House Home Access: Stairs to enter Entrance Stairs-Rails: Doctor, general practiceight;Left Entrance Stairs-Number of Steps: 4 Home Layout: Two level;Able to live on main level with bedroom/bathroom Home Equipment: None      Prior Function Level of Independence: Independent               Hand Dominance        Extremity/Trunk Assessment   Upper Extremity Assessment: Defer to OT evaluation           Lower Extremity Assessment: RLE deficits/detail         Communication   Communication: No difficulties  Cognition Arousal/Alertness: Awake/alert Behavior During Therapy: WFL for tasks assessed/performed Overall Cognitive Status: Within Functional Limits for tasks assessed                      General Comments      Exercises Total Joint Exercises Ankle Circles/Pumps: AROM;Both;10 reps Quad Sets: 10 reps;Both;AROM      Assessment/Plan    PT Assessment Patient needs continued PT services  PT Diagnosis Difficulty walking   PT Problem List Decreased strength;Decreased activity tolerance;Decreased mobility;Decreased knowledge of  precautions;Decreased range of motion  PT Treatment Interventions DME instruction;Gait training;Functional mobility training;Therapeutic activities;Therapeutic exercise;Patient/family education   PT Goals (Current goals can be found in the Care Plan section) Acute Rehab PT Goals Patient Stated Goal: return to independence PT Goal Formulation: With patient Time For Goal Achievement: 08/01/15 Potential to Achieve Goals: Good    Frequency 7X/week   Barriers to discharge        Co-evaluation               End of Session  Equipment Utilized During Treatment: Gait belt Activity Tolerance: Patient tolerated treatment well Patient left: in chair;with call bell/phone within reach;with chair alarm set           Time: [id number] PT Time Calculation (min) (ACUTE ONLY): 15 min   Charges:   PT Evaluation $PT Eval Low Complexity: 1 Procedure     PT G Codes:        Hutch Rhett 2015/08/03, 9:54 AM

## 2015-07-27 NOTE — Progress Notes (Signed)
[facility]    [facility] is providing the following services: RW and Commode  If patient discharges after hours, please call [phone].   Linward Headland 07/27/2015, 12:42 PM

## 2015-07-27 NOTE — Progress Notes (Signed)
   07/27/15 1200  PT Visit Information  Last PT Received On 07/27/15  Assistance Needed +1  History of Present Illness s/p R TKA: PMH L TKA  Subjective Data  Patient Stated Goal return to independence  Precautions  Precautions Knee  Restrictions  Weight Bearing Restrictions No  Other Position/Activity Restrictions WBAT  Pain Assessment  Pain Assessment 0-10  Pain Score 2  Pain Location R knee  Pain Descriptors / Indicators Sore  Pain Intervention(s) Limited activity within patient's tolerance;Monitored during session;Ice applied  Cognition  Arousal/Alertness Awake/alert  Behavior During Therapy WFL for tasks assessed/performed  Overall Cognitive Status Within Functional Limits for tasks assessed  Bed Mobility  General bed mobility comments pt in chair  Transfers  Overall transfer level Needs assistance  Equipment used Rolling walker (2 wheeled)  Transfers Sit to/from Stand  Sit to Stand Supervision  General transfer comment cues for hand placement and safety  Ambulation/Gait  Ambulation/Gait assistance Supervision;Min guard  Ambulation Distance (Feet) 110 Feet (20')  Assistive device Rolling walker (2 wheeled)  Gait Pattern/deviations Step-to pattern;Trunk flexed;Decreased step length - right;Decreased step length - left  General Gait Details cues for sequence, RW position, safety  Balance  Overall balance assessment Needs assistance  Standing balance-Leahy Scale Poor  Standing balance comment reliant on UEs  Total Joint Exercises  Ankle Circles/Pumps AROM;Both;10 reps  Quad Sets 10 reps;Both;AROM  Short Arc Quad AROM;Strengthening;Right;10 reps  Heel Slides AROM;Right;10 reps  Hip ABduction/ADduction AROM;Strengthening;Right;10 reps  Straight Leg Raises AROM;AAROM;Strengthening;Right;10 reps  PT - End of Session  Equipment Utilized During Treatment Gait belt  Activity Tolerance Patient tolerated treatment well  Patient left in chair;with call bell/phone within  reach;with chair alarm set  PT - Assessment/Plan  PT Plan Current plan remains appropriate  PT Frequency (ACUTE ONLY) 7X/week  Follow Up Recommendations Outpatient PT;Home health PT  PT equipment Rolling walker with 5" wheels;3in1 (PT)  PT Goal Progression  Progress towards PT goals Progressing toward goals  Acute Rehab PT Goals  PT Goal Formulation With patient  Time For Goal Achievement 08/01/15  Potential to Achieve Goals Good  PT Time Calculation  PT Start Time (ACUTE ONLY) 1139  PT Stop Time (ACUTE ONLY) 1206  PT Time Calculation (min) (ACUTE ONLY) 27 min  PT General Charges  $$ ACUTE PT VISIT 1 Procedure  PT Treatments  $Gait Training 8-22 mins  $Therapeutic Exercise 8-22 mins

## 2015-07-29 ENCOUNTER — Ambulatory Visit: Payer: Self-pay | Admitting: Physical Therapy

## 2015-08-01 ENCOUNTER — Ambulatory Visit: Payer: Medicare Other | Attending: Orthopedic Surgery | Admitting: Physical Therapy

## 2015-08-01 ENCOUNTER — Encounter: Payer: Self-pay | Admitting: Physical Therapy

## 2015-08-01 DIAGNOSIS — R601 Generalized edema: Secondary | ICD-10-CM | POA: Diagnosis not present

## 2015-08-01 DIAGNOSIS — M25661 Stiffness of right knee, not elsewhere classified: Secondary | ICD-10-CM

## 2015-08-01 DIAGNOSIS — M25561 Pain in right knee: Secondary | ICD-10-CM | POA: Diagnosis not present

## 2015-08-01 NOTE — Therapy (Signed)
[facility] [place], [place], [address] Phone: [phone]   Fax:  [phone]  Physical Therapy Evaluation  Patient Details  Name: Shawn Lambert MRN: [id number] Date of Birth: 03/28/1940 Referring Provider: Paralee Cancel, MD  Encounter Date: 08/01/2015      PT End of Session - 08/01/15 1350    Visit Number 1   Number of Visits 12   Date for PT Re-Evaluation 09/12/15   PT Start Time 1350   PT Stop Time 1438   PT Time Calculation (min) 48 min   Activity Tolerance Patient tolerated treatment well   Behavior During Therapy [facility] for tasks assessed/performed      Past Medical History  Diagnosis Date  . Kidney stones   . Hypertension   . Hyperlipidemia   . GERD (gastroesophageal reflux disease)   . Anginal pain ([place])   . Dysrhythmia     pt. states physician said " he has a irregular heart beat"  . Arthritis   . Cancer ([place])     basil cell skin cancer    Past Surgical History  Procedure Laterality Date  . Total knee arthroplasty    . Eye surgery Right 1965    Prosthetic Eye  . Total knee arthroplasty Right 07/26/2015    Procedure: RIGHT TOTAL KNEE ARTHROPLASTY;  Surgeon: Shawn Cancel, MD;  Location: [facility];  Service: Orthopedics;  Laterality: Right;    There were no vitals filed for this visit.       Subjective Assessment - 08/01/15 1345    Subjective Patient underwent R TKA on 07/26/15. Patient states that he had a very sharp pain in the middle of the night and then this morning could hardly walk. Better as day went on. Pain 1-2 at rest; 7-8 with amb.   Pertinent History HTN. Lt TKA 2008, blind in R eye   Patient Stated Goals Decrease pain , increase movement   Currently in Pain? Yes   Pain Score 8    Pain Location Knee   Pain Orientation Right   Pain Descriptors / Indicators Tightness   Pain Type Surgical pain   Pain Radiating Towards lower and upper leg   Pain Onset In the past 7 days   Pain Frequency  Constant   Aggravating Factors  walkkng and standing   Pain Relieving Factors rest   Effect of Pain on Daily Activities limited ADLS            [facility] PT Assessment - 08/01/15 0001    Assessment   Medical Diagnosis s/p R TKA   Referring Provider Shawn Cancel, MD   Onset Date/Surgical Date 07/26/15   Precautions   Precaution Comments Blind in R eye   Balance Screen   Has the patient fallen in the past 6 months No   Has the patient had a decrease in activity level because of a fear of falling?  Yes   Is the patient reluctant to leave their home because of a fear of falling?  Yes   [place] Private residence   Living Arrangements Spouse/significant other   Available Help at [place] to enter   [place] of Steps 4   [place] reach both  patient states no problem   Research scientist (life sciences) - 2 wheels;Cane - quad   Prior Function   Level of Independence Independent with household mobility with device   Vocation Retired   Observation/Other  Assessments   Focus on Therapeutic Outcomes (FOTO)  78% limited   Observation/Other Assessments-Edema    Edema Circumferential   Circumferential Edema   Circumferential - Right 47   Circumferential - Left  40.5   ROM / Strength   AROM / PROM / Strength AROM;Strength;PROM   AROM   AROM Assessment Site Knee   Right/Left Knee Right   Right Knee Extension -10   Right Knee Flexion 76   PROM   PROM Assessment Site Knee   Right/Left Knee Right   Right Knee Extension -8   Right Knee Flexion 82   Strength   Strength Assessment Site Hip;Knee   Right/Left Hip Right   Right Hip Flexion 4-/5   Right Hip Extension 4/5   Right Hip ABduction 4-/5   Right Hip ADduction 4+/5   Right/Left Knee Right   Right Knee Flexion 5/5   Right Knee Extension 4/5   Palpation   Patella mobility med/lat wnl; sup/inf decreased   Palpation comment R HS, lateral leg/ITB,  calf (mild)   Transfers   Transfers Independent with all Transfers   Ambulation/Gait   Ambulation Distance (Feet) 30 Feet   Assistive device Rolling walker   Gait Pattern Decreased stance time - right;Decreased step length - left   Gait Comments externally rotates R hip with amb                   [facility] Adult PT Treatment/Exercise - 08/01/15 0001    Modalities   Modalities Electrical Stimulation;Vasopneumatic   Electrical Stimulation   Electrical Stimulation Location R knee IFC 1-10 Hz to tolerance x 15 min   Electrical Stimulation Goals Edema;Pain   Vasopneumatic   Number Minutes Vasopneumatic  15 minutes   Vasopnuematic Location  Knee   Vasopneumatic Pressure Medium   Vasopneumatic Temperature  34                PT Education - 08/01/15 1428    Education provided Yes   Education Details continue with HEP   Person(s) Educated Patient;Spouse   Methods Explanation   Comprehension Verbalized understanding;Returned demonstration             PT Long Term Goals - 08/01/15 1533    PT LONG TERM GOAL #1   Title I with HEP   Time 4   Period Weeks   Status New   PT LONG TERM GOAL #2   Title Improved R knee ROM 0-120 degrees to improve function6   Time 6   Period Weeks   Status New   PT LONG TERM GOAL #3   Title Patient able to amb 500 feet safely without AD.   Time 6   Period Weeks   Status New   PT LONG TERM GOAL #4   Title demo 4+/5 RLE strength to improve function.   Time 6   Period Weeks   Status New   PT LONG TERM GOAL #5   Title decreased edema to within 2 cm or less of L knee   Time 6   Period Weeks   Status New               Plan - 08/01/15 1428    Clinical Impression Statement Patient had R TKA 07/26/15. He amb with RW. He has pain and deficits in strength and ROM affecting ADLS.   Rehab Potential Excellent   PT Frequency 3x / week   PT Duration 6 weeks   PT Treatment/Interventions ADLs/Self Care  Home  Management;Cryotherapy;Occupational psychologist;Therapeutic exercise;Vasopneumatic Device;Manual techniques;Patient/family education;Passive range of motion;Scar mobilization;Neuromuscular re-education;Balance training   PT Next Visit Plan TKA protocol. Modalities for edema/pain.   PT Home Exercise Plan continue with QS, HS, SLR, heel slides, elevation/ice   Consulted and Agree with Plan of Care Patient      Patient will benefit from skilled therapeutic intervention in order to improve the following deficits and impairments:  Abnormal gait, Decreased range of motion, Pain, Increased edema, Decreased strength  Visit Diagnosis: Stiffness of right knee, not elsewhere classified - Plan: PT plan of care cert/re-cert  Pain in right knee - Plan: PT plan of care cert/re-cert  Generalized edema - Plan: PT plan of care cert/re-cert      G-Codes - [id number]    Functional Assessment Tool Used FOTO   Functional Limitation Mobility: Walking and moving around   Mobility: Walking and Moving Around Current Status [phone]) At least 60 percent but less than 80 percent impaired, limited or restricted   Mobility: Walking and Moving Around Goal Status [phone]) At least 40 percent but less than 60 percent impaired, limited or restricted       Problem List Patient Active Problem List   Diagnosis Date Noted  . S/P right TKA 07/26/2015  . S/P knee replacement 07/26/2015  . HYPERTENSION, HEART CONTROLLED W/O ASSOC CHF 07/12/2009  . Hyperlipidemia LDL goal <130 06/15/2009  . GERD 06/15/2009  . Osteoarthritis of both knees 06/15/2009  . ABNORMAL STRESS ELECTROCARDIOGRAM 06/15/2009    Madelyn Flavors PT  08/01/2015, 3:37 PM  [facility] [address] [place], [place], [address] Phone: [phone]   Fax:  [phone]  Name: BREY PEERSON MRN: [id number] Date of Birth: [birth year]

## 2015-08-02 ENCOUNTER — Encounter: Payer: Self-pay | Admitting: Physical Therapy

## 2015-08-02 ENCOUNTER — Ambulatory Visit: Payer: Medicare Other | Admitting: Physical Therapy

## 2015-08-02 DIAGNOSIS — M25561 Pain in right knee: Secondary | ICD-10-CM

## 2015-08-02 DIAGNOSIS — M25661 Stiffness of right knee, not elsewhere classified: Secondary | ICD-10-CM

## 2015-08-02 DIAGNOSIS — R601 Generalized edema: Secondary | ICD-10-CM | POA: Diagnosis not present

## 2015-08-02 NOTE — Discharge Summary (Signed)
Physician Discharge Summary  Patient ID: Shawn Lambert MRN: [id number] DOB/AGE: 75-Jun-1942 75 y.o.  Admit date: 07/26/2015 Discharge date: 07/27/2015   Procedures:  Procedure(s) (LRB): RIGHT TOTAL KNEE ARTHROPLASTY (Right)  Attending Physician:  Dr. Paralee Cancel   Admission Diagnoses:   Right knee primary OA / pain  Discharge Diagnoses:  Principal Problem:   S/P right TKA Active Problems:   S/P knee replacement  Past Medical History  Diagnosis Date  . Kidney stones   . Hypertension   . Hyperlipidemia   . GERD (gastroesophageal reflux disease)   . Anginal pain ([place])   . Dysrhythmia     pt. states physician said " he has a irregular heart beat"  . Arthritis   . Cancer ([place])     basil cell skin cancer    HPI:    Shawn Lambert, 75 y.o. male, has a history of pain and functional disability in the right knee due to arthritis and has failed non-surgical conservative treatments for greater than 12 weeks to include NSAID's and/or analgesics, corticosteriod injections, viscosupplementation injections and activity modification. Onset of symptoms was gradual, starting 9+ years ago with gradually worsening course since that time. The patient noted prior procedures on the knee to include arthroplasty on the left knee per Dr. Veverly Fells in 2008. Patient currently rates pain in the right knee(s) at 10 out of 10 with activity. Patient has worsening of pain with activity and weight bearing, pain that interferes with activities of daily living, pain with passive range of motion, crepitus and joint swelling. Patient has evidence of periarticular osteophytes and joint space narrowing by imaging studies. There is no active infection. Risks, benefits and expectations were discussed with the patient. Risks including but not limited to the risk of anesthesia, blood clots, nerve damage, blood vessel damage, failure of the prosthesis, infection and up to and including death. Patient understand the  risks, benefits and expectations and wishes to proceed with surgery.  PCP: Redge Gainer, MD   Discharged Condition: good  Hospital Course:  Patient underwent the above stated procedure on 07/26/2015. Patient tolerated the procedure well and brought to the recovery room in good condition and subsequently to the floor.  POD #1 BP: 129/63 ; Pulse: 76 ; Temp: 97.6 F (36.4 C) ; Resp: 16 Patient reports pain as mild, pain controlled. No events throughout the night. Stood up yesterday and the knee felt good. Ready to be discharged home. Dorsiflexion/plantar flexion intact, incision: dressing C/D/I, no cellulitis present and compartment soft.   LABS  Basename    HGB     9.8  HCT     29.0    Discharge Exam: General appearance: alert, cooperative and no distress Extremities: Homans sign is negative, no sign of DVT, no edema, redness or tenderness in the calves or thighs and no ulcers, gangrene or trophic changes  Disposition: Home with follow up in 2 weeks   Follow-up Information    Follow up with Mauri Pole, MD. Schedule an appointment as soon as possible for a visit in 2 weeks.   Specialty:  Orthopedic Surgery   Contact information:   [address] [place] [address] [id number]       Discharge Instructions    Call MD / Call 911    Complete by:  As directed   If you experience chest pain or shortness of breath, CALL 911 and be transported to the hospital emergency room.  If you develope a fever above 101  F, pus (white drainage) or increased drainage or redness at the wound, or calf pain, call your surgeon's office.     Change dressing    Complete by:  As directed   Maintain surgical dressing until follow up in the clinic. If the edges start to pull up, may reinforce with tape. If the dressing is no longer working, may remove and cover with gauze and tape, but must keep the area dry and clean.  Call with any questions or concerns.     Constipation  Prevention    Complete by:  As directed   Drink plenty of fluids.  Prune juice may be helpful.  You may use a stool softener, such as Colace (over the counter) 100 mg twice a day.  Use MiraLax (over the counter) for constipation as needed.     Diet - low sodium heart healthy    Complete by:  As directed      Discharge instructions    Complete by:  As directed   Maintain surgical dressing until follow up in the clinic. If the edges start to pull up, may reinforce with tape. If the dressing is no longer working, may remove and cover with gauze and tape, but must keep the area dry and clean.  Follow up in 2 weeks at [facility]. Call with any questions or concerns.     Increase activity slowly as tolerated    Complete by:  As directed   Weight bearing as tolerated with assist device (walker, cane, etc) as directed, use it as long as suggested by your surgeon or therapist, typically at least 4-6 weeks.     TED hose    Complete by:  As directed   Use stockings (TED hose) for 2 weeks on both leg(s).  You may remove them at night for sleeping.             Medication List    STOP taking these medications        meloxicam 15 MG tablet  Commonly known as:  MOBIC      TAKE these medications        amLODipine 5 MG tablet  Commonly known as:  NORVASC  Take 1 tablet (5 mg total) by mouth daily.     aspirin 325 MG EC tablet  Take 1 tablet (325 mg total) by mouth 2 (two) times daily.     atorvastatin 20 MG tablet  Commonly known as:  LIPITOR  Take 1 tablet (20 mg total) by mouth daily.     Calcium-Vitamin D 600-200 MG-UNIT Caps  Take 2 tablets by mouth daily. Reported on 08/01/2015     cholecalciferol 1000 units tablet  Commonly known as:  VITAMIN D  Take 1,000 Units by mouth daily. Reported on 08/01/2015     [employer] SPECTRAVITE ADULT 50+ PO  Take 1 tablet by mouth daily. Reported on 08/01/2015     docusate sodium 100 MG capsule  Commonly known as:  COLACE  Take 1 capsule  (100 mg total) by mouth 2 (two) times daily.     ferrous sulfate 325 (65 FE) MG tablet  Take 1 tablet (325 mg total) by mouth 3 (three) times daily after meals.     fish oil-omega-3 fatty acids 1000 MG capsule  Take 2 g by mouth daily. Reported on 08/01/2015     HYDROcodone-acetaminophen 7.5-325 MG tablet  Commonly known as:  NORCO  Take 1-2 tablets by mouth every 4 (four) hours as needed for  moderate pain.     nitroGLYCERIN 0.4 MG SL tablet  Commonly known as:  NITROSTAT  Place 1 tablet (0.4 mg total) under the tongue every 5 (five) minutes as needed.     omeprazole 40 MG capsule  Commonly known as:  PRILOSEC  Take 1 capsule (40 mg total) by mouth daily.     polyethylene glycol packet  Commonly known as:  MIRALAX / GLYCOLAX  Take 17 g by mouth 2 (two) times daily.     tiZANidine 4 MG tablet  Commonly known as:  ZANAFLEX  Take 1 tablet (4 mg total) by mouth every 6 (six) hours as needed for muscle spasms.     vitamin E 400 UNIT capsule  Take 400 Units by mouth daily. Reported on 08/01/2015         Signed: [place] Pugh. Kynlee Koenigsberg   PA-C  08/02/2015, 1:37 PM

## 2015-08-02 NOTE — Therapy (Signed)
[facility] [place], [place], [address] Phone: [phone]   Fax:  [phone]  Physical Therapy Treatment  Patient Details  Name: Shawn Lambert MRN: [id number] Date of Birth: 1940/08/27 Referring Provider: Paralee Cancel, MD  Encounter Date: 08/02/2015      PT End of Session - 08/02/15 1523    Visit Number 2   Number of Visits 12   Date for PT Re-Evaluation 09/12/15   PT Start Time [id number]   PT Stop Time 1611   PT Time Calculation (min) 50 min   Activity Tolerance Patient tolerated treatment well;Patient limited by pain   Behavior During Therapy [facility] for tasks assessed/performed      Past Medical History  Diagnosis Date  . Kidney stones   . Hypertension   . Hyperlipidemia   . GERD (gastroesophageal reflux disease)   . Anginal pain ([place])   . Dysrhythmia     pt. states physician said " he has a irregular heart beat"  . Arthritis   . Cancer ([place])     basil cell skin cancer    Past Surgical History  Procedure Laterality Date  . Total knee arthroplasty    . Eye surgery Right 1965    Prosthetic Eye  . Total knee arthroplasty Right 07/26/2015    Procedure: RIGHT TOTAL KNEE ARTHROPLASTY;  Surgeon: Paralee Cancel, MD;  Location: [facility];  Service: Orthopedics;  Laterality: Right;    There were no vitals filed for this visit.      Subjective Assessment - 08/02/15 1522    Subjective Reports that in the mornings he cannot put pressure through RLE for around 15 minutes.    Pertinent History HTN. Lt TKA 2008, blind in R eye   Patient Stated Goals Decrease pain , increase movement   Currently in Pain? Yes   Pain Score 7    Pain Location Knee   Pain Orientation Right   Pain Descriptors / Indicators Tightness;Sore   Pain Type Surgical pain   Pain Onset In the past 7 days            [facility] PT Assessment - 08/02/15 0001    Assessment   Medical Diagnosis s/p R TKA   Onset Date/Surgical Date 07/26/15   Next MD Visit  08/08/2015   Precautions   Precaution Comments Blind in R eye                     [facility] Adult PT Treatment/Exercise - 08/02/15 0001    Exercises   Exercises Knee/Hip   Knee/Hip Exercises: Stretches   Active Hamstring Stretch Right;3 reps;30 seconds   Knee/Hip Exercises: Aerobic   Nustep L3, seat 12 x12 min   Knee/Hip Exercises: Standing   Rocker Board 3 minutes   Knee/Hip Exercises: Supine   Short Arc Quad Sets AROM;Right;2 sets;10 reps   Straight Leg Raises AROM;Right;1 set;10 reps   Modalities   Modalities Electrical Stimulation;Cryotherapy   Cryotherapy   Number Minutes Cryotherapy 15 Minutes   Cryotherapy Location Knee   Type of Cryotherapy Ice pack   Electrical Stimulation   Electrical Stimulation Location R knee   Electrical Stimulation Action IFC   Electrical Stimulation Parameters 1-10 Hz x15 min   Electrical Stimulation Goals Edema;Pain                PT Education - 08/01/15 1428    Education provided Yes   Education Details continue with HEP   Person(s) Educated Patient;Spouse  Methods Explanation   Comprehension Verbalized understanding;Returned demonstration             PT Long Term Goals - 08/28/2015 1533    PT LONG TERM GOAL #1   Title I with HEP   Time 4   Period Weeks   Status New   PT LONG TERM GOAL #2   Title Improved R knee ROM 0-120 degrees to improve function6   Time 6   Period Weeks   Status New   PT LONG TERM GOAL #3   Title Patient able to amb 500 feet safely without AD.   Time 6   Period Weeks   Status New   PT LONG TERM GOAL #4   Title demo 4+/5 RLE strength to improve function.   Time 6   Period Weeks   Status New   PT LONG TERM GOAL #5   Title decreased edema to within 2 cm or less of L knee   Time 6   Period Weeks   Status New               Plan - 08/02/15 1558    Clinical Impression Statement Patient tolerated today's treatment fairly well as he arrived to clinic with increased R knee  pain. Patient had visible inflammation around R knee down to R ankle. Pink coloration was noted along lateral border of surgical dressing along with purple/yellow coloration along lateral distal R thigh. Patient did well on NuStep with VCs to complete to his tolerance. Patient noted with rockerboard that he could not tolerate much more by the end of 3 minutes. Patient also noted discomfort with R standing HS stretch today. Patient tolerated R SAQ without resistance well with no pain reported. Patient noted discomfort, soreness and pain with R SLR as it was limited to 10 repittions. Patient reported soreness in R calf with ankle pumps upon squeezing to monior for blood clots following therapeutic exercises. Madelyn Flavors, MPT was advised of the issue and encouraged patient to contact MD office to let them know of the symptoms. Electrical stimulation and ice pack were utilized for pain and edema. Patient experienced 4-5/10 R knee pain upon end of treatment and was reminded to contact MD office of symptoms.   Rehab Potential Excellent   PT Frequency 3x / week   PT Duration 6 weeks   PT Treatment/Interventions ADLs/Self Care Home Management;Cryotherapy;Occupational psychologist;Therapeutic exercise;Vasopneumatic Device;Manual techniques;Patient/family education;Passive range of motion;Scar mobilization;Neuromuscular re-education;Balance training   PT Next Visit Plan Continue per R TKR protocol with modalities PRN for pain and edema. Patient to contact MD office regarding inflammation and soreness in calf.   PT Home Exercise Plan continue with QS, HS, SLR, heel slides, elevation/ice   Consulted and Agree with Plan of Care Patient      Patient will benefit from skilled therapeutic intervention in order to improve the following deficits and impairments:  Abnormal gait, Decreased range of motion, Pain, Increased edema, Decreased strength  Visit Diagnosis: Stiffness of right knee, not  elsewhere classified  Pain in right knee  Generalized edema       G-Codes - August 28, 2015 1527    Functional Assessment Tool Used FOTO   Functional Limitation Mobility: Walking and moving around   Mobility: Walking and Moving Around Current Status [phone]) At least 60 percent but less than 80 percent impaired, limited or restricted   Mobility: Walking and Moving Around Goal Status [id number]) At least 40 percent but less than 60 percent impaired, limited  or restricted      Problem List Patient Active Problem List   Diagnosis Date Noted  . S/P right TKA 07/26/2015  . S/P knee replacement 07/26/2015  . HYPERTENSION, HEART CONTROLLED W/O ASSOC CHF 07/12/2009  . Hyperlipidemia LDL goal <130 06/15/2009  . GERD 06/15/2009  . Osteoarthritis of both knees 06/15/2009  . ABNORMAL STRESS ELECTROCARDIOGRAM 06/15/2009    Wynelle Fanny, PTA 08/02/2015, 4:27 PM  [facility] [place], [place], [address] Phone: [phone]   Fax:  [phone]  Name: Shawn Lambert MRN: [id number] Date of Birth: 02-16-1941

## 2015-08-04 ENCOUNTER — Ambulatory Visit: Payer: Medicare Other | Admitting: *Deleted

## 2015-08-04 DIAGNOSIS — M25661 Stiffness of right knee, not elsewhere classified: Secondary | ICD-10-CM

## 2015-08-04 DIAGNOSIS — R601 Generalized edema: Secondary | ICD-10-CM | POA: Diagnosis not present

## 2015-08-04 DIAGNOSIS — M25561 Pain in right knee: Secondary | ICD-10-CM | POA: Diagnosis not present

## 2015-08-04 NOTE — Therapy (Signed)
[facility] [place], [place], [address] Phone: [phone]   Fax:  [phone]  Physical Therapy Treatment  Patient Details  Name: Shawn Lambert MRN: [id number] Date of Birth: 1941/02/28 Referring Provider: Paralee Cancel, MD  Encounter Date: 08/04/2015      PT End of Session - 08/04/15 1537    Visit Number 3   Number of Visits 12   Date for PT Re-Evaluation 09/12/15   PT Start Time [id number]   PT Stop Time 1609   PT Time Calculation (min) 54 min      Past Medical History  Diagnosis Date  . Kidney stones   . Hypertension   . Hyperlipidemia   . GERD (gastroesophageal reflux disease)   . Anginal pain ([place])   . Dysrhythmia     pt. states physician said " he has a irregular heart beat"  . Arthritis   . Cancer ([place])     basil cell skin cancer    Past Surgical History  Procedure Laterality Date  . Total knee arthroplasty    . Eye surgery Right 1965    Prosthetic Eye  . Total knee arthroplasty Right 07/26/2015    Procedure: RIGHT TOTAL KNEE ARTHROPLASTY;  Surgeon: Paralee Cancel, MD;  Location: [facility];  Service: Orthopedics;  Laterality: Right;    There were no vitals filed for this visit.      Subjective Assessment - 08/04/15 1535    Subjective Reports that in the mornings he cannot put pressure through RLE for around 15 minutes. Called MD yesterday and he said not to worry about the swelling.   Pertinent History HTN. Lt TKA 2008, blind in R eye             RT knee 07-26-15   Patient Stated Goals Decrease pain , increase movement   Currently in Pain? Yes   Pain Score 7    Pain Location Knee   Pain Orientation Right   Pain Descriptors / Indicators Tightness;Sore   Pain Type Surgical pain   Pain Onset 1 to 4 weeks ago   Pain Frequency Constant                         [facility] Adult PT Treatment/Exercise - 08/04/15 0001    Exercises   Exercises Knee/Hip   Knee/Hip Exercises: Stretches   Active  Hamstring Stretch Right;3 reps;30 seconds   Knee/Hip Exercises: Aerobic   Nustep L3, seat 12, 11, 10  x 91min for ROM progression   Knee/Hip Exercises: Standing   Hip Flexion AROM;Both;3 sets;10 reps  marching in // bars   Rocker Board 5 minutes   Knee/Hip Exercises: Seated   Long Arc Quad AROM;Right;3 sets;10 reps   Knee/Hip Exercises: Supine   Short Arc Quad Sets AROM;Right;2 sets;10 reps   Straight Leg Raises AROM;Right;1 set;10 reps   Modalities   Modalities Electrical Stimulation;Cryotherapy;Vasopneumatic   Cryotherapy   Number Minutes Cryotherapy --   Cryotherapy Location --   Acupuncturist Location R knee   Electrical Stimulation Goals Edema;Pain   Vasopneumatic   Number Minutes Vasopneumatic  15 minutes   Vasopnuematic Location  Knee   Vasopneumatic Pressure Low   Vasopneumatic Temperature  34                     PT Long Term Goals - 08/01/15 1533    PT LONG TERM GOAL #1   Title I  with HEP   Time 4   Period Weeks   Status New   PT LONG TERM GOAL #2   Title Improved R knee ROM 0-120 degrees to improve function6   Time 6   Period Weeks   Status New   PT LONG TERM GOAL #3   Title Patient able to amb 500 feet safely without AD.   Time 6   Period Weeks   Status New   PT LONG TERM GOAL #4   Title demo 4+/5 RLE strength to improve function.   Time 6   Period Weeks   Status New   PT LONG TERM GOAL #5   Title decreased edema to within 2 cm or less of L knee   Time 6   Period Weeks   Status New               Plan - 08/04/15 1540    Clinical Impression Statement Pt did great today with Rx for RT knee. He had talked to his MD office and they told him his symptoms were normal and not to worry about the swelling. He was able to tolerate therex easier today and had less soreness in calf  as well as decreased knee swelling. Flexion ROM looked good, but had a mild extension lag. Pt tolerated  estim and vaso today  with normal response.    Rehab Potential Excellent   PT Frequency 3x / week   PT Duration 6 weeks   PT Treatment/Interventions ADLs/Self Care Home Management;Cryotherapy;Occupational psychologist;Therapeutic exercise;Vasopneumatic Device;Manual techniques;Patient/family education;Passive range of motion;Scar mobilization;Neuromuscular re-education;Balance training   PT Next Visit Plan Continue per R TKR protocol with modalities PRN for pain and edema. Patient to contact MD office regarding inflammation and soreness in calf.   PT Home Exercise Plan continue with QS, HS, SLR, heel slides, elevation/ice   Consulted and Agree with Plan of Care Patient      Patient will benefit from skilled therapeutic intervention in order to improve the following deficits and impairments:  Abnormal gait, Decreased range of motion, Pain, Increased edema, Decreased strength  Visit Diagnosis: Stiffness of right knee, not elsewhere classified  Pain in right knee     Problem List Patient Active Problem List   Diagnosis Date Noted  . S/P right TKA 07/26/2015  . S/P knee replacement 07/26/2015  . HYPERTENSION, HEART CONTROLLED W/O ASSOC CHF 07/12/2009  . Hyperlipidemia LDL goal <130 06/15/2009  . GERD 06/15/2009  . Osteoarthritis of both knees 06/15/2009  . ABNORMAL STRESS ELECTROCARDIOGRAM 06/15/2009    RAMSEUR,CHRIS 08/04/2015, 6:22 PM  [facility] [place], [place], [address] Phone: [phone]   Fax:  [phone]  Name: Shawn Lambert MRN: [id number] Date of Birth: 1940/12/10

## 2015-08-04 NOTE — Therapy (Signed)
[facility] [place], [place], [address] Phone: [phone]   Fax:  [phone]  Physical Therapy Treatment  Patient Details  Name: Shawn Lambert MRN: [id number] Date of Birth: 04-15-1940 Referring Provider: Paralee Cancel, MD  Encounter Date: 08/04/2015      PT End of Session - 08/04/15 1537    Visit Number 3   Number of Visits 12   Date for PT Re-Evaluation 09/12/15   PT Start Time 28      Past Medical History  Diagnosis Date  . Kidney stones   . Hypertension   . Hyperlipidemia   . GERD (gastroesophageal reflux disease)   . Anginal pain ([place])   . Dysrhythmia     pt. states physician said " he has a irregular heart beat"  . Arthritis   . Cancer ([place])     basil cell skin cancer    Past Surgical History  Procedure Laterality Date  . Total knee arthroplasty    . Eye surgery Right 1965    Prosthetic Eye  . Total knee arthroplasty Right 07/26/2015    Procedure: RIGHT TOTAL KNEE ARTHROPLASTY;  Surgeon: Paralee Cancel, MD;  Location: [facility];  Service: Orthopedics;  Laterality: Right;    There were no vitals filed for this visit.      Subjective Assessment - 08/04/15 1535    Subjective Reports that in the mornings he cannot put pressure through RLE for around 15 minutes. Called MD yesterday and he said not to worry about the swelling.   Pertinent History HTN. Lt TKA 2008, blind in R eye             RT knee 07-26-15   Patient Stated Goals Decrease pain , increase movement   Currently in Pain? Yes   Pain Score 7    Pain Location Knee   Pain Orientation Right   Pain Descriptors / Indicators Tightness;Sore   Pain Type Surgical pain   Pain Onset 1 to 4 weeks ago   Pain Frequency Constant                         [facility] Adult PT Treatment/Exercise - 08/04/15 0001    Exercises   Exercises Knee/Hip   Knee/Hip Exercises: Stretches   Active Hamstring Stretch Right;3 reps;30 seconds   Knee/Hip  Exercises: Aerobic   Nustep L3, seat 12, 11, 10  x 56min for ROM progression   Knee/Hip Exercises: Standing   Hip Flexion AROM;Both;3 sets;10 reps  marching in // bars   Rocker Board 5 minutes   Knee/Hip Exercises: Seated   Long Arc Quad AROM;Right;3 sets;10 reps   Knee/Hip Exercises: Supine   Short Arc Quad Sets AROM;Right;2 sets;10 reps   Straight Leg Raises AROM;Right;1 set;10 reps   Modalities   Modalities Electrical Stimulation;Cryotherapy;Vasopneumatic   Cryotherapy   Number Minutes Cryotherapy --   Cryotherapy Location --   Acupuncturist Location R knee   Electrical Stimulation Goals Edema;Pain   Vasopneumatic   Number Minutes Vasopneumatic  15 minutes   Vasopnuematic Location  Knee   Vasopneumatic Pressure Low   Vasopneumatic Temperature  34                     PT Long Term Goals - 08/01/15 1533    PT LONG TERM GOAL #1   Title I with HEP   Time 4   Period Weeks   Status New  PT LONG TERM GOAL #2   Title Improved R knee ROM 0-120 degrees to improve function6   Time 6   Period Weeks   Status New   PT LONG TERM GOAL #3   Title Patient able to amb 500 feet safely without AD.   Time 6   Period Weeks   Status New   PT LONG TERM GOAL #4   Title demo 4+/5 RLE strength to improve function.   Time 6   Period Weeks   Status New   PT LONG TERM GOAL #5   Title decreased edema to within 2 cm or less of L knee   Time 6   Period Weeks   Status New               Plan - 08/04/15 1540    Clinical Impression Statement Pt did great today with Rx for RT knee. He had talked to his MD office and they told him his symptoms were normal and not to worry about the swelling. He was able to tolerate therex easier today and had less soreness in calf  as well as decreased knee swelling. Flexion ROM looked good, but had a mild extension lag. Pt tolerated  estim and vaso today with normal response.    Rehab Potential Excellent    PT Frequency 3x / week   PT Duration 6 weeks   PT Treatment/Interventions ADLs/Self Care Home Management;Cryotherapy;Occupational psychologist;Therapeutic exercise;Vasopneumatic Device;Manual techniques;Patient/family education;Passive range of motion;Scar mobilization;Neuromuscular re-education;Balance training   PT Next Visit Plan Continue per R TKR protocol with modalities PRN for pain and edema. Patient to contact MD office regarding inflammation and soreness in calf.   PT Home Exercise Plan continue with QS, HS, SLR, heel slides, elevation/ice   Consulted and Agree with Plan of Care Patient      Patient will benefit from skilled therapeutic intervention in order to improve the following deficits and impairments:  Abnormal gait, Decreased range of motion, Pain, Increased edema, Decreased strength  Visit Diagnosis: Stiffness of right knee, not elsewhere classified  Pain in right knee     Problem List Patient Active Problem List   Diagnosis Date Noted  . S/P right TKA 07/26/2015  . S/P knee replacement 07/26/2015  . HYPERTENSION, HEART CONTROLLED W/O ASSOC CHF 07/12/2009  . Hyperlipidemia LDL goal <130 06/15/2009  . GERD 06/15/2009  . Osteoarthritis of both knees 06/15/2009  . ABNORMAL STRESS ELECTROCARDIOGRAM 06/15/2009    RAMSEUR,CHRIS, PTA 08/04/2015, 6:20 PM  [facility] [place], [place], [address] Phone: [phone]   Fax:  [phone]  Name: Shawn Lambert MRN: [id number] Date of Birth: [birth year]

## 2015-08-08 ENCOUNTER — Other Ambulatory Visit: Payer: Self-pay | Admitting: Family Medicine

## 2015-08-08 ENCOUNTER — Encounter: Payer: Self-pay | Admitting: Physical Therapy

## 2015-08-09 ENCOUNTER — Encounter: Payer: Self-pay | Admitting: Physical Therapy

## 2015-08-09 ENCOUNTER — Ambulatory Visit: Payer: Medicare Other | Admitting: Physical Therapy

## 2015-08-09 DIAGNOSIS — R601 Generalized edema: Secondary | ICD-10-CM

## 2015-08-09 DIAGNOSIS — M25561 Pain in right knee: Secondary | ICD-10-CM | POA: Diagnosis not present

## 2015-08-09 DIAGNOSIS — M25661 Stiffness of right knee, not elsewhere classified: Secondary | ICD-10-CM | POA: Diagnosis not present

## 2015-08-09 NOTE — Therapy (Signed)
[facility] [place], [place], [address] Phone: [phone]   Fax:  [phone]  Physical Therapy Treatment  Patient Details  Name: Shawn Lambert MRN: [id number] Date of Birth: December 04, 1940 Referring Provider: Paralee Cancel, MD  Encounter Date: 08/09/2015      PT End of Session - 08/09/15 1508    Visit Number 4   Number of Visits 12   Date for PT Re-Evaluation 09/12/15   PT Start Time [id number]   PT Stop Time 1610   PT Time Calculation (min) 54 min   Activity Tolerance Patient tolerated treatment well   Behavior During Therapy [facility] for tasks assessed/performed      Past Medical History  Diagnosis Date  . Kidney stones   . Hypertension   . Hyperlipidemia   . GERD (gastroesophageal reflux disease)   . Anginal pain ([place])   . Dysrhythmia     pt. states physician said " he has a irregular heart beat"  . Arthritis   . Cancer ([place])     basil cell skin cancer    Past Surgical History  Procedure Laterality Date  . Total knee arthroplasty    . Eye surgery Right 1965    Prosthetic Eye  . Total knee arthroplasty Right 07/26/2015    Procedure: RIGHT TOTAL KNEE ARTHROPLASTY;  Surgeon: Paralee Cancel, MD;  Location: [facility];  Service: Orthopedics;  Laterality: Right;    There were no vitals filed for this visit.      Subjective Assessment - 08/09/15 1508    Subjective Reports that he isn't swelling as much and the bruising is almost gone. Patient notes that he has slept much better since Sunday night when he didn't sleep well.    Pertinent History HTN. Lt TKA 2008, blind in R eye             RT knee 07-26-15   Patient Stated Goals Decrease pain , increase movement   Currently in Pain? Yes   Pain Score 4    Pain Location Knee   Pain Orientation Right   Pain Type Surgical pain   Pain Onset 1 to 4 weeks ago            [facility] PT Assessment - 08/09/15 0001    Assessment   Medical Diagnosis s/p R TKA   Onset Date/Surgical  Date 07/26/15   Next MD Visit 08/10/2015   Precautions   Precaution Comments Blind in R eye   Observation/Other Assessments-Edema    Edema Circumferential   Circumferential Edema   Circumferential - Right 44.4   Circumferential - Left  39.9   ROM / Strength   AROM / PROM / Strength AROM;PROM   AROM   Overall AROM  Deficits   AROM Assessment Site Knee   Right/Left Knee Right   Right Knee Extension -10   Right Knee Flexion 100   PROM   Overall PROM  Deficits   PROM Assessment Site Knee   Right/Left Knee Right   Right Knee Extension -7   Right Knee Flexion 105                     [facility] Adult PT Treatment/Exercise - 08/09/15 0001    Knee/Hip Exercises: Stretches   Active Hamstring Stretch Right;3 reps;20 seconds   Knee/Hip Exercises: Aerobic   Nustep L5, seat 12-11 x12 min for ROM   Knee/Hip Exercises: Standing   Hip Flexion AROM;Right;2 sets;10 reps;Knee bent   Forward Lunges  Right;2 sets;10 reps;2 seconds;Other (comment)  6" step   Rocker Board 3 minutes   Knee/Hip Exercises: Seated   Long Arc Quad Strengthening;Right;3 sets;10 reps;Weights   Long Arc Quad Weight 3 lbs.   Knee/Hip Exercises: Supine   Straight Leg Raises AROM;Right;3 sets;10 reps   Modalities   Modalities Science writer Action IFC   Electrical Stimulation Parameters 1-10 Hz x15 min   Electrical Stimulation Goals Edema;Pain   Vasopneumatic   Number Minutes Vasopneumatic  15 minutes   Vasopnuematic Location  Knee   Vasopneumatic Pressure Medium   Vasopneumatic Temperature  34   Manual Therapy   Manual Therapy Passive ROM;Soft tissue mobilization   Soft tissue mobilization R patellar mobilizations in sup/inf, L/R to promote proper mobility; STW to R medial HS to decrease tightness noted upon palpation   Passive ROM PROM of R knee into flex/ext with gentle holds at end range                      PT Long Term Goals - 08/01/15 1533    PT LONG TERM GOAL #1   Title I with HEP   Time 4   Period Weeks   Status New   PT LONG TERM GOAL #2   Title Improved R knee ROM 0-120 degrees to improve function6   Time 6   Period Weeks   Status New   PT LONG TERM GOAL #3   Title Patient able to amb 500 feet safely without AD.   Time 6   Period Weeks   Status New   PT LONG TERM GOAL #4   Title demo 4+/5 RLE strength to improve function.   Time 6   Period Weeks   Status New   PT LONG TERM GOAL #5   Title decreased edema to within 2 cm or less of L knee   Time 6   Period Weeks   Status New               Plan - 08/09/15 1509    Clinical Impression Statement Patient tolerated today's treatment well with discomfort noted with R HS stretch in standing. Patient continues to tolerate exercises well without any other reports of pain or discomfort. Patient continues to demonstrate a minimal R extensor lag. 4.5 cm difference in circumferential edema with R knee >L knee. AROM of R knee measured as 10-100 deg and PROM of R knee measured as 7-105 deg. Demonstrated good R patellar mobility in L/R and sup/inf directions with no reports of discomfort with mobilizations. Patient presented with a pocket of inflammation in superiolateral aspect of R knee that was not tender to palpation. Normal modalites response noted following removal of the modalites. Patient is ambulating without assistive device at this time with a  slight antalgic gait and decreased R knee flexion.   Rehab Potential Excellent   PT Frequency 3x / week   PT Duration 6 weeks   PT Treatment/Interventions ADLs/Self Care Home Management;Cryotherapy;Occupational psychologist;Therapeutic exercise;Vasopneumatic Device;Manual techniques;Patient/family education;Passive range of motion;Scar mobilization;Neuromuscular re-education;Balance training   PT Next Visit Plan Continue per R TKR  protocol with modalities PRN for pain and edema.    PT Home Exercise Plan continue with QS, HS, SLR, heel slides, elevation/ice   Consulted and Agree with Plan of Care Patient      Patient will benefit from skilled therapeutic intervention in order to  improve the following deficits and impairments:  Abnormal gait, Decreased range of motion, Pain, Increased edema, Decreased strength  Visit Diagnosis: Stiffness of right knee, not elsewhere classified  Pain in right knee  Generalized edema     Problem List Patient Active Problem List   Diagnosis Date Noted  . S/P right TKA 07/26/2015  . S/P knee replacement 07/26/2015  . HYPERTENSION, HEART CONTROLLED W/O ASSOC CHF 07/12/2009  . Hyperlipidemia LDL goal <130 06/15/2009  . GERD 06/15/2009  . Osteoarthritis of both knees 06/15/2009  . ABNORMAL STRESS ELECTROCARDIOGRAM 06/15/2009    Ahmed Prima, PTA 08/09/2015 5:02 PM Mali Applegate MPT [facility] [address] [place], [place], [address] Phone: [phone]   Fax:  [phone]  Name: Shawn Lambert MRN: [id number] Date of Birth: October 01, 1940

## 2015-08-10 DIAGNOSIS — Z96651 Presence of right artificial knee joint: Secondary | ICD-10-CM | POA: Diagnosis not present

## 2015-08-10 DIAGNOSIS — Z471 Aftercare following joint replacement surgery: Secondary | ICD-10-CM | POA: Diagnosis not present

## 2015-08-11 ENCOUNTER — Ambulatory Visit: Payer: Medicare Other | Admitting: Physical Therapy

## 2015-08-11 DIAGNOSIS — R601 Generalized edema: Secondary | ICD-10-CM

## 2015-08-11 DIAGNOSIS — M25661 Stiffness of right knee, not elsewhere classified: Secondary | ICD-10-CM

## 2015-08-11 DIAGNOSIS — M25561 Pain in right knee: Secondary | ICD-10-CM | POA: Diagnosis not present

## 2015-08-11 NOTE — Therapy (Signed)
[facility] [place], [place], [address] Phone: [phone]   Fax:  [phone]  Physical Therapy Treatment  Patient Details  Name: Shawn Lambert MRN: [id number] Date of Birth: [birth year] Referring Provider: Paralee Cancel, MD  Encounter Date: 08/11/2015      PT End of Session - 08/11/15 1639    Visit Number 5   Number of Visits 12   Date for PT Re-Evaluation 09/12/15   PT Start Time 0315   PT Stop Time 0423   PT Time Calculation (min) 68 min   Activity Tolerance Patient tolerated treatment well   Behavior During Therapy [facility] for tasks assessed/performed      Past Medical History  Diagnosis Date  . Kidney stones   . Hypertension   . Hyperlipidemia   . GERD (gastroesophageal reflux disease)   . Anginal pain ([place])   . Dysrhythmia     pt. states physician said " he has a irregular heart beat"  . Arthritis   . Cancer ([place])     basil cell skin cancer    Past Surgical History  Procedure Laterality Date  . Total knee arthroplasty    . Eye surgery Right 1965    Prosthetic Eye  . Total knee arthroplasty Right 07/26/2015    Procedure: RIGHT TOTAL KNEE ARTHROPLASTY;  Surgeon: Paralee Cancel, MD;  Location: [facility];  Service: Orthopedics;  Laterality: Right;    There were no vitals filed for this visit.      Subjective Assessment - 08/11/15 1648    Subjective The Dr. was very pleased.  He is cutting me down to 2 times a week.  I am not using anything to walk with.   Pertinent History HTN. Lt TKA 2008, blind in R eye             RT knee 07-26-15   Patient Stated Goals Decrease pain , increase movement   Pain Score 3    Pain Location Knee   Pain Orientation Right   Pain Descriptors / Indicators Tightness;Sore   Pain Type Surgical pain   Pain Onset 1 to 4 weeks ago   Pain Frequency Constant                         [facility] Adult PT Treatment/Exercise - 08/11/15 0001    Exercises   Exercises Knee/Hip   Knee/Hip Exercises: Aerobic   Nustep Level 4 x 16 minutes moving forward x 2 to increase flexion.   Knee/Hip Exercises: Supine   Short Arc Quad Sets Limitations SAQ's facilitated with VMS to right quadriceps (10 sec on and 10 sec off) x 22 minutes.   Acupuncturist Location --  Right knee.   Electrical Stimulation Action IFC   Electrical Stimulation Parameters 1-10 Hz x 15 minutes.   Vasopneumatic   Number Minutes Vasopneumatic  15 minutes   Vasopnuematic Location  Knee   Vasopneumatic Pressure Medium   Vasopneumatic Temperature  34   Manual Therapy   Manual Therapy Passive ROM   Passive ROM Right knee flexion stretching x 3 minutes.                     PT Long Term Goals - 08/01/15 1533    PT LONG TERM GOAL #1   Title I with HEP   Time 4   Period Weeks   Status New   PT LONG TERM GOAL #2  Title Improved R knee ROM 0-120 degrees to improve function6   Time 6   Period Weeks   Status New   PT LONG TERM GOAL #3   Title Patient able to amb 500 feet safely without AD.   Time 6   Period Weeks   Status New   PT LONG TERM GOAL #4   Title demo 4+/5 RLE strength to improve function.   Time 6   Period Weeks   Status New   PT LONG TERM GOAL #5   Title decreased edema to within 2 cm or less of L knee   Time 6   Period Weeks   Status New             Patient will benefit from skilled therapeutic intervention in order to improve the following deficits and impairments:     Visit Diagnosis: Stiffness of right knee, not elsewhere classified  Pain in right knee  Generalized edema     Problem List Patient Active Problem List   Diagnosis Date Noted  . S/P right TKA 07/26/2015  . S/P knee replacement 07/26/2015  . HYPERTENSION, HEART CONTROLLED W/O ASSOC CHF 07/12/2009  . Hyperlipidemia LDL goal <130 06/15/2009  . GERD 06/15/2009  . Osteoarthritis of both knees 06/15/2009  . ABNORMAL STRESS ELECTROCARDIOGRAM  06/15/2009    Debby Clyne, [place] MPT 08/11/2015, 5:00 PM  [facility] [place], [place], [address] Phone: [phone]   Fax:  [phone]  Name: RASOOL PENNEL MRN: [id number] Date of Birth: 03/02/1941

## 2015-08-16 ENCOUNTER — Ambulatory Visit: Payer: Medicare Other | Admitting: Physical Therapy

## 2015-08-16 DIAGNOSIS — M25661 Stiffness of right knee, not elsewhere classified: Secondary | ICD-10-CM | POA: Diagnosis not present

## 2015-08-16 DIAGNOSIS — M25561 Pain in right knee: Secondary | ICD-10-CM | POA: Diagnosis not present

## 2015-08-16 DIAGNOSIS — R601 Generalized edema: Secondary | ICD-10-CM | POA: Diagnosis not present

## 2015-08-16 NOTE — Therapy (Signed)
[facility] [place], [place], [address] Phone: [phone]   Fax:  [phone]  Physical Therapy Treatment  Patient Details  Name: Shawn Lambert MRN: [id number] Date of Birth: [birth year] Referring Provider: Paralee Cancel, MD  Encounter Date: 08/16/2015      PT End of Session - 08/16/15 1544    Visit Number 6   Number of Visits 12   Date for PT Re-Evaluation 09/12/15   PT Start Time 0316   PT Stop Time 0420   PT Time Calculation (min) 64 min   Activity Tolerance Patient tolerated treatment well   Behavior During Therapy [facility] for tasks assessed/performed      Past Medical History  Diagnosis Date  . Kidney stones   . Hypertension   . Hyperlipidemia   . GERD (gastroesophageal reflux disease)   . Anginal pain ([place])   . Dysrhythmia     pt. states physician said " he has a irregular heart beat"  . Arthritis   . Cancer ([place])     basil cell skin cancer    Past Surgical History  Procedure Laterality Date  . Total knee arthroplasty    . Eye surgery Right 1965    Prosthetic Eye  . Total knee arthroplasty Right 07/26/2015    Procedure: RIGHT TOTAL KNEE ARTHROPLASTY;  Surgeon: Paralee Cancel, MD;  Location: [facility];  Service: Orthopedics;  Laterality: Right;    There were no vitals filed for this visit.      Subjective Assessment - 08/16/15 1545    Subjective I'm walking about a mile a day.   Pertinent History HTN. Lt TKA 2008, blind in R eye             RT knee 07-26-15   Patient Stated Goals Decrease pain , increase movement   Pain Score 2    Pain Location Knee   Pain Orientation Right   Pain Descriptors / Indicators Tightness;Sore   Pain Type Surgical pain   Pain Onset 1 to 4 weeks ago                         [facility] Adult PT Treatment/Exercise - 08/16/15 0001    Exercises   Exercises Knee/Hip;Ankle   Knee/Hip Exercises: Aerobic   Nustep Level 4 x 17 minutes.   Knee/Hip Exercises: Standing    Rocker Board --   Knee/Hip Exercises: Supine   Short Arc Quad Sets Limitations SAQ's x 10 minutes facilitated with VMS (10 sec extension holds and 10 sec rest).   Acupuncturist Location --  RT knee.   Electrical Stimulation Action IFC x 15 minutes.   Electrical Stimulation Goals Edema;Pain   Vasopneumatic   Number Minutes Vasopneumatic  15 minutes   Vasopnuematic Location  --  RT knee.   Vasopneumatic Pressure Medium   Manual Therapy   Manual Therapy Passive ROM   Passive ROM Right knee flexion and extension stretching x 7 minutes.   Ankle Exercises: Standing   Rocker Board Limitations 4 minutes.                     PT Long Term Goals - 08/01/15 1533    PT LONG TERM GOAL #1   Title I with HEP   Time 4   Period Weeks   Status New   PT LONG TERM GOAL #2   Title Improved R knee ROM 0-120 degrees to improve function6  Time 6   Period Weeks   Status New   PT LONG TERM GOAL #3   Title Patient able to amb 500 feet safely without AD.   Time 6   Period Weeks   Status New   PT LONG TERM GOAL #4   Title demo 4+/5 RLE strength to improve function.   Time 6   Period Weeks   Status New   PT LONG TERM GOAL #5   Title decreased edema to within 2 cm or less of L knee   Time 6   Period Weeks   Status New             Patient will benefit from skilled therapeutic intervention in order to improve the following deficits and impairments:     Visit Diagnosis: Stiffness of right knee, not elsewhere classified  Pain in right knee  Generalized edema     Problem List Patient Active Problem List   Diagnosis Date Noted  . S/P right TKA 07/26/2015  . S/P knee replacement 07/26/2015  . HYPERTENSION, HEART CONTROLLED W/O ASSOC CHF 07/12/2009  . Hyperlipidemia LDL goal <130 06/15/2009  . GERD 06/15/2009  . Osteoarthritis of both knees 06/15/2009  . ABNORMAL STRESS ELECTROCARDIOGRAM 06/15/2009    Linton Stolp, [place]  MPT 08/16/2015, 4:45 PM  [facility] [address] [place], [place], [address] Phone: [phone]   Fax:  [phone]  Name: GANESH NEALS MRN: [id number] Date of Birth: 03/26/1940

## 2015-08-18 ENCOUNTER — Encounter: Payer: Self-pay | Admitting: *Deleted

## 2015-08-22 ENCOUNTER — Ambulatory Visit: Payer: Medicare Other | Attending: Orthopedic Surgery | Admitting: *Deleted

## 2015-08-22 DIAGNOSIS — R601 Generalized edema: Secondary | ICD-10-CM

## 2015-08-22 DIAGNOSIS — M25561 Pain in right knee: Secondary | ICD-10-CM | POA: Diagnosis not present

## 2015-08-22 DIAGNOSIS — M25661 Stiffness of right knee, not elsewhere classified: Secondary | ICD-10-CM | POA: Diagnosis not present

## 2015-08-22 NOTE — Therapy (Signed)
[facility] [place], [place], [address] Phone: [phone]   Fax:  [phone]  Physical Therapy Treatment  Patient Details  Name: ARISTON MOERS MRN: [id number] Date of Birth: Jun 21, 1940 Referring Provider: Paralee Cancel, MD  Encounter Date: 08/22/2015      PT End of Session - 08/22/15 1424    Visit Number 7   Number of Visits 12   Date for PT Re-Evaluation 09/12/15   PT Start Time 1430   PT Stop Time 1530   PT Time Calculation (min) 60 min      Past Medical History  Diagnosis Date  . Kidney stones   . Hypertension   . Hyperlipidemia   . GERD (gastroesophageal reflux disease)   . Anginal pain ([place])   . Dysrhythmia     pt. states physician said " he has a irregular heart beat"  . Arthritis   . Cancer ([place])     basil cell skin cancer    Past Surgical History  Procedure Laterality Date  . Total knee arthroplasty    . Eye surgery Right 1965    Prosthetic Eye  . Total knee arthroplasty Right 07/26/2015    Procedure: RIGHT TOTAL KNEE ARTHROPLASTY;  Surgeon: Paralee Cancel, MD;  [facility];  Service: Orthopedics;  Laterality: Right;    There were no vitals filed for this visit.      Subjective Assessment - 08/22/15 1445    Subjective I'm walking about a mile a day.   Pertinent History HTN. Lt TKA 2008, blind in R eye             RT knee 07-26-15   Patient Stated Goals Decrease pain , increase movement   Currently in Pain? Yes   Pain Score 2    Pain Location Knee   Pain Orientation Right   Pain Descriptors / Indicators Tightness   Pain Type Surgical pain   Pain Onset 1 to 4 weeks ago   Pain Frequency Constant                         [facility] Adult PT Treatment/Exercise - 08/22/15 0001    Exercises   Exercises Knee/Hip;Ankle   Knee/Hip Exercises: Aerobic   Nustep Level 4 x 17 minutes. seat 11,10   Knee/Hip Exercises: Standing   Forward Step Up Right;3 sets;10 reps;Step Height: 6"   Rocker Board 5 minutes  Calf stretching and balance   SLS RT LE x 3 mins    Modalities   Modalities Electrical Stimulation;Vasopneumatic   Acupuncturist Location R knee   Electrical Stimulation Goals Edema;Pain   Vasopneumatic   Number Minutes Vasopneumatic  15 minutes   Vasopnuematic Location  Knee   Vasopneumatic Pressure Medium   Vasopneumatic Temperature  34   Manual Therapy   Manual Therapy Passive ROM   Passive ROM PROM of R knee into flex/ext with gentle holds at end range                     PT Long Term Goals - 08/01/15 1533    PT LONG TERM GOAL #1   Title I with HEP   Time 4   Period Weeks   Status New   PT LONG TERM GOAL #2   Title Improved R knee ROM 0-120 degrees to improve function6   Time 6   Period Weeks   Status New   PT LONG  TERM GOAL #3   Title Patient able to amb 500 feet safely without AD.   Time 6   Period Weeks   Status New   PT LONG TERM GOAL #4   Title demo 4+/5 RLE strength to improve function.   Time 6   Period Weeks   Status New   PT LONG TERM GOAL #5   Title decreased edema to within 2 cm or less of L knee   Time 6   Period Weeks   Status New               Plan - 08/22/15 1622    Clinical Impression Statement Pt did great today and was able to complete all ex.'s and act.'s for RT LE. He did well with stepups onto 6in step and only needed one UE assist. He was most challenged by SLS on RT LE and still has extension ROM deficits. Goals are ongoing    Rehab Potential Excellent   PT Frequency 3x / week   PT Duration 6 weeks   PT Treatment/Interventions ADLs/Self Care Home Management;Cryotherapy;Occupational psychologist;Therapeutic exercise;Vasopneumatic Device;Manual techniques;Patient/family education;Passive range of motion;Scar mobilization;Neuromuscular re-education;Balance training   PT Next Visit Plan Continue per R TKR protocol with modalities PRN for  pain and edema.    PT Home Exercise Plan continue with QS, HS, SLR, heel slides, elevation/ice   Consulted and Agree with Plan of Care Patient      Patient will benefit from skilled therapeutic intervention in order to improve the following deficits and impairments:  Abnormal gait, Decreased range of motion, Pain, Increased edema, Decreased strength  Visit Diagnosis: Stiffness of right knee, not elsewhere classified  Pain in right knee  Generalized edema     Problem List Patient Active Problem List   Diagnosis Date Noted  . S/P right TKA 07/26/2015  . S/P knee replacement 07/26/2015  . HYPERTENSION, HEART CONTROLLED W/O ASSOC CHF 07/12/2009  . Hyperlipidemia LDL goal <130 06/15/2009  . GERD 06/15/2009  . Osteoarthritis of both knees 06/15/2009  . ABNORMAL STRESS ELECTROCARDIOGRAM 06/15/2009    Mendell Bontempo,CHRIS, PTA 08/22/2015, 4:37 PM  [facility] [address] [place], [place], [address] Phone: [phone]   Fax:  [phone]  Name: ROODY THEBO MRN: [id number] Date of Birth: [birth year]

## 2015-08-25 ENCOUNTER — Encounter: Payer: Self-pay | Admitting: Physical Therapy

## 2015-08-25 ENCOUNTER — Ambulatory Visit: Payer: Medicare Other | Admitting: Physical Therapy

## 2015-08-25 DIAGNOSIS — M25561 Pain in right knee: Secondary | ICD-10-CM | POA: Diagnosis not present

## 2015-08-25 DIAGNOSIS — M25661 Stiffness of right knee, not elsewhere classified: Secondary | ICD-10-CM | POA: Diagnosis not present

## 2015-08-25 DIAGNOSIS — R601 Generalized edema: Secondary | ICD-10-CM | POA: Diagnosis not present

## 2015-08-25 NOTE — Therapy (Addendum)
[facility] [place], [place], [address] Phone: [phone]   Fax:  [phone]  Physical Therapy Treatment  Patient Details  Name: Shawn Lambert MRN: [id number] Date of Birth: Mar 26, 1940 Referring Provider: Paralee Cancel, MD  Encounter Date: 08/25/2015      PT End of Session - 08/25/15 1149    Visit Number 8   Number of Visits 12   Date for PT Re-Evaluation 09/12/15   PT Start Time 1115   PT Stop Time 1201   PT Time Calculation (min) 46 min   Activity Tolerance Patient tolerated treatment well   Behavior During Therapy [facility] for tasks assessed/performed      Past Medical History  Diagnosis Date  . Kidney stones   . Hypertension   . Hyperlipidemia   . GERD (gastroesophageal reflux disease)   . Anginal pain ([place])   . Dysrhythmia     pt. states physician said " he has a irregular heart beat"  . Arthritis   . Cancer ([place])     basil cell skin cancer    Past Surgical History  Procedure Laterality Date  . Total knee arthroplasty    . Eye surgery Right 1965    Prosthetic Eye  . Total knee arthroplasty Right 07/26/2015    Procedure: RIGHT TOTAL KNEE ARTHROPLASTY;  Surgeon: Paralee Cancel, MD;  Location: [facility];  Service: Orthopedics;  Laterality: Right;    There were no vitals filed for this visit.      Subjective Assessment - 08/25/15 1127    Subjective IPatient reported walking about a mile a day   Pertinent History HTN. Lt TKA 2008, blind in R eye             RT knee 07-26-15   Patient Stated Goals Decrease pain , increase movement   Currently in Pain? No/denies            [facility] PT Assessment - 08/25/15 0001    Observation/Other Assessments-Edema    Edema Circumferential   Circumferential Edema   Circumferential - Right 43.0   Circumferential - Left  40.5   AROM   Overall AROM  Deficits   AROM Assessment Site Knee   Right/Left Knee Right   Right Knee Extension -9   Right Knee Flexion 110   PROM   Overall PROM  Deficits;Within functional limits for tasks performed   PROM Assessment Site Knee   Right/Left Knee Right   Right Knee Extension -5   Right Knee Flexion 120                     [facility] Adult PT Treatment/Exercise - 08/25/15 0001    Knee/Hip Exercises: Aerobic   Nustep Level 4 x 15 minutes. seat 11,10                PT Education - 08/25/15 1156    Education provided Yes   Education Details HEP   Person(s) Educated Patient   Methods Explanation;Demonstration;Handout   Comprehension Verbalized understanding;Returned demonstration             PT Long Term Goals - 08/25/15 1143    PT LONG TERM GOAL #1   Title I with HEP   Time 4   Period Weeks   Status On-going   PT LONG TERM GOAL #2   Title Improved R knee ROM 0-120 degrees to improve function   Time 6   Period Weeks   Status On-going  AROM -9-110,  PROM -5-120 08/25/15   PT LONG TERM GOAL #3   Title Patient able to amb 500 feet safely without AD.   Time 6   Period Weeks   Status Achieved  patient has reported not using any assistive device and able to wal safetly, patient able to walk in therapy also safetly with no difficulty 500+ feet 08/25/15   PT LONG TERM GOAL #4   Title demo 4+/5 RLE strength to improve function.   Time 6   Period Weeks   Status On-going   PT LONG TERM GOAL #5   Title decreased edema to within 2 cm or less of L knee   Time 6   Period Weeks   Status On-going  2 1/2 cm 08/25/15               Plan - 08/25/15 1150    Clinical Impression Statement Patient progressing with all activities. Patient has reported no pain pre or post treatment and has improved ROM and decreased edema today. Patient is able to ambulate safetly without assist device.HEP given today for self ROM/stretching and strengthening . Patient met LTG#3 today others ongoing due to strength, edema and full ROM deficits.    Rehab Potential Excellent   PT Frequency 3x / week   PT Duration 6  weeks   PT Treatment/Interventions ADLs/Self Care Home Management;Cryotherapy;Occupational psychologist;Therapeutic exercise;Vasopneumatic Device;Manual techniques;Patient/family education;Passive range of motion;Scar mobilization;Neuromuscular re-education;Balance training   PT Next Visit Plan Continue per R TKR protocol with modalities PRN for pain and edema.    Consulted and Agree with Plan of Care Patient      Patient will benefit from skilled therapeutic intervention in order to improve the following deficits and impairments:  Abnormal gait, Decreased range of motion, Pain, Increased edema, Decreased strength  Visit Diagnosis: Stiffness of right knee, not elsewhere classified  Pain in right knee  Generalized edema     Problem List Patient Active Problem List   Diagnosis Date Noted  . S/P right TKA 07/26/2015  . S/P knee replacement 07/26/2015  . HYPERTENSION, HEART CONTROLLED W/O ASSOC CHF 07/12/2009  . Hyperlipidemia LDL goal <130 06/15/2009  . GERD 06/15/2009  . Osteoarthritis of both knees 06/15/2009  . ABNORMAL STRESS ELECTROCARDIOGRAM 06/15/2009    Phillips Climes, PTA 08/25/2015, 12:01 PM  [facility] [place], [place], [address] Phone: [phone]   Fax:  [phone]  Name: Shawn Lambert MRN: [id number] Date of Birth: 1941/03/18  PHYSICAL THERAPY DISCHARGE SUMMARY  Visits from Start of Care: 8.  Current functional level related to goals / functional outcomes: See above.   Remaining deficits: Continued loss of right knee ROM.   Education / Equipment: HEP.  Plan: Patient agrees to discharge.  Patient goals were not met. Patient is being discharged due to not returning since the last visit.  ?????         Mali Applegate MPT

## 2015-08-25 NOTE — Patient Instructions (Signed)
    Knee Flexion Stretch on Step  Place foot on step and lean forward until you feel a good stretch in front of knee.   hold 30 sec x 5-10 perform 2-4 x daily  Knee Extension Mobilization: Towel Prop   With rolled towel under right ankle, place _1-5___ pound weight across knee. Hold __5+__ minutes. Repeat __2-3__ times per set. Do __2__ sets per session. Do __2-4__ sessions per day.  Knee ext stretch    With hands above right kneecap, gently push knee down until you feel a stretch. Hold __10__ seconds. Repeat _10___ times per set. Do __5+__ sets per session. Do __2-4__ sessions per day.   Stretching: Hamstring (Standing)    Place right foot on small stool. Slowly lean forward, keeping back straight, until stretch is felt in back of thigh. Hold __30__ seconds. Repeat __3__ times per set. Do __1-2__ sets per session. Do _2-4___ sessions per day.   Strengthening: Hip Abduction (Side-Lying)  Strengthening: Straight Leg Raise (Phase 1)  Repeat _10___ times per set. Do __2__ sets per session. Do __2__ sessions per day.  Pelvic Tilt: Posterior - Legs Bent (Supine)   Straight Leg Raise   Tighten stomach and slowly raise locked right leg __4__ inches from floor. Repeat __10-30__ times per set. Do __2__ sets per session. Do __2__ sessions per day.

## 2015-09-09 DIAGNOSIS — Z96651 Presence of right artificial knee joint: Secondary | ICD-10-CM | POA: Diagnosis not present

## 2015-09-09 DIAGNOSIS — Z471 Aftercare following joint replacement surgery: Secondary | ICD-10-CM | POA: Diagnosis not present

## 2015-10-04 DIAGNOSIS — Z970 Presence of artificial eye: Secondary | ICD-10-CM | POA: Diagnosis not present

## 2015-10-04 DIAGNOSIS — H5202 Hypermetropia, left eye: Secondary | ICD-10-CM | POA: Diagnosis not present

## 2015-10-04 DIAGNOSIS — H52222 Regular astigmatism, left eye: Secondary | ICD-10-CM | POA: Diagnosis not present

## 2015-10-04 DIAGNOSIS — H2512 Age-related nuclear cataract, left eye: Secondary | ICD-10-CM | POA: Diagnosis not present

## 2015-10-17 ENCOUNTER — Other Ambulatory Visit: Payer: Self-pay | Admitting: Family Medicine

## 2015-11-14 DIAGNOSIS — L570 Actinic keratosis: Secondary | ICD-10-CM | POA: Diagnosis not present

## 2015-11-14 DIAGNOSIS — D2239 Melanocytic nevi of other parts of face: Secondary | ICD-10-CM | POA: Diagnosis not present

## 2015-11-14 DIAGNOSIS — D485 Neoplasm of uncertain behavior of skin: Secondary | ICD-10-CM | POA: Diagnosis not present

## 2015-11-14 DIAGNOSIS — B078 Other viral warts: Secondary | ICD-10-CM | POA: Diagnosis not present

## 2015-11-14 DIAGNOSIS — C44622 Squamous cell carcinoma of skin of right upper limb, including shoulder: Secondary | ICD-10-CM | POA: Diagnosis not present

## 2015-11-14 DIAGNOSIS — Z85828 Personal history of other malignant neoplasm of skin: Secondary | ICD-10-CM | POA: Diagnosis not present

## 2015-11-17 DIAGNOSIS — C44622 Squamous cell carcinoma of skin of right upper limb, including shoulder: Secondary | ICD-10-CM | POA: Diagnosis not present

## 2015-12-05 ENCOUNTER — Ambulatory Visit (INDEPENDENT_AMBULATORY_CARE_PROVIDER_SITE_OTHER): Payer: Medicare Other | Admitting: Family Medicine

## 2015-12-05 ENCOUNTER — Encounter: Payer: Self-pay | Admitting: Family Medicine

## 2015-12-05 VITALS — BP 136/75 | HR 58 | Temp 97.7°F | Ht 68.0 in | Wt 198.4 lb

## 2015-12-05 DIAGNOSIS — K219 Gastro-esophageal reflux disease without esophagitis: Secondary | ICD-10-CM | POA: Diagnosis not present

## 2015-12-05 DIAGNOSIS — M19011 Primary osteoarthritis, right shoulder: Secondary | ICD-10-CM

## 2015-12-05 DIAGNOSIS — J209 Acute bronchitis, unspecified: Secondary | ICD-10-CM

## 2015-12-05 DIAGNOSIS — I119 Hypertensive heart disease without heart failure: Secondary | ICD-10-CM | POA: Diagnosis not present

## 2015-12-05 MED ORDER — OMEPRAZOLE 40 MG PO CPDR: 40.0000 mg | DELAYED_RELEASE_CAPSULE | Freq: Every day | ORAL | 3 refills | Status: DC

## 2015-12-05 MED ORDER — METHYLPREDNISOLONE ACETATE 80 MG/ML IJ SUSP
80.0000 mg | Freq: Once | INTRAMUSCULAR | Status: AC
  Administered 2015-12-05: 80 mg via INTRAMUSCULAR

## 2015-12-05 MED ORDER — ALBUTEROL SULFATE HFA 108 (90 BASE) MCG/ACT IN AERS: 2.0000 | INHALATION_SPRAY | Freq: Four times a day (QID) | RESPIRATORY_TRACT | 0 refills | Status: DC | PRN

## 2015-12-05 MED ORDER — ATORVASTATIN CALCIUM 20 MG PO TABS: 20.0000 mg | ORAL_TABLET | Freq: Every day | ORAL | 3 refills | Status: DC

## 2015-12-05 MED ORDER — AMLODIPINE BESYLATE 5 MG PO TABS: 5.0000 mg | ORAL_TABLET | Freq: Every day | ORAL | 3 refills | Status: DC

## 2015-12-05 MED ORDER — CEFTRIAXONE SODIUM 1 G IJ SOLR
1.0000 g | Freq: Once | INTRAMUSCULAR | Status: AC
  Administered 2015-12-05: 1 g via INTRAMUSCULAR

## 2015-12-05 MED ORDER — AZITHROMYCIN 250 MG PO TABS: ORAL_TABLET | ORAL | 0 refills | Status: DC

## 2015-12-05 MED ORDER — MELOXICAM 15 MG PO TABS: 15.0000 mg | ORAL_TABLET | Freq: Every day | ORAL | 3 refills | Status: DC

## 2015-12-05 NOTE — Progress Notes (Signed)
BP 136/75   Pulse (!) 58   Temp 97.7 F (36.5 C) (Oral)   Ht 5\' 8"  (1.727 m)   Wt 198 lb 6 oz (90 kg)   BMI 30.16 kg/m    Subjective:    Patient ID: Shawn Lambert, male    DOB: June 02, 1940, 75 y.o.   MRN: [id number]  HPI: Shawn Lambert is a 74 y.o. male presenting on 12/05/2015 for Chest congestion, productive cough (symptoms began 2 days ago); Sinusitis (runny nose, nasal congestion, headache); and Right shoulder pain   HPI Sinus congestion and cough Patient is coming in for his congestion and cough and runny nose and nasal congestion that's been going on for the past 2 days. He denies any fevers or chills or shortness of breath or wheezing. He denies any sick contacts that he knows of. He has used some over-the-counter sinus and cold medications without much success. He feels like it's starting to work its way down into his chest.  Right shoulder pain Patient has right shoulder pain on the upper outer part of his shoulder that is low-grade but has been increasing in frequency over the past few months. He does do a lot of work in his yard where he is using his arms a lot. He says it doesn't hurt when he is using his arms but if he sits down for a few minutes and stops and he has a lot of stiffness and tightness in that arm and he actually has to take the other on the left and up over his head and that loosens up and is able to continue with what he was doing. He denies any fevers or chills or overlying redness or warmth. He denies any numbness or weakness in that arm.  Patient requests a Refill of medications for GERD and hypertension  Relevant past medical, surgical, family and social history reviewed and updated as indicated. Interim medical history since our last visit reviewed. Allergies and medications reviewed and updated.  Review of Systems  Constitutional: Negative for chills and fever.  HENT: Positive for congestion, postnasal drip, rhinorrhea, sinus pressure, sneezing and  sore throat. Negative for ear discharge, ear pain and voice change.   Eyes: Negative for pain, discharge, redness and visual disturbance.  Respiratory: Positive for cough. Negative for shortness of breath and wheezing.   Cardiovascular: Negative for chest pain and leg swelling.  Musculoskeletal: Positive for arthralgias. Negative for gait problem.  Skin: Negative for rash.  All other systems reviewed and are negative.   Per HPI unless specifically indicated above     Medication List       Accurate as of 12/05/15  4:17 PM. Always use your most recent med list.          albuterol 108 (90 Base) MCG/ACT inhaler Commonly known as:  PROVENTIL HFA;VENTOLIN HFA Inhale 2 puffs into the lungs every 6 (six) hours as needed for wheezing or shortness of breath.   amLODipine 5 MG tablet Commonly known as:  NORVASC Take 1 tablet (5 mg total) by mouth daily.   atorvastatin 20 MG tablet Commonly known as:  LIPITOR Take 1 tablet (20 mg total) by mouth daily.   azithromycin 250 MG tablet Commonly known as:  ZITHROMAX Take 2 the first day and then one each day after.   Calcium-Vitamin D 600-200 MG-UNIT Caps Take 2 tablets by mouth daily. Reported on 08/01/2015   cholecalciferol 1000 units tablet Commonly known as:  VITAMIN D Take 1,000 Units  by mouth daily. Reported on 08/01/2015   [employer] SPECTRAVITE ADULT 50+ PO Take 1 tablet by mouth daily. Reported on 08/01/2015   fish oil-omega-3 fatty acids 1000 MG capsule Take 2 g by mouth daily. Reported on 08/01/2015   meloxicam 15 MG tablet Commonly known as:  MOBIC Take 1 tablet (15 mg total) by mouth daily.   nitroGLYCERIN 0.4 MG SL tablet Commonly known as:  NITROSTAT Place 1 tablet (0.4 mg total) under the tongue every 5 (five) minutes as needed.   omeprazole 40 MG capsule Commonly known as:  PRILOSEC Take 1 capsule (40 mg total) by mouth daily.   vitamin E 400 UNIT capsule Take 400 Units by mouth daily. Reported on 08/01/2015           Objective:    BP 136/75   Pulse (!) 58   Temp 97.7 F (36.5 C) (Oral)   Ht 5\' 8"  (1.727 m)   Wt 198 lb 6 oz (90 kg)   BMI 30.16 kg/m   Wt Readings from Last 3 Encounters:  12/05/15 198 lb 6 oz (90 kg)  07/26/15 190 lb (86.2 kg)  07/15/15 190 lb (86.2 kg)    Physical Exam  Constitutional: He is oriented to person, place, and time. He appears well-developed and well-nourished. No distress.  HENT:  Right Ear: Tympanic membrane, external ear and ear canal normal.  Left Ear: Tympanic membrane, external ear and ear canal normal.  Nose: Mucosal edema and rhinorrhea present. No sinus tenderness. No epistaxis. Right sinus exhibits no maxillary sinus tenderness and no frontal sinus tenderness. Left sinus exhibits no maxillary sinus tenderness and no frontal sinus tenderness.  Mouth/Throat: Uvula is midline and mucous membranes are normal. Posterior oropharyngeal edema and posterior oropharyngeal erythema present. No oropharyngeal exudate or tonsillar abscesses.  Eyes: Conjunctivae and EOM are normal. Pupils are equal, round, and reactive to light. Right eye exhibits no discharge. No scleral icterus.  Neck: Neck supple. No thyromegaly present.  Cardiovascular: Normal rate, regular rhythm, normal heart sounds and intact distal pulses.   No murmur heard. Pulmonary/Chest: Effort normal and breath sounds normal. No respiratory distress. He has no wheezes. He has no rales.  Musculoskeletal: Normal range of motion. He exhibits no edema.       Right shoulder: He exhibits tenderness (Mild tenderness over the acromioclavicular joint) and crepitus. He exhibits normal range of motion, no effusion, no deformity, normal pulse and normal strength.  Lymphadenopathy:    He has no cervical adenopathy.  Neurological: He is alert and oriented to person, place, and time. Coordination normal.  Skin: Skin is warm and dry. No rash noted. He is not diaphoretic.  Psychiatric: He has a normal mood and affect.  His behavior is normal.  Nursing note and vitals reviewed.     Assessment & Plan:   Problem List Items Addressed This Visit      Cardiovascular and Mediastinum   HYPERTENSION, HEART CONTROLLED W/O ASSOC CHF    Refill      Relevant Medications   atorvastatin (LIPITOR) 20 MG tablet   amLODipine (NORVASC) 5 MG tablet     Digestive   GERD    Refill      Relevant Medications   omeprazole (PRILOSEC) 40 MG capsule    Other Visit Diagnoses    Osteoarthritis of right shoulder, unspecified osteoarthritis type    -  Primary   Relevant Medications   methylPREDNISolone acetate (DEPO-MEDROL) injection 80 mg (Completed)   meloxicam (MOBIC) 15 MG tablet  Acute bronchitis, unspecified organism       Relevant Medications   azithromycin (ZITHROMAX) 250 MG tablet   methylPREDNISolone acetate (DEPO-MEDROL) injection 80 mg (Completed)   cefTRIAXone (ROCEPHIN) injection 1 g (Completed)   albuterol (PROVENTIL HFA;VENTOLIN HFA) 108 (90 Base) MCG/ACT inhaler       Follow up plan: Return in about 3 months (around 03/05/2016), or if symptoms worsen or fail to improve, for Recheck hypertension and GERD.  Counseling provided for all of the vaccine components No orders of the defined types were placed in this encounter.   Caryl Pina, MD [facility] 12/05/2015, 4:17 PM

## 2015-12-07 ENCOUNTER — Telehealth: Payer: Self-pay | Admitting: Family Medicine

## 2015-12-07 MED ORDER — BENZONATATE 100 MG PO CAPS: 100.0000 mg | ORAL_CAPSULE | Freq: Two times a day (BID) | ORAL | 0 refills | Status: DC | PRN

## 2015-12-07 NOTE — Assessment & Plan Note (Signed)
Refill

## 2015-12-07 NOTE — Telephone Encounter (Signed)
Patient aware.

## 2015-12-08 ENCOUNTER — Telehealth: Payer: Self-pay | Admitting: Family Medicine

## 2016-01-22 ENCOUNTER — Other Ambulatory Visit: Payer: Self-pay | Admitting: Family Medicine

## 2016-03-14 ENCOUNTER — Other Ambulatory Visit: Payer: Self-pay | Admitting: Family Medicine

## 2016-05-15 DIAGNOSIS — D485 Neoplasm of uncertain behavior of skin: Secondary | ICD-10-CM | POA: Diagnosis not present

## 2016-05-15 DIAGNOSIS — Z85828 Personal history of other malignant neoplasm of skin: Secondary | ICD-10-CM | POA: Diagnosis not present

## 2016-05-15 DIAGNOSIS — L570 Actinic keratosis: Secondary | ICD-10-CM | POA: Diagnosis not present

## 2016-05-15 DIAGNOSIS — L820 Inflamed seborrheic keratosis: Secondary | ICD-10-CM | POA: Diagnosis not present

## 2016-06-08 ENCOUNTER — Other Ambulatory Visit: Payer: Self-pay | Admitting: Family Medicine

## 2016-06-11 NOTE — Telephone Encounter (Signed)
Was told last time NTBS, last labs 04/17

## 2016-07-12 ENCOUNTER — Other Ambulatory Visit: Payer: Self-pay | Admitting: Family Medicine

## 2016-07-12 NOTE — Telephone Encounter (Signed)
Last seen 12/05/2015

## 2016-08-26 ENCOUNTER — Other Ambulatory Visit: Payer: Self-pay | Admitting: Family Medicine

## 2016-08-26 DIAGNOSIS — K219 Gastro-esophageal reflux disease without esophagitis: Secondary | ICD-10-CM

## 2016-08-26 DIAGNOSIS — I119 Hypertensive heart disease without heart failure: Secondary | ICD-10-CM

## 2016-11-01 DIAGNOSIS — H2512 Age-related nuclear cataract, left eye: Secondary | ICD-10-CM | POA: Diagnosis not present

## 2016-11-01 DIAGNOSIS — Z970 Presence of artificial eye: Secondary | ICD-10-CM | POA: Diagnosis not present

## 2016-11-01 DIAGNOSIS — H5202 Hypermetropia, left eye: Secondary | ICD-10-CM | POA: Diagnosis not present

## 2016-11-01 DIAGNOSIS — H52222 Regular astigmatism, left eye: Secondary | ICD-10-CM | POA: Diagnosis not present

## 2016-11-08 ENCOUNTER — Ambulatory Visit (INDEPENDENT_AMBULATORY_CARE_PROVIDER_SITE_OTHER): Payer: Medicare Other | Admitting: Family Medicine

## 2016-11-08 ENCOUNTER — Encounter: Payer: Self-pay | Admitting: Family Medicine

## 2016-11-08 VITALS — BP 134/87 | HR 76 | Temp 98.0°F | Ht 68.0 in | Wt 203.0 lb

## 2016-11-08 DIAGNOSIS — R739 Hyperglycemia, unspecified: Secondary | ICD-10-CM

## 2016-11-08 DIAGNOSIS — M19011 Primary osteoarthritis, right shoulder: Secondary | ICD-10-CM

## 2016-11-08 DIAGNOSIS — E785 Hyperlipidemia, unspecified: Secondary | ICD-10-CM

## 2016-11-08 DIAGNOSIS — I119 Hypertensive heart disease without heart failure: Secondary | ICD-10-CM | POA: Diagnosis not present

## 2016-11-08 DIAGNOSIS — K219 Gastro-esophageal reflux disease without esophagitis: Secondary | ICD-10-CM | POA: Diagnosis not present

## 2016-11-08 DIAGNOSIS — D649 Anemia, unspecified: Secondary | ICD-10-CM | POA: Diagnosis not present

## 2016-11-08 DIAGNOSIS — Z23 Encounter for immunization: Secondary | ICD-10-CM | POA: Diagnosis not present

## 2016-11-08 LAB — CBC WITH DIFFERENTIAL/PLATELET
BASOS: 0 %
Basophils Absolute: 0 10*3/uL (ref 0.0–0.2)
EOS (ABSOLUTE): 0.3 10*3/uL (ref 0.0–0.4)
EOS: 5 %
HEMATOCRIT: 38.3 % (ref 37.5–51.0)
Hemoglobin: 12.2 g/dL — ABNORMAL LOW (ref 13.0–17.7)
Immature Grans (Abs): 0 10*3/uL (ref 0.0–0.1)
Immature Granulocytes: 0 %
LYMPHS ABS: 0.9 10*3/uL (ref 0.7–3.1)
Lymphs: 18 %
MCH: 29 pg (ref 26.6–33.0)
MCHC: 31.9 g/dL (ref 31.5–35.7)
MCV: 91 fL (ref 79–97)
MONOS ABS: 0.5 10*3/uL (ref 0.1–0.9)
Monocytes: 11 %
NEUTROS ABS: 3.1 10*3/uL (ref 1.4–7.0)
Neutrophils: 66 %
PLATELETS: 209 10*3/uL (ref 150–379)
RBC: 4.21 x10E6/uL (ref 4.14–5.80)
RDW: 15.2 % (ref 12.3–15.4)
WBC: 4.7 10*3/uL (ref 3.4–10.8)

## 2016-11-08 LAB — CMP14+EGFR
A/G RATIO: 1.6 (ref 1.2–2.2)
ALT: 19 IU/L (ref 0–44)
AST: 19 IU/L (ref 0–40)
Albumin: 4.2 g/dL (ref 3.5–4.8)
Alkaline Phosphatase: 104 IU/L (ref 39–117)
BILIRUBIN TOTAL: 0.5 mg/dL (ref 0.0–1.2)
BUN/Creatinine Ratio: 17 (ref 10–24)
BUN: 18 mg/dL (ref 8–27)
CALCIUM: 9.6 mg/dL (ref 8.6–10.2)
CHLORIDE: 103 mmol/L (ref 96–106)
CO2: 25 mmol/L (ref 20–29)
Creatinine, Ser: 1.09 mg/dL (ref 0.76–1.27)
GFR, EST AFRICAN AMERICAN: 76 mL/min/{1.73_m2} (ref >59)
GFR, EST NON AFRICAN AMERICAN: 66 mL/min/{1.73_m2} (ref >59)
GLOBULIN, TOTAL: 2.7 g/dL (ref 1.5–4.5)
Glucose: 115 mg/dL — ABNORMAL HIGH (ref 65–99)
POTASSIUM: 5 mmol/L (ref 3.5–5.2)
SODIUM: 141 mmol/L (ref 134–144)
Total Protein: 6.9 g/dL (ref 6.0–8.5)

## 2016-11-08 LAB — LIPID PANEL
CHOL/HDL RATIO: 3.4 ratio (ref 0.0–5.0)
Cholesterol, Total: 165 mg/dL (ref 100–199)
HDL: 49 mg/dL (ref >39)
LDL Calculated: 104 mg/dL — ABNORMAL HIGH (ref 0–99)
TRIGLYCERIDES: 61 mg/dL (ref 0–149)
VLDL Cholesterol Cal: 12 mg/dL (ref 5–40)

## 2016-11-08 LAB — BAYER DCA HB A1C WAIVED: HB A1C: 6.7 % (ref <7.0)

## 2016-11-08 MED ORDER — MELOXICAM 15 MG PO TABS: 15.0000 mg | ORAL_TABLET | Freq: Every day | ORAL | 5 refills | Status: DC

## 2016-11-08 MED ORDER — NITROGLYCERIN 0.4 MG SL SUBL: 0.4000 mg | SUBLINGUAL_TABLET | SUBLINGUAL | 3 refills | Status: DC | PRN

## 2016-11-08 MED ORDER — GABAPENTIN 300 MG PO CAPS: 300.0000 mg | ORAL_CAPSULE | Freq: Two times a day (BID) | ORAL | 3 refills | Status: DC

## 2016-11-08 MED ORDER — OMEPRAZOLE 40 MG PO CPDR: 40.0000 mg | DELAYED_RELEASE_CAPSULE | Freq: Every day | ORAL | 3 refills | Status: DC

## 2016-11-08 MED ORDER — AMLODIPINE BESYLATE 5 MG PO TABS: 5.0000 mg | ORAL_TABLET | Freq: Every day | ORAL | 1 refills | Status: DC

## 2016-11-08 MED ORDER — ATORVASTATIN CALCIUM 20 MG PO TABS: 20.0000 mg | ORAL_TABLET | Freq: Every day | ORAL | 1 refills | Status: DC

## 2016-11-08 NOTE — Progress Notes (Signed)
BP 134/87   Pulse 76   Temp 98 F (36.7 C) (Oral)   Ht '5\' 8"'  (1.727 m)   Wt 203 lb (92.1 kg)   BMI 30.87 kg/m    Subjective:    Patient ID: Shawn Lambert, male    DOB: [birth year], 76 y.o.   MRN: [id number]  HPI: EDRIS Lambert is a 76 y.o. male presenting on 11/08/2016 for Hyperlipidemia (followup; patient is fasting) and Right shoulder pain (has worsened over the last year, hurts all the time; had taken Gabapentin in the past and it did seem to help and helped him sleep)   HPI Hypertension Patient is currently on Amlodipine, and their blood pressure today is 134/87. Patient denies any lightheadedness or dizziness. Patient denies headaches, blurred vision, chest pains, shortness of breath, or weakness. Denies any side effects from medication and is content with current medication.   Hyperlipidemia Patient is coming in for recheck of his hyperlipidemia. The patient is currently taking atorvastatin. They deny any issues with myalgias or history of liver damage from it. They deny any focal numbness or weakness or chest pain.   GERD Patient is currently on omeprazole for his GERD and has had a history of anemia with unspecific cause. Patient denies any blood loss that he knows of or any blood in his stool. He denies any abdominal pain or acid reflux or heartburn. He says he is doing relatively well with the medication.  Right shoulder pain Patient has been having right shoulder pain that worsened over the past year. He says it hurts all the time now and especially if he rolls over on that side in the night it wakes him up. He said that he did have gabapentin in the past and it seemed to help and help him sleep and helped him significantly with the pain. He said he stopped it because he felt like he didn't need it for well but he would like to try again. The pain is mostly on the lateral aspect of his shoulder and does not radiate anywhere else. He denies any numbness or weakness. He does  have some difficulty with range of motion overhead because of the pain.  Patient had elevated blood sugar on his last draw and will do an A1c today. He denies any urinary frequency or increased thirst.  Relevant past medical, surgical, family and social history reviewed and updated as indicated. Interim medical history since our last visit reviewed. Allergies and medications reviewed and updated.  Review of Systems  Constitutional: Negative for chills and fever.  Eyes: Negative for discharge.  Respiratory: Negative for shortness of breath and wheezing.   Cardiovascular: Negative for chest pain and leg swelling.  Gastrointestinal: Positive for abdominal pain. Negative for abdominal distention, blood in stool and constipation.  Musculoskeletal: Positive for arthralgias. Negative for back pain and gait problem.  Skin: Negative for rash.  Neurological: Negative for dizziness, weakness, numbness and headaches.  All other systems reviewed and are negative.   Per HPI unless specifically indicated above     Objective:    BP 134/87   Pulse 76   Temp 98 F (36.7 C) (Oral)   Ht '5\' 8"'  (1.727 m)   Wt 203 lb (92.1 kg)   BMI 30.87 kg/m   Wt Readings from Last 3 Encounters:  11/08/16 203 lb (92.1 kg)  12/05/15 198 lb 6 oz (90 kg)  07/26/15 190 lb (86.2 kg)    Physical Exam  Constitutional: He is oriented  to person, place, and time. He appears well-developed and well-nourished. No distress.  Eyes: Conjunctivae are normal. No scleral icterus.  Neck: Neck supple. No thyromegaly present.  Cardiovascular: Normal rate, regular rhythm, normal heart sounds and intact distal pulses.   No murmur heard. Pulmonary/Chest: Effort normal and breath sounds normal. No respiratory distress. He has no wheezes.  Abdominal: Soft. Bowel sounds are normal. He exhibits no distension. There is no tenderness. There is no rebound.  Musculoskeletal: Normal range of motion. He exhibits no edema.       Right  shoulder: He exhibits tenderness. He exhibits normal range of motion (No loss of range of motion but painful with overhead range of motion and across body range of motion.), no deformity and normal strength.  Lymphadenopathy:    He has no cervical adenopathy.  Neurological: He is alert and oriented to person, place, and time. Coordination normal.  Skin: Skin is warm and dry. No rash noted. He is not diaphoretic.  Psychiatric: He has a normal mood and affect. His behavior is normal.  Nursing note and vitals reviewed.       Assessment & Plan:   Problem List Items Addressed This Visit      Cardiovascular and Mediastinum   HYPERTENSION, HEART CONTROLLED W/O ASSOC CHF   Relevant Medications   nitroGLYCERIN (NITROSTAT) 0.4 MG SL tablet   atorvastatin (LIPITOR) 20 MG tablet   amLODipine (NORVASC) 5 MG tablet   Other Relevant Orders   CMP14+EGFR (Completed)     Digestive   GERD   Relevant Medications   omeprazole (PRILOSEC) 40 MG capsule   Other Relevant Orders   CBC with Differential/Platelet (Completed)     Other   Hyperlipidemia LDL goal <130 - Primary   Relevant Medications   nitroGLYCERIN (NITROSTAT) 0.4 MG SL tablet   atorvastatin (LIPITOR) 20 MG tablet   amLODipine (NORVASC) 5 MG tablet   Other Relevant Orders   Lipid panel (Completed)    Other Visit Diagnoses    Anemia, unspecified type       Relevant Orders   CBC with Differential/Platelet (Completed)   Elevated blood sugar       Relevant Orders   Bayer DCA Hb A1c Waived (Completed)   Osteoarthritis of right shoulder, unspecified osteoarthritis type       Relevant Medications   meloxicam (MOBIC) 15 MG tablet   gabapentin (NEURONTIN) 300 MG capsule   Other Relevant Orders   Ambulatory referral to Orthopedic Surgery       Follow up plan: Return in about 6 months (around 05/11/2017), or if symptoms worsen or fail to improve, for Cholesterol recheck.  Counseling provided for all of the vaccine components Orders  Placed This Encounter  Procedures  . Pneumococcal polysaccharide vaccine 23-valent greater than or equal to 2yo subcutaneous/IM  . CBC with Differential/Platelet  . CMP14+EGFR  . Lipid panel  . Bayer [facility] Hb A1c [place], MD [facility] 11/08/2016, 11:03 AM

## 2016-11-12 DIAGNOSIS — L570 Actinic keratosis: Secondary | ICD-10-CM | POA: Diagnosis not present

## 2016-11-12 DIAGNOSIS — Z85828 Personal history of other malignant neoplasm of skin: Secondary | ICD-10-CM | POA: Diagnosis not present

## 2016-11-12 DIAGNOSIS — D485 Neoplasm of uncertain behavior of skin: Secondary | ICD-10-CM | POA: Diagnosis not present

## 2016-11-15 DIAGNOSIS — M25511 Pain in right shoulder: Secondary | ICD-10-CM | POA: Diagnosis not present

## 2016-12-11 DIAGNOSIS — Z1212 Encounter for screening for malignant neoplasm of rectum: Secondary | ICD-10-CM | POA: Diagnosis not present

## 2016-12-11 DIAGNOSIS — Z1211 Encounter for screening for malignant neoplasm of colon: Secondary | ICD-10-CM | POA: Diagnosis not present

## 2016-12-18 DIAGNOSIS — Z043 Encounter for examination and observation following other accident: Secondary | ICD-10-CM | POA: Diagnosis not present

## 2016-12-18 DIAGNOSIS — Z049 Encounter for examination and observation for unspecified reason: Secondary | ICD-10-CM | POA: Diagnosis not present

## 2016-12-18 DIAGNOSIS — M19072 Primary osteoarthritis, left ankle and foot: Secondary | ICD-10-CM | POA: Diagnosis not present

## 2016-12-18 DIAGNOSIS — W5529XA Other contact with cow, initial encounter: Secondary | ICD-10-CM | POA: Diagnosis not present

## 2016-12-18 DIAGNOSIS — E785 Hyperlipidemia, unspecified: Secondary | ICD-10-CM | POA: Diagnosis present

## 2016-12-18 DIAGNOSIS — I82511 Chronic embolism and thrombosis of right femoral vein: Secondary | ICD-10-CM | POA: Diagnosis not present

## 2016-12-18 DIAGNOSIS — M25562 Pain in left knee: Secondary | ICD-10-CM | POA: Diagnosis not present

## 2016-12-18 DIAGNOSIS — Z96652 Presence of left artificial knee joint: Secondary | ICD-10-CM | POA: Diagnosis not present

## 2016-12-18 DIAGNOSIS — R21 Rash and other nonspecific skin eruption: Secondary | ICD-10-CM | POA: Diagnosis not present

## 2016-12-18 DIAGNOSIS — S72322A Displaced transverse fracture of shaft of left femur, initial encounter for closed fracture: Secondary | ICD-10-CM | POA: Diagnosis not present

## 2016-12-18 DIAGNOSIS — S32019D Unspecified fracture of first lumbar vertebra, subsequent encounter for fracture with routine healing: Secondary | ICD-10-CM | POA: Diagnosis not present

## 2016-12-18 DIAGNOSIS — S32039A Unspecified fracture of third lumbar vertebra, initial encounter for closed fracture: Secondary | ICD-10-CM | POA: Diagnosis not present

## 2016-12-18 DIAGNOSIS — S2243XD Multiple fractures of ribs, bilateral, subsequent encounter for fracture with routine healing: Secondary | ICD-10-CM | POA: Diagnosis not present

## 2016-12-18 DIAGNOSIS — S7292XA Unspecified fracture of left femur, initial encounter for closed fracture: Secondary | ICD-10-CM | POA: Diagnosis not present

## 2016-12-18 DIAGNOSIS — S2220XD Unspecified fracture of sternum, subsequent encounter for fracture with routine healing: Secondary | ICD-10-CM | POA: Diagnosis not present

## 2016-12-18 DIAGNOSIS — M1612 Unilateral primary osteoarthritis, left hip: Secondary | ICD-10-CM | POA: Diagnosis not present

## 2016-12-18 DIAGNOSIS — J90 Pleural effusion, not elsewhere classified: Secondary | ICD-10-CM | POA: Diagnosis not present

## 2016-12-18 DIAGNOSIS — W5589XA Other contact with other mammals, initial encounter: Secondary | ICD-10-CM | POA: Diagnosis not present

## 2016-12-18 DIAGNOSIS — G8911 Acute pain due to trauma: Secondary | ICD-10-CM | POA: Diagnosis not present

## 2016-12-18 DIAGNOSIS — I8001 Phlebitis and thrombophlebitis of superficial vessels of right lower extremity: Secondary | ICD-10-CM | POA: Diagnosis not present

## 2016-12-18 DIAGNOSIS — M8580 Other specified disorders of bone density and structure, unspecified site: Secondary | ICD-10-CM | POA: Diagnosis not present

## 2016-12-18 DIAGNOSIS — Z96653 Presence of artificial knee joint, bilateral: Secondary | ICD-10-CM | POA: Diagnosis present

## 2016-12-18 DIAGNOSIS — J984 Other disorders of lung: Secondary | ICD-10-CM | POA: Diagnosis not present

## 2016-12-18 DIAGNOSIS — I82413 Acute embolism and thrombosis of femoral vein, bilateral: Secondary | ICD-10-CM | POA: Diagnosis not present

## 2016-12-18 DIAGNOSIS — S32019A Unspecified fracture of first lumbar vertebra, initial encounter for closed fracture: Secondary | ICD-10-CM | POA: Diagnosis not present

## 2016-12-18 DIAGNOSIS — S72452A Displaced supracondylar fracture without intracondylar extension of lower end of left femur, initial encounter for closed fracture: Secondary | ICD-10-CM | POA: Diagnosis not present

## 2016-12-18 DIAGNOSIS — D62 Acute posthemorrhagic anemia: Secondary | ICD-10-CM | POA: Diagnosis not present

## 2016-12-18 DIAGNOSIS — R2242 Localized swelling, mass and lump, left lower limb: Secondary | ICD-10-CM | POA: Diagnosis not present

## 2016-12-18 DIAGNOSIS — M47816 Spondylosis without myelopathy or radiculopathy, lumbar region: Secondary | ICD-10-CM | POA: Diagnosis not present

## 2016-12-18 DIAGNOSIS — I444 Left anterior fascicular block: Secondary | ICD-10-CM | POA: Diagnosis not present

## 2016-12-18 DIAGNOSIS — I82412 Acute embolism and thrombosis of left femoral vein: Secondary | ICD-10-CM | POA: Diagnosis not present

## 2016-12-18 DIAGNOSIS — K219 Gastro-esophageal reflux disease without esophagitis: Secondary | ICD-10-CM | POA: Diagnosis not present

## 2016-12-18 DIAGNOSIS — M160 Bilateral primary osteoarthritis of hip: Secondary | ICD-10-CM | POA: Diagnosis not present

## 2016-12-18 DIAGNOSIS — I452 Bifascicular block: Secondary | ICD-10-CM | POA: Diagnosis not present

## 2016-12-18 DIAGNOSIS — R079 Chest pain, unspecified: Secondary | ICD-10-CM | POA: Diagnosis not present

## 2016-12-18 DIAGNOSIS — M9712XA Periprosthetic fracture around internal prosthetic left knee joint, initial encounter: Secondary | ICD-10-CM | POA: Insufficient documentation

## 2016-12-18 DIAGNOSIS — S2220XA Unspecified fracture of sternum, initial encounter for closed fracture: Secondary | ICD-10-CM | POA: Diagnosis not present

## 2016-12-18 DIAGNOSIS — S32009A Unspecified fracture of unspecified lumbar vertebra, initial encounter for closed fracture: Secondary | ICD-10-CM | POA: Insufficient documentation

## 2016-12-18 DIAGNOSIS — S299XXA Unspecified injury of thorax, initial encounter: Secondary | ICD-10-CM | POA: Diagnosis not present

## 2016-12-18 DIAGNOSIS — S72402A Unspecified fracture of lower end of left femur, initial encounter for closed fracture: Secondary | ICD-10-CM | POA: Diagnosis not present

## 2016-12-18 DIAGNOSIS — S2243XA Multiple fractures of ribs, bilateral, initial encounter for closed fracture: Secondary | ICD-10-CM | POA: Diagnosis present

## 2016-12-18 DIAGNOSIS — R42 Dizziness and giddiness: Secondary | ICD-10-CM | POA: Diagnosis not present

## 2016-12-18 DIAGNOSIS — R918 Other nonspecific abnormal finding of lung field: Secondary | ICD-10-CM | POA: Diagnosis not present

## 2016-12-18 DIAGNOSIS — S7292XD Unspecified fracture of left femur, subsequent encounter for closed fracture with routine healing: Secondary | ICD-10-CM | POA: Diagnosis not present

## 2016-12-18 DIAGNOSIS — S32029A Unspecified fracture of second lumbar vertebra, initial encounter for closed fracture: Secondary | ICD-10-CM | POA: Diagnosis not present

## 2016-12-18 DIAGNOSIS — S32039D Unspecified fracture of third lumbar vertebra, subsequent encounter for fracture with routine healing: Secondary | ICD-10-CM | POA: Diagnosis not present

## 2016-12-18 DIAGNOSIS — M7752 Other enthesopathy of left foot: Secondary | ICD-10-CM | POA: Diagnosis not present

## 2016-12-18 DIAGNOSIS — M9712XD Periprosthetic fracture around internal prosthetic left knee joint, subsequent encounter: Secondary | ICD-10-CM | POA: Diagnosis not present

## 2016-12-18 DIAGNOSIS — M311 Thrombotic microangiopathy: Secondary | ICD-10-CM | POA: Diagnosis not present

## 2016-12-18 DIAGNOSIS — I998 Other disorder of circulatory system: Secondary | ICD-10-CM | POA: Diagnosis not present

## 2016-12-18 DIAGNOSIS — I10 Essential (primary) hypertension: Secondary | ICD-10-CM | POA: Diagnosis not present

## 2016-12-18 DIAGNOSIS — M9702XA Periprosthetic fracture around internal prosthetic left hip joint, initial encounter: Secondary | ICD-10-CM | POA: Diagnosis not present

## 2016-12-18 DIAGNOSIS — G8918 Other acute postprocedural pain: Secondary | ICD-10-CM | POA: Diagnosis not present

## 2016-12-18 DIAGNOSIS — Z7982 Long term (current) use of aspirin: Secondary | ICD-10-CM | POA: Diagnosis not present

## 2016-12-18 DIAGNOSIS — R109 Unspecified abdominal pain: Secondary | ICD-10-CM | POA: Diagnosis not present

## 2016-12-18 DIAGNOSIS — S32038A Other fracture of third lumbar vertebra, initial encounter for closed fracture: Secondary | ICD-10-CM | POA: Diagnosis not present

## 2016-12-18 DIAGNOSIS — Z7409 Other reduced mobility: Secondary | ICD-10-CM | POA: Diagnosis not present

## 2016-12-18 DIAGNOSIS — S20219A Contusion of unspecified front wall of thorax, initial encounter: Secondary | ICD-10-CM | POA: Diagnosis not present

## 2016-12-18 DIAGNOSIS — E7800 Pure hypercholesterolemia, unspecified: Secondary | ICD-10-CM | POA: Diagnosis present

## 2016-12-18 DIAGNOSIS — S32028A Other fracture of second lumbar vertebra, initial encounter for closed fracture: Secondary | ICD-10-CM | POA: Diagnosis not present

## 2016-12-18 DIAGNOSIS — W19XXXA Unspecified fall, initial encounter: Secondary | ICD-10-CM | POA: Diagnosis not present

## 2016-12-18 DIAGNOSIS — S32029D Unspecified fracture of second lumbar vertebra, subsequent encounter for fracture with routine healing: Secondary | ICD-10-CM | POA: Diagnosis not present

## 2016-12-18 DIAGNOSIS — S32018A Other fracture of first lumbar vertebra, initial encounter for closed fracture: Secondary | ICD-10-CM | POA: Diagnosis not present

## 2016-12-18 HISTORY — PX: LEG SURGERY: SHX1003

## 2016-12-28 DIAGNOSIS — S7292XD Unspecified fracture of left femur, subsequent encounter for closed fracture with routine healing: Secondary | ICD-10-CM | POA: Diagnosis not present

## 2016-12-28 DIAGNOSIS — S2243XD Multiple fractures of ribs, bilateral, subsequent encounter for fracture with routine healing: Secondary | ICD-10-CM | POA: Diagnosis not present

## 2016-12-28 DIAGNOSIS — K219 Gastro-esophageal reflux disease without esophagitis: Secondary | ICD-10-CM | POA: Diagnosis not present

## 2016-12-28 DIAGNOSIS — M9712XD Periprosthetic fracture around internal prosthetic left knee joint, subsequent encounter: Secondary | ICD-10-CM | POA: Diagnosis not present

## 2016-12-28 DIAGNOSIS — S32029D Unspecified fracture of second lumbar vertebra, subsequent encounter for fracture with routine healing: Secondary | ICD-10-CM | POA: Diagnosis not present

## 2016-12-28 DIAGNOSIS — E785 Hyperlipidemia, unspecified: Secondary | ICD-10-CM | POA: Diagnosis not present

## 2016-12-28 DIAGNOSIS — S32019D Unspecified fracture of first lumbar vertebra, subsequent encounter for fracture with routine healing: Secondary | ICD-10-CM | POA: Diagnosis not present

## 2016-12-28 DIAGNOSIS — S32039D Unspecified fracture of third lumbar vertebra, subsequent encounter for fracture with routine healing: Secondary | ICD-10-CM | POA: Diagnosis not present

## 2016-12-28 DIAGNOSIS — S2220XD Unspecified fracture of sternum, subsequent encounter for fracture with routine healing: Secondary | ICD-10-CM | POA: Diagnosis not present

## 2016-12-28 DIAGNOSIS — I10 Essential (primary) hypertension: Secondary | ICD-10-CM | POA: Diagnosis not present

## 2016-12-28 DIAGNOSIS — G8918 Other acute postprocedural pain: Secondary | ICD-10-CM | POA: Diagnosis not present

## 2016-12-28 DIAGNOSIS — I82413 Acute embolism and thrombosis of femoral vein, bilateral: Secondary | ICD-10-CM | POA: Diagnosis not present

## 2016-12-31 DIAGNOSIS — E785 Hyperlipidemia, unspecified: Secondary | ICD-10-CM | POA: Diagnosis not present

## 2016-12-31 DIAGNOSIS — G8918 Other acute postprocedural pain: Secondary | ICD-10-CM | POA: Diagnosis not present

## 2016-12-31 DIAGNOSIS — M9712XD Periprosthetic fracture around internal prosthetic left knee joint, subsequent encounter: Secondary | ICD-10-CM | POA: Diagnosis not present

## 2016-12-31 DIAGNOSIS — S32029D Unspecified fracture of second lumbar vertebra, subsequent encounter for fracture with routine healing: Secondary | ICD-10-CM | POA: Diagnosis not present

## 2016-12-31 DIAGNOSIS — I82413 Acute embolism and thrombosis of femoral vein, bilateral: Secondary | ICD-10-CM | POA: Diagnosis not present

## 2016-12-31 DIAGNOSIS — I10 Essential (primary) hypertension: Secondary | ICD-10-CM | POA: Diagnosis not present

## 2016-12-31 DIAGNOSIS — K219 Gastro-esophageal reflux disease without esophagitis: Secondary | ICD-10-CM | POA: Diagnosis not present

## 2016-12-31 DIAGNOSIS — S2220XD Unspecified fracture of sternum, subsequent encounter for fracture with routine healing: Secondary | ICD-10-CM | POA: Diagnosis not present

## 2016-12-31 DIAGNOSIS — S32039D Unspecified fracture of third lumbar vertebra, subsequent encounter for fracture with routine healing: Secondary | ICD-10-CM | POA: Diagnosis not present

## 2016-12-31 DIAGNOSIS — S32019D Unspecified fracture of first lumbar vertebra, subsequent encounter for fracture with routine healing: Secondary | ICD-10-CM | POA: Diagnosis not present

## 2016-12-31 DIAGNOSIS — S2243XD Multiple fractures of ribs, bilateral, subsequent encounter for fracture with routine healing: Secondary | ICD-10-CM | POA: Diagnosis not present

## 2016-12-31 DIAGNOSIS — S72402D Unspecified fracture of lower end of left femur, subsequent encounter for closed fracture with routine healing: Secondary | ICD-10-CM | POA: Diagnosis not present

## 2017-01-01 DIAGNOSIS — S32039D Unspecified fracture of third lumbar vertebra, subsequent encounter for fracture with routine healing: Secondary | ICD-10-CM | POA: Diagnosis not present

## 2017-01-01 DIAGNOSIS — I82413 Acute embolism and thrombosis of femoral vein, bilateral: Secondary | ICD-10-CM | POA: Diagnosis not present

## 2017-01-01 DIAGNOSIS — E785 Hyperlipidemia, unspecified: Secondary | ICD-10-CM | POA: Diagnosis not present

## 2017-01-01 DIAGNOSIS — S2243XD Multiple fractures of ribs, bilateral, subsequent encounter for fracture with routine healing: Secondary | ICD-10-CM | POA: Diagnosis not present

## 2017-01-01 DIAGNOSIS — S32019D Unspecified fracture of first lumbar vertebra, subsequent encounter for fracture with routine healing: Secondary | ICD-10-CM | POA: Diagnosis not present

## 2017-01-01 DIAGNOSIS — S32029D Unspecified fracture of second lumbar vertebra, subsequent encounter for fracture with routine healing: Secondary | ICD-10-CM | POA: Diagnosis not present

## 2017-01-01 DIAGNOSIS — S7292XD Unspecified fracture of left femur, subsequent encounter for closed fracture with routine healing: Secondary | ICD-10-CM | POA: Diagnosis not present

## 2017-01-01 DIAGNOSIS — S2220XD Unspecified fracture of sternum, subsequent encounter for fracture with routine healing: Secondary | ICD-10-CM | POA: Diagnosis not present

## 2017-01-01 DIAGNOSIS — I10 Essential (primary) hypertension: Secondary | ICD-10-CM | POA: Diagnosis not present

## 2017-01-01 DIAGNOSIS — M9712XD Periprosthetic fracture around internal prosthetic left knee joint, subsequent encounter: Secondary | ICD-10-CM | POA: Diagnosis not present

## 2017-01-01 DIAGNOSIS — K219 Gastro-esophageal reflux disease without esophagitis: Secondary | ICD-10-CM | POA: Diagnosis not present

## 2017-01-01 DIAGNOSIS — G8918 Other acute postprocedural pain: Secondary | ICD-10-CM | POA: Diagnosis not present

## 2017-01-02 DIAGNOSIS — I82413 Acute embolism and thrombosis of femoral vein, bilateral: Secondary | ICD-10-CM | POA: Diagnosis not present

## 2017-01-02 DIAGNOSIS — S2243XD Multiple fractures of ribs, bilateral, subsequent encounter for fracture with routine healing: Secondary | ICD-10-CM | POA: Diagnosis not present

## 2017-01-02 DIAGNOSIS — E785 Hyperlipidemia, unspecified: Secondary | ICD-10-CM | POA: Diagnosis not present

## 2017-01-02 DIAGNOSIS — S2220XD Unspecified fracture of sternum, subsequent encounter for fracture with routine healing: Secondary | ICD-10-CM | POA: Diagnosis not present

## 2017-01-02 DIAGNOSIS — S7292XD Unspecified fracture of left femur, subsequent encounter for closed fracture with routine healing: Secondary | ICD-10-CM | POA: Diagnosis not present

## 2017-01-02 DIAGNOSIS — I10 Essential (primary) hypertension: Secondary | ICD-10-CM | POA: Diagnosis not present

## 2017-01-02 DIAGNOSIS — S32039D Unspecified fracture of third lumbar vertebra, subsequent encounter for fracture with routine healing: Secondary | ICD-10-CM | POA: Diagnosis not present

## 2017-01-02 DIAGNOSIS — S32019D Unspecified fracture of first lumbar vertebra, subsequent encounter for fracture with routine healing: Secondary | ICD-10-CM | POA: Diagnosis not present

## 2017-01-02 DIAGNOSIS — S32029D Unspecified fracture of second lumbar vertebra, subsequent encounter for fracture with routine healing: Secondary | ICD-10-CM | POA: Diagnosis not present

## 2017-01-02 DIAGNOSIS — G8918 Other acute postprocedural pain: Secondary | ICD-10-CM | POA: Diagnosis not present

## 2017-01-02 DIAGNOSIS — K219 Gastro-esophageal reflux disease without esophagitis: Secondary | ICD-10-CM | POA: Diagnosis not present

## 2017-01-02 DIAGNOSIS — M9712XD Periprosthetic fracture around internal prosthetic left knee joint, subsequent encounter: Secondary | ICD-10-CM | POA: Diagnosis not present

## 2017-01-03 DIAGNOSIS — S2243XD Multiple fractures of ribs, bilateral, subsequent encounter for fracture with routine healing: Secondary | ICD-10-CM | POA: Diagnosis not present

## 2017-01-03 DIAGNOSIS — S2220XD Unspecified fracture of sternum, subsequent encounter for fracture with routine healing: Secondary | ICD-10-CM | POA: Diagnosis not present

## 2017-01-03 DIAGNOSIS — G8918 Other acute postprocedural pain: Secondary | ICD-10-CM | POA: Diagnosis not present

## 2017-01-03 DIAGNOSIS — S32029D Unspecified fracture of second lumbar vertebra, subsequent encounter for fracture with routine healing: Secondary | ICD-10-CM | POA: Diagnosis not present

## 2017-01-03 DIAGNOSIS — K219 Gastro-esophageal reflux disease without esophagitis: Secondary | ICD-10-CM | POA: Diagnosis not present

## 2017-01-03 DIAGNOSIS — S7292XD Unspecified fracture of left femur, subsequent encounter for closed fracture with routine healing: Secondary | ICD-10-CM | POA: Diagnosis not present

## 2017-01-03 DIAGNOSIS — S32039D Unspecified fracture of third lumbar vertebra, subsequent encounter for fracture with routine healing: Secondary | ICD-10-CM | POA: Diagnosis not present

## 2017-01-03 DIAGNOSIS — E785 Hyperlipidemia, unspecified: Secondary | ICD-10-CM | POA: Diagnosis not present

## 2017-01-03 DIAGNOSIS — I10 Essential (primary) hypertension: Secondary | ICD-10-CM | POA: Diagnosis not present

## 2017-01-03 DIAGNOSIS — S32019D Unspecified fracture of first lumbar vertebra, subsequent encounter for fracture with routine healing: Secondary | ICD-10-CM | POA: Diagnosis not present

## 2017-01-03 DIAGNOSIS — M9712XD Periprosthetic fracture around internal prosthetic left knee joint, subsequent encounter: Secondary | ICD-10-CM | POA: Diagnosis not present

## 2017-01-03 DIAGNOSIS — I82413 Acute embolism and thrombosis of femoral vein, bilateral: Secondary | ICD-10-CM | POA: Diagnosis not present

## 2017-01-04 DIAGNOSIS — S2220XD Unspecified fracture of sternum, subsequent encounter for fracture with routine healing: Secondary | ICD-10-CM | POA: Diagnosis not present

## 2017-01-04 DIAGNOSIS — I10 Essential (primary) hypertension: Secondary | ICD-10-CM | POA: Diagnosis not present

## 2017-01-04 DIAGNOSIS — S32019D Unspecified fracture of first lumbar vertebra, subsequent encounter for fracture with routine healing: Secondary | ICD-10-CM | POA: Diagnosis not present

## 2017-01-04 DIAGNOSIS — G8918 Other acute postprocedural pain: Secondary | ICD-10-CM | POA: Diagnosis not present

## 2017-01-04 DIAGNOSIS — S2243XD Multiple fractures of ribs, bilateral, subsequent encounter for fracture with routine healing: Secondary | ICD-10-CM | POA: Diagnosis not present

## 2017-01-04 DIAGNOSIS — E785 Hyperlipidemia, unspecified: Secondary | ICD-10-CM | POA: Diagnosis not present

## 2017-01-04 DIAGNOSIS — M9712XD Periprosthetic fracture around internal prosthetic left knee joint, subsequent encounter: Secondary | ICD-10-CM | POA: Diagnosis not present

## 2017-01-04 DIAGNOSIS — I82413 Acute embolism and thrombosis of femoral vein, bilateral: Secondary | ICD-10-CM | POA: Diagnosis not present

## 2017-01-04 DIAGNOSIS — K219 Gastro-esophageal reflux disease without esophagitis: Secondary | ICD-10-CM | POA: Diagnosis not present

## 2017-01-04 DIAGNOSIS — S32029D Unspecified fracture of second lumbar vertebra, subsequent encounter for fracture with routine healing: Secondary | ICD-10-CM | POA: Diagnosis not present

## 2017-01-04 DIAGNOSIS — S7292XD Unspecified fracture of left femur, subsequent encounter for closed fracture with routine healing: Secondary | ICD-10-CM | POA: Diagnosis not present

## 2017-01-04 DIAGNOSIS — S32039D Unspecified fracture of third lumbar vertebra, subsequent encounter for fracture with routine healing: Secondary | ICD-10-CM | POA: Diagnosis not present

## 2017-01-06 DIAGNOSIS — M9712XD Periprosthetic fracture around internal prosthetic left knee joint, subsequent encounter: Secondary | ICD-10-CM | POA: Diagnosis not present

## 2017-01-06 DIAGNOSIS — E785 Hyperlipidemia, unspecified: Secondary | ICD-10-CM | POA: Diagnosis not present

## 2017-01-06 DIAGNOSIS — S32039D Unspecified fracture of third lumbar vertebra, subsequent encounter for fracture with routine healing: Secondary | ICD-10-CM | POA: Diagnosis not present

## 2017-01-06 DIAGNOSIS — S72402D Unspecified fracture of lower end of left femur, subsequent encounter for closed fracture with routine healing: Secondary | ICD-10-CM | POA: Diagnosis not present

## 2017-01-06 DIAGNOSIS — S32029D Unspecified fracture of second lumbar vertebra, subsequent encounter for fracture with routine healing: Secondary | ICD-10-CM | POA: Diagnosis not present

## 2017-01-06 DIAGNOSIS — K219 Gastro-esophageal reflux disease without esophagitis: Secondary | ICD-10-CM | POA: Diagnosis not present

## 2017-01-06 DIAGNOSIS — S2220XD Unspecified fracture of sternum, subsequent encounter for fracture with routine healing: Secondary | ICD-10-CM | POA: Diagnosis not present

## 2017-01-06 DIAGNOSIS — G8918 Other acute postprocedural pain: Secondary | ICD-10-CM | POA: Diagnosis not present

## 2017-01-06 DIAGNOSIS — I10 Essential (primary) hypertension: Secondary | ICD-10-CM | POA: Diagnosis not present

## 2017-01-06 DIAGNOSIS — S2243XD Multiple fractures of ribs, bilateral, subsequent encounter for fracture with routine healing: Secondary | ICD-10-CM | POA: Diagnosis not present

## 2017-01-06 DIAGNOSIS — S32019D Unspecified fracture of first lumbar vertebra, subsequent encounter for fracture with routine healing: Secondary | ICD-10-CM | POA: Diagnosis not present

## 2017-01-06 DIAGNOSIS — I82413 Acute embolism and thrombosis of femoral vein, bilateral: Secondary | ICD-10-CM | POA: Diagnosis not present

## 2017-01-07 DIAGNOSIS — S2220XD Unspecified fracture of sternum, subsequent encounter for fracture with routine healing: Secondary | ICD-10-CM | POA: Diagnosis not present

## 2017-01-07 DIAGNOSIS — K219 Gastro-esophageal reflux disease without esophagitis: Secondary | ICD-10-CM | POA: Diagnosis not present

## 2017-01-07 DIAGNOSIS — S7292XD Unspecified fracture of left femur, subsequent encounter for closed fracture with routine healing: Secondary | ICD-10-CM | POA: Diagnosis not present

## 2017-01-07 DIAGNOSIS — I10 Essential (primary) hypertension: Secondary | ICD-10-CM | POA: Diagnosis not present

## 2017-01-07 DIAGNOSIS — M9712XD Periprosthetic fracture around internal prosthetic left knee joint, subsequent encounter: Secondary | ICD-10-CM | POA: Diagnosis not present

## 2017-01-07 DIAGNOSIS — G8918 Other acute postprocedural pain: Secondary | ICD-10-CM | POA: Diagnosis not present

## 2017-01-07 DIAGNOSIS — I82413 Acute embolism and thrombosis of femoral vein, bilateral: Secondary | ICD-10-CM | POA: Diagnosis not present

## 2017-01-07 DIAGNOSIS — E785 Hyperlipidemia, unspecified: Secondary | ICD-10-CM | POA: Diagnosis not present

## 2017-01-07 DIAGNOSIS — S32019D Unspecified fracture of first lumbar vertebra, subsequent encounter for fracture with routine healing: Secondary | ICD-10-CM | POA: Diagnosis not present

## 2017-01-07 DIAGNOSIS — S32029D Unspecified fracture of second lumbar vertebra, subsequent encounter for fracture with routine healing: Secondary | ICD-10-CM | POA: Diagnosis not present

## 2017-01-07 DIAGNOSIS — S2243XD Multiple fractures of ribs, bilateral, subsequent encounter for fracture with routine healing: Secondary | ICD-10-CM | POA: Diagnosis not present

## 2017-01-07 DIAGNOSIS — S32039D Unspecified fracture of third lumbar vertebra, subsequent encounter for fracture with routine healing: Secondary | ICD-10-CM | POA: Diagnosis not present

## 2017-01-08 DIAGNOSIS — K219 Gastro-esophageal reflux disease without esophagitis: Secondary | ICD-10-CM | POA: Diagnosis not present

## 2017-01-08 DIAGNOSIS — S2220XD Unspecified fracture of sternum, subsequent encounter for fracture with routine healing: Secondary | ICD-10-CM | POA: Diagnosis not present

## 2017-01-08 DIAGNOSIS — I10 Essential (primary) hypertension: Secondary | ICD-10-CM | POA: Diagnosis not present

## 2017-01-08 DIAGNOSIS — E785 Hyperlipidemia, unspecified: Secondary | ICD-10-CM | POA: Diagnosis not present

## 2017-01-08 DIAGNOSIS — I82413 Acute embolism and thrombosis of femoral vein, bilateral: Secondary | ICD-10-CM | POA: Diagnosis not present

## 2017-01-08 DIAGNOSIS — S32019D Unspecified fracture of first lumbar vertebra, subsequent encounter for fracture with routine healing: Secondary | ICD-10-CM | POA: Diagnosis not present

## 2017-01-08 DIAGNOSIS — S32039D Unspecified fracture of third lumbar vertebra, subsequent encounter for fracture with routine healing: Secondary | ICD-10-CM | POA: Diagnosis not present

## 2017-01-08 DIAGNOSIS — G8918 Other acute postprocedural pain: Secondary | ICD-10-CM | POA: Diagnosis not present

## 2017-01-08 DIAGNOSIS — S7292XD Unspecified fracture of left femur, subsequent encounter for closed fracture with routine healing: Secondary | ICD-10-CM | POA: Diagnosis not present

## 2017-01-08 DIAGNOSIS — M9712XD Periprosthetic fracture around internal prosthetic left knee joint, subsequent encounter: Secondary | ICD-10-CM | POA: Diagnosis not present

## 2017-01-08 DIAGNOSIS — S2243XD Multiple fractures of ribs, bilateral, subsequent encounter for fracture with routine healing: Secondary | ICD-10-CM | POA: Diagnosis not present

## 2017-01-08 DIAGNOSIS — S32029D Unspecified fracture of second lumbar vertebra, subsequent encounter for fracture with routine healing: Secondary | ICD-10-CM | POA: Diagnosis not present

## 2017-01-09 ENCOUNTER — Encounter: Payer: Medicare Other | Admitting: *Deleted

## 2017-01-10 ENCOUNTER — Ambulatory Visit (INDEPENDENT_AMBULATORY_CARE_PROVIDER_SITE_OTHER): Payer: Medicare Other | Admitting: *Deleted

## 2017-01-10 DIAGNOSIS — I824Y3 Acute embolism and thrombosis of unspecified deep veins of proximal lower extremity, bilateral: Secondary | ICD-10-CM | POA: Diagnosis not present

## 2017-01-10 DIAGNOSIS — I824Y9 Acute embolism and thrombosis of unspecified deep veins of unspecified proximal lower extremity: Secondary | ICD-10-CM | POA: Insufficient documentation

## 2017-01-10 LAB — COAGUCHEK XS/INR WAIVED
INR: 3.1 — AB (ref 0.9–1.1)
PROTHROMBIN TIME: 36.8 s

## 2017-01-10 NOTE — Patient Instructions (Signed)
Anticoagulation Warfarin Dose Instructions as of 01/10/2017      Dorene Grebe Tue Wed Thu Fri Sat   New Dose 7.5 mg 3.75 mg 7.5 mg 7.5 mg 3.75 mg 7.5 mg 7.5 mg    Description   Take both Lovenox injections today. You can stop it after today.   Take 1/2 of a pill on Monday and Thursday and take 1 whole pill all other days.   Return on 01/16/17
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
## 2017-01-16 ENCOUNTER — Encounter: Payer: Self-pay | Admitting: Family Medicine

## 2017-01-16 ENCOUNTER — Ambulatory Visit (INDEPENDENT_AMBULATORY_CARE_PROVIDER_SITE_OTHER): Payer: Medicare Other | Admitting: Family Medicine

## 2017-01-16 VITALS — BP 133/74 | HR 72 | Temp 98.0°F | Ht 68.0 in | Wt 200.0 lb

## 2017-01-16 DIAGNOSIS — I824Y3 Acute embolism and thrombosis of unspecified deep veins of proximal lower extremity, bilateral: Secondary | ICD-10-CM

## 2017-01-16 DIAGNOSIS — S2222XS Fracture of body of sternum, sequela: Secondary | ICD-10-CM

## 2017-01-16 DIAGNOSIS — S72332D Displaced oblique fracture of shaft of left femur, subsequent encounter for closed fracture with routine healing: Secondary | ICD-10-CM

## 2017-01-16 DIAGNOSIS — S2242XS Multiple fractures of ribs, left side, sequela: Secondary | ICD-10-CM

## 2017-01-16 LAB — COAGUCHEK XS/INR WAIVED
INR: 1.5 — ABNORMAL HIGH (ref 0.9–1.1)
PROTHROMBIN TIME: 17.7 s

## 2017-01-16 MED ORDER — HYDROCODONE-ACETAMINOPHEN 5-325 MG PO TABS: 1.0000 | ORAL_TABLET | Freq: Four times a day (QID) | ORAL | 0 refills | Status: DC | PRN

## 2017-01-16 MED ORDER — DICLOFENAC SODIUM 1 % TD GEL: 2.0000 g | Freq: Four times a day (QID) | TRANSDERMAL | 2 refills | Status: DC

## 2017-01-16 NOTE — Patient Instructions (Signed)
Anticoagulation Warfarin Dose Instructions as of 01/16/2017      Shawn Lambert Tue Wed Thu Fri Sat   New Dose 7.5 mg 11.25 mg 7.5 mg 7.5 mg 11.25 mg 7.5 mg 7.5 mg    Description   inr 1.5  Take 1/2 of a pill on Monday and Thursday and take 1 whole pill all other days.   Return on 01/16/17
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
## 2017-01-21 ENCOUNTER — Ambulatory Visit: Payer: Medicare Other | Admitting: Physical Therapy

## 2017-01-29 ENCOUNTER — Ambulatory Visit (INDEPENDENT_AMBULATORY_CARE_PROVIDER_SITE_OTHER): Payer: Medicare Other | Admitting: *Deleted

## 2017-01-29 ENCOUNTER — Ambulatory Visit (INDEPENDENT_AMBULATORY_CARE_PROVIDER_SITE_OTHER): Payer: Medicare Other | Admitting: Family Medicine

## 2017-01-29 ENCOUNTER — Encounter: Payer: Self-pay | Admitting: Family Medicine

## 2017-01-29 DIAGNOSIS — S72332D Displaced oblique fracture of shaft of left femur, subsequent encounter for closed fracture with routine healing: Secondary | ICD-10-CM | POA: Diagnosis not present

## 2017-01-29 DIAGNOSIS — S2222XS Fracture of body of sternum, sequela: Secondary | ICD-10-CM | POA: Diagnosis not present

## 2017-01-29 DIAGNOSIS — S2242XS Multiple fractures of ribs, left side, sequela: Secondary | ICD-10-CM | POA: Diagnosis not present

## 2017-01-29 DIAGNOSIS — I824Y3 Acute embolism and thrombosis of unspecified deep veins of proximal lower extremity, bilateral: Secondary | ICD-10-CM

## 2017-01-29 LAB — COAGUCHEK XS/INR WAIVED
INR: 4.8 — AB (ref 0.9–1.1)
Prothrombin Time: 57.8 s

## 2017-01-29 MED ORDER — HYDROCODONE-ACETAMINOPHEN 5-325 MG PO TABS: 1.0000 | ORAL_TABLET | Freq: Three times a day (TID) | ORAL | 0 refills | Status: DC | PRN

## 2017-01-29 MED ORDER — WARFARIN SODIUM 7.5 MG PO TABS: 7.5000 mg | ORAL_TABLET | Freq: Every day | ORAL | 0 refills | Status: DC

## 2017-01-29 NOTE — Progress Notes (Signed)
Subjective:     Indication: DVT Bleeding signs/symptoms: None Thromboembolic signs/symptoms: None  Missed Coumadin doses: None Medication changes: no Dietary changes: no Bacterial/viral infection: no Other concerns: no  The following portions of the patient's history were reviewed and updated as appropriate: allergies and current medications.  Review of Systems Pertinent items are noted in HPI.   Objective:    INR Today: 4.8 Current dose: 11.25 mg on Tues and Thurs and 7.5mg  all other days.     Assessment:    Supratherapeutic INR for goal of 2-3   Plan:    1. New dose: 3.75 mg(1/2 tab) on Tues and Thurs and 7.5 mg (1 tab) all other days 2. Next INR: 2 weeks    Discussed with Dr Dettinger  Chong Sicilian, RN
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
## 2017-01-31 ENCOUNTER — Other Ambulatory Visit: Payer: Self-pay | Admitting: *Deleted

## 2017-01-31 DIAGNOSIS — M19011 Primary osteoarthritis, right shoulder: Secondary | ICD-10-CM

## 2017-01-31 MED ORDER — GABAPENTIN 300 MG PO CAPS: 300.0000 mg | ORAL_CAPSULE | Freq: Two times a day (BID) | ORAL | 0 refills | Status: DC

## 2017-02-12 ENCOUNTER — Ambulatory Visit (INDEPENDENT_AMBULATORY_CARE_PROVIDER_SITE_OTHER): Payer: Medicare Other | Admitting: *Deleted

## 2017-02-12 DIAGNOSIS — I824Y3 Acute embolism and thrombosis of unspecified deep veins of proximal lower extremity, bilateral: Secondary | ICD-10-CM

## 2017-02-12 LAB — COAGUCHEK XS/INR WAIVED
INR: 2.7 — AB (ref 0.9–1.1)
PROTHROMBIN TIME: 32.8 s

## 2017-02-13 NOTE — Progress Notes (Signed)
Subjective:     Indication: DVT Bleeding signs/symptoms: None Thromboembolic signs/symptoms: None  Missed Coumadin doses: None Medication changes: no Dietary changes: no Bacterial/viral infection: no Other concerns: no  The following portions of the patient's history were reviewed and updated as appropriate: allergies and current medications.  Review of Systems Pertinent items are noted in HPI.   Objective:    INR Today: 2.7 Current dose: 3.75 on Tues and Thurs and 7.5 all other days    Assessment:    Therapeutic INR for goal of 2-3   Plan:    1. New dose: no change   2. Next INR: 3 weeks    Chong Sicilian, RN

## 2017-02-21 DIAGNOSIS — M9712XD Periprosthetic fracture around internal prosthetic left knee joint, subsequent encounter: Secondary | ICD-10-CM | POA: Diagnosis not present

## 2017-02-21 DIAGNOSIS — Z96652 Presence of left artificial knee joint: Secondary | ICD-10-CM | POA: Diagnosis not present

## 2017-02-26 ENCOUNTER — Encounter: Payer: Medicare Other | Admitting: *Deleted

## 2017-03-05 ENCOUNTER — Encounter: Payer: Medicare Other | Admitting: *Deleted

## 2017-03-05 ENCOUNTER — Ambulatory Visit (INDEPENDENT_AMBULATORY_CARE_PROVIDER_SITE_OTHER): Payer: Medicare Other | Admitting: Physician Assistant

## 2017-03-05 ENCOUNTER — Encounter: Payer: Self-pay | Admitting: Physician Assistant

## 2017-03-05 ENCOUNTER — Other Ambulatory Visit: Payer: Self-pay | Admitting: Physician Assistant

## 2017-03-05 DIAGNOSIS — I824Y3 Acute embolism and thrombosis of unspecified deep veins of proximal lower extremity, bilateral: Secondary | ICD-10-CM | POA: Diagnosis not present

## 2017-03-05 LAB — COAGUCHEK XS/INR WAIVED
INR: 2.2 — AB (ref 0.9–1.1)
Prothrombin Time: 26.1 s

## 2017-03-05 NOTE — Patient Instructions (Signed)
Description   Continue current dose 3.75mg  every Tue, Thurs; 7.5mg  all other days Recheck protime 4 weeks

## 2017-03-05 NOTE — Progress Notes (Signed)
Patient aware.

## 2017-03-22 ENCOUNTER — Ambulatory Visit (INDEPENDENT_AMBULATORY_CARE_PROVIDER_SITE_OTHER): Payer: Medicare Other | Admitting: Family Medicine

## 2017-03-22 ENCOUNTER — Encounter: Payer: Self-pay | Admitting: Family Medicine

## 2017-03-22 VITALS — BP 131/80 | HR 84 | Temp 98.0°F | Ht 68.0 in | Wt 204.0 lb

## 2017-03-22 DIAGNOSIS — M899 Disorder of bone, unspecified: Secondary | ICD-10-CM | POA: Diagnosis not present

## 2017-03-22 DIAGNOSIS — R5383 Other fatigue: Secondary | ICD-10-CM | POA: Diagnosis not present

## 2017-03-22 DIAGNOSIS — M25511 Pain in right shoulder: Secondary | ICD-10-CM

## 2017-03-22 DIAGNOSIS — R6889 Other general symptoms and signs: Secondary | ICD-10-CM | POA: Diagnosis not present

## 2017-03-22 DIAGNOSIS — M19011 Primary osteoarthritis, right shoulder: Secondary | ICD-10-CM | POA: Diagnosis not present

## 2017-03-22 MED ORDER — GABAPENTIN 300 MG PO CAPS: 300.0000 mg | ORAL_CAPSULE | Freq: Two times a day (BID) | ORAL | 0 refills | Status: DC

## 2017-03-22 MED ORDER — METHYLPREDNISOLONE ACETATE 80 MG/ML IJ SUSP
80.0000 mg | Freq: Once | INTRAMUSCULAR | Status: AC
  Administered 2017-03-22: 80 mg via INTRAMUSCULAR
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
## 2017-03-23 LAB — CMP14+EGFR
ALT: 19 IU/L (ref 0–44)
AST: 20 IU/L (ref 0–40)
Albumin/Globulin Ratio: 1.5 (ref 1.2–2.2)
Albumin: 4.4 g/dL (ref 3.5–4.8)
Alkaline Phosphatase: 188 IU/L — ABNORMAL HIGH (ref 39–117)
BILIRUBIN TOTAL: 0.3 mg/dL (ref 0.0–1.2)
BUN/Creatinine Ratio: 19 (ref 10–24)
BUN: 24 mg/dL (ref 8–27)
CALCIUM: 9.4 mg/dL (ref 8.6–10.2)
CHLORIDE: 105 mmol/L (ref 96–106)
CO2: 22 mmol/L (ref 20–29)
Creatinine, Ser: 1.28 mg/dL — ABNORMAL HIGH (ref 0.76–1.27)
GFR calc Af Amer: 62 mL/min/{1.73_m2} (ref >59)
GFR calc non Af Amer: 54 mL/min/{1.73_m2} — ABNORMAL LOW (ref >59)
GLUCOSE: 131 mg/dL — AB (ref 65–99)
Globulin, Total: 2.9 g/dL (ref 1.5–4.5)
POTASSIUM: 4.7 mmol/L (ref 3.5–5.2)
Sodium: 142 mmol/L (ref 134–144)
Total Protein: 7.3 g/dL (ref 6.0–8.5)

## 2017-03-23 LAB — CBC WITH DIFFERENTIAL/PLATELET
BASOS ABS: 0 10*3/uL (ref 0.0–0.2)
BASOS: 0 %
EOS (ABSOLUTE): 0.1 10*3/uL (ref 0.0–0.4)
Eos: 3 %
Hematocrit: 37 % — ABNORMAL LOW (ref 37.5–51.0)
Hemoglobin: 11.7 g/dL — ABNORMAL LOW (ref 13.0–17.7)
IMMATURE GRANULOCYTES: 0 %
Immature Grans (Abs): 0 10*3/uL (ref 0.0–0.1)
Lymphocytes Absolute: 0.8 10*3/uL (ref 0.7–3.1)
Lymphs: 24 %
MCH: 28.1 pg (ref 26.6–33.0)
MCHC: 31.6 g/dL (ref 31.5–35.7)
MCV: 89 fL (ref 79–97)
MONOS ABS: 0.6 10*3/uL (ref 0.1–0.9)
Monocytes: 17 %
Neutrophils Absolute: 1.9 10*3/uL (ref 1.4–7.0)
Neutrophils: 56 %
PLATELETS: 249 10*3/uL (ref 150–379)
RBC: 4.16 x10E6/uL (ref 4.14–5.80)
RDW: 15 % (ref 12.3–15.4)
WBC: 3.3 10*3/uL — ABNORMAL LOW (ref 3.4–10.8)

## 2017-03-23 LAB — VITAMIN D 25 HYDROXY (VIT D DEFICIENCY, FRACTURES): VIT D 25 HYDROXY: 57.9 ng/mL (ref 30.0–100.0)

## 2017-03-23 LAB — TSH: TSH: 2.27 u[IU]/mL (ref 0.450–4.500)

## 2017-03-23 LAB — VITAMIN B12: Vitamin B-12: 711 pg/mL (ref 232–1245)

## 2017-04-01 ENCOUNTER — Encounter: Payer: Medicare Other | Admitting: *Deleted

## 2017-04-02 ENCOUNTER — Encounter: Payer: Self-pay | Admitting: Family Medicine

## 2017-04-04 DIAGNOSIS — Z96651 Presence of right artificial knee joint: Secondary | ICD-10-CM | POA: Diagnosis not present

## 2017-04-04 DIAGNOSIS — M9712XD Periprosthetic fracture around internal prosthetic left knee joint, subsequent encounter: Secondary | ICD-10-CM | POA: Diagnosis not present

## 2017-04-04 DIAGNOSIS — M9711XD Periprosthetic fracture around internal prosthetic right knee joint, subsequent encounter: Secondary | ICD-10-CM | POA: Diagnosis not present

## 2017-04-05 ENCOUNTER — Ambulatory Visit (INDEPENDENT_AMBULATORY_CARE_PROVIDER_SITE_OTHER): Payer: Medicare Other | Admitting: Family Medicine

## 2017-04-05 ENCOUNTER — Encounter: Payer: Self-pay | Admitting: Family Medicine

## 2017-04-05 VITALS — BP 134/81 | HR 61 | Temp 98.0°F | Ht 68.0 in | Wt 203.0 lb

## 2017-04-05 DIAGNOSIS — M25511 Pain in right shoulder: Secondary | ICD-10-CM | POA: Diagnosis not present

## 2017-04-05 DIAGNOSIS — I824Y3 Acute embolism and thrombosis of unspecified deep veins of proximal lower extremity, bilateral: Secondary | ICD-10-CM

## 2017-04-05 DIAGNOSIS — R5383 Other fatigue: Secondary | ICD-10-CM | POA: Diagnosis not present
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
## 2017-04-05 NOTE — Patient Instructions (Signed)
Description   Continue current dose 3.75mg  every Tue, Thurs; 7.5mg  all other days Recheck protime 4 weeks  INR 2.0

## 2017-04-06 ENCOUNTER — Other Ambulatory Visit: Payer: Medicare Other

## 2017-04-06 DIAGNOSIS — I824Y3 Acute embolism and thrombosis of unspecified deep veins of proximal lower extremity, bilateral: Secondary | ICD-10-CM

## 2017-04-06 DIAGNOSIS — R5383 Other fatigue: Secondary | ICD-10-CM | POA: Diagnosis not present

## 2017-04-07 LAB — TESTOSTERONE,FREE AND TOTAL
TESTOSTERONE FREE: 6.9 pg/mL (ref 6.6–18.1)
Testosterone: 378 ng/dL (ref 264–916)

## 2017-04-08 DIAGNOSIS — I824Y3 Acute embolism and thrombosis of unspecified deep veins of proximal lower extremity, bilateral: Secondary | ICD-10-CM | POA: Diagnosis not present

## 2017-04-08 LAB — COAGUCHEK XS/INR WAIVED
INR: 2 — ABNORMAL HIGH (ref 0.9–1.1)
Prothrombin Time: 23.4 s

## 2017-04-08 NOTE — Addendum Note (Signed)
Addended by: Liliane Bade on: 04/08/2017 09:32 AM   Modules accepted: Orders

## 2017-05-01 ENCOUNTER — Other Ambulatory Visit: Payer: Self-pay | Admitting: Family Medicine

## 2017-05-06 ENCOUNTER — Ambulatory Visit (INDEPENDENT_AMBULATORY_CARE_PROVIDER_SITE_OTHER): Payer: Medicare Other | Admitting: Family Medicine

## 2017-05-06 ENCOUNTER — Encounter: Payer: Self-pay | Admitting: Family Medicine

## 2017-05-06 ENCOUNTER — Encounter: Payer: Medicare Other | Admitting: *Deleted

## 2017-05-06 VITALS — BP 139/81 | HR 77 | Temp 96.4°F | Ht 68.0 in | Wt 207.0 lb

## 2017-05-06 DIAGNOSIS — H6123 Impacted cerumen, bilateral: Secondary | ICD-10-CM

## 2017-05-06 DIAGNOSIS — M25511 Pain in right shoulder: Secondary | ICD-10-CM

## 2017-05-06 DIAGNOSIS — I824Y3 Acute embolism and thrombosis of unspecified deep veins of proximal lower extremity, bilateral: Secondary | ICD-10-CM | POA: Diagnosis not present

## 2017-05-06 LAB — COAGUCHEK XS/INR WAIVED
INR: 2.7 — ABNORMAL HIGH (ref 0.9–1.1)
Prothrombin Time: 32 s

## 2017-05-06 MED ORDER — CYCLOBENZAPRINE HCL 10 MG PO TABS: 10.0000 mg | ORAL_TABLET | Freq: Three times a day (TID) | ORAL | 1 refills | Status: DC | PRN

## 2017-05-06 MED ORDER — BETAMETHASONE SOD PHOS & ACET 6 (3-3) MG/ML IJ SUSP
12.0000 mg | Freq: Once | INTRAMUSCULAR | Status: AC
  Administered 2017-05-06: 12 mg via INTRAMUSCULAR

## 2017-05-06 MED ORDER — BETAMETHASONE SOD PHOS & ACET 30 MG/5ML IJ KIT: 2.0000 mL | PACK | Freq: Once | INTRAMUSCULAR | Status: DC

## 2017-05-06 NOTE — Progress Notes (Addendum)
BP 139/81   Pulse 77   Temp (!) 96.4 F (35.8 C) (Oral)   Ht '5\' 8"'  (1.727 m)   Wt 207 lb (93.9 kg)   BMI 31.47 kg/m    Subjective:    Patient ID: Shawn Lambert, male    DOB: 01/23/1941, 77 y.o.   MRN: [id number]  HPI: Shawn Lambert is a 77 y.o. male presenting on 05/06/2017 for Coagulation Disorder (Patient states that he would like a right shoulder injection.)   HPI DVT and INR follow-up She was coming in today for a DVT and INR follow-up.  He has been taking the Coumadin since he had a DVT after an accident he had with a bull.  Continued right shoulder pain [name] coming in for continued right shoulder pain since the accident he had.  He has had an injection there previously that has helped from his orthopedic and that he had one here that he said did not help as much and would like to try the same injection that he had previously.  He says the right shoulder hurts more with overhead range of motion and reaching.  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  He denies any radiation or numbness from that shoulder going anywhere.  The pain hurts on the lateral aspect of the shoulder.  He denies any swelling or redness or warmth.  Wax and muffled hearing in both ears Patient complains of muffled ears and wax in his ears.  He denies any drainage or fever or chills or pain in his ears but just mainly feels like he cannot hear as well and would like to have his ears cleaned today.  Relevant past medical, surgical, family and social history reviewed and updated as indicated. Interim medical history since our last visit reviewed. Allergies and medications reviewed and updated.  Review of Systems  Constitutional: Negative for chills and fever.  HENT: Positive for hearing loss. Negative for congestion, ear discharge, ear pain, rhinorrhea, sinus pressure and sinus pain.   Respiratory: Negative for cough, shortness of breath and wheezing.    Cardiovascular: Negative for chest pain and leg swelling.  Musculoskeletal: Positive for arthralgias. Negative for back pain, gait problem and joint swelling.  Skin: Negative for rash.  All other systems reviewed and are negative.   Per HPI unless specifically indicated above   Allergies as of 05/06/2017      Reactions   Penicillins Itching   Has patient had a PCN reaction causing immediate rash, facial/tongue/throat swelling, SOB or lightheadedness with hypotension: no Has patient had a PCN reaction causing severe rash involving mucus membranes or skin necrosis: no Has patient had a PCN reaction that required hospitalization no Has patient had a PCN reaction occurring within the last 10 years: no If all of the above answers are "NO", then may proceed with Cephalosporin use.   Sulfonamide Derivatives Other (See Comments)   Reaction not known      Medication List        Accurate as of 05/06/17 11:19 AM. Always use your most recent med list.          acetaminophen 325 MG tablet Commonly known as:  TYLENOL Take 650 mg by mouth every 6 (six) hours as needed.   amLODipine 5 MG tablet Commonly known as:  NORVASC Take 1 tablet (5 mg total) by mouth daily.   atorvastatin 20 MG tablet Commonly known as:  LIPITOR Take 1 tablet (20 mg total) by mouth daily.   Calcium-Vitamin D 600-200 MG-UNIT Caps Take 2 tablets by mouth daily. Reported on 08/01/2015   cholecalciferol 1000 units tablet Commonly known as:  VITAMIN D Take 1,000 Units by mouth daily. Reported on 08/01/2015   [employer] SPECTRAVITE ADULT 50+ PO Take 1 tablet by mouth daily. Reported on 08/01/2015   diclofenac sodium 1 % Gel Commonly known as:  VOLTAREN Apply 2 g topically 4 (four) times daily.   gabapentin 300 MG capsule Commonly known as:  NEURONTIN Take 1 capsule (300 mg total) by mouth 2 (two) times daily.   Glucosamine-Chondroitin-MSM 701-779-390 MG Tabs Take by mouth.   omeprazole 40 MG capsule Commonly  known as:  PRILOSEC Take 1 capsule (40 mg total) by mouth daily.   warfarin 7.5 MG tablet Commonly known as:  COUMADIN Take as directed by the anticoagulation clinic. If you are unsure how to take this medication, talk to your nurse or doctor. Original instructions:  TAKE 1 TABLET (7.5 MG TOTAL) DAILY BY MOUTH.          Objective:    BP 139/81   Pulse 77   Temp (!) 96.4 F (35.8 C) (Oral)   Ht '5\' 8"'  (1.727 m)   Wt 207 lb (93.9 kg)   BMI 31.47 kg/m   Wt Readings from Last 3 Encounters:  05/06/17 207 lb (93.9 kg)  04/05/17 203 lb (92.1 kg)  03/22/17 204 lb (92.5 kg)    Physical Exam  Constitutional: He is oriented to person, place, and time. He appears well-developed and well-nourished. No distress.  HENT:  Bilateral cerumen impaction  Eyes: Conjunctivae are normal. No scleral icterus.  Cardiovascular: Normal rate, regular rhythm, normal heart sounds and intact distal pulses.  No murmur heard. Pulmonary/Chest: Effort normal and breath sounds normal. No respiratory distress. He has no wheezes. He has no rales.  Musculoskeletal: Normal range of motion. He exhibits no edema.  Neurological: He is alert and oriented to person, place, and time. Coordination normal.  Skin: Skin is warm and dry. No rash noted. He is not diaphoretic.  Psychiatric: He has a normal mood and affect. His behavior is normal.  Nursing note and vitals reviewed.   Shoulder injection: Consent form signed. Risk factors of bleeding and infection discussed with patient and patient is agreeable towards injection. Patient prepped with Betadine. Lateral approach towards injection used. Injected 2 mL of Celestone 30 per 5 mils and 1 mL of 1% lidocaine and 2 mL of 0.5% bupivacaine. Patient tolerated procedure well and no side effects from noted. Minimal to no bleeding. Simple bandage applied after.   Cerumen lavage by nurse, patient tolerated well and clear afterwards.    Assessment & Plan:   Problem List Items  Addressed This Visit      Cardiovascular and Mediastinum   Acute venous embolism and thrombosis of deep vessels of proximal lower extremity (HCC) [I82.4Y9]    Other Visit Diagnoses    Acute pain of right shoulder    -  Primary   Relevant Medications   Betamethasone Sod Phos & Acet 30 MG/5ML KIT 2 mL   cyclobenzaprine (FLEXERIL) 10 MG tablet   betamethasone acetate-betamethasone sodium phosphate (CELESTONE) injection 12 mg (Completed)   Bilateral impacted cerumen           Follow up plan: Return if symptoms worsen or fail to improve.  Counseling provided for all of the vaccine components No orders of the defined types were placed  in this encounter.   Caryl Pina, MD [facility] 05/06/2017, 11:19 AM

## 2017-05-06 NOTE — Patient Instructions (Signed)
Description   Continue current dose 3.75mg  every Tue, Thurs; 7.5mg  all other days Recheck protime 4 weeks  INR 2.7 and patient is having gum bleeding again, will adjust down dosing

## 2017-05-25 ENCOUNTER — Other Ambulatory Visit: Payer: Self-pay | Admitting: Family Medicine

## 2017-05-25 DIAGNOSIS — I119 Hypertensive heart disease without heart failure: Secondary | ICD-10-CM

## 2017-06-03 ENCOUNTER — Encounter: Payer: Self-pay | Admitting: Family Medicine

## 2017-06-03 ENCOUNTER — Ambulatory Visit (INDEPENDENT_AMBULATORY_CARE_PROVIDER_SITE_OTHER): Payer: Medicare Other | Admitting: Family Medicine

## 2017-06-03 VITALS — BP 137/77 | HR 71 | Temp 98.0°F | Ht 68.0 in | Wt 210.0 lb

## 2017-06-03 DIAGNOSIS — I824Y3 Acute embolism and thrombosis of unspecified deep veins of proximal lower extremity, bilateral: Secondary | ICD-10-CM

## 2017-06-03 LAB — COAGUCHEK XS/INR WAIVED
INR: 3.5 — ABNORMAL HIGH (ref 0.9–1.1)
Prothrombin Time: 42.2 s

## 2017-06-03 NOTE — Progress Notes (Signed)
BP 137/77   Pulse 71   Temp 98 F (36.7 C) (Oral)   Ht 5\' 8"  (1.727 m)   Wt 210 lb (95.3 kg)   BMI 31.93 kg/m    Subjective:    Patient ID: Shawn Lambert, male    DOB: December 05, 1940, 77 y.o.   MRN: [id number]  HPI: Shawn Lambert is a 77 y.o. male presenting on 06/03/2017 for Coagulation Disorder and Right shoulder pain (followup)   HPI Coumadin recheck for DVT Patient is coming in for Coumadin recheck for DVT today.  He says he still having bleeding in his gums about the numbers will be better after we lowered last time but he is even more increased from last time and his INR is 3.5.  He denies any changes in diet or other lifestyle changes and does not understand why it would be higher.  He denies any blood in his stool or blood in his urine or abnormal bruising or bleeding that he is not been able to stop.  Relevant past medical, surgical, family and social history reviewed and updated as indicated. Interim medical history since our last visit reviewed. Allergies and medications reviewed and updated.  Review of Systems  Constitutional: Negative for chills and fever.  HENT: Positive for dental problem (Gum bleeding). Negative for nosebleeds.   Respiratory: Negative for shortness of breath and wheezing.   Cardiovascular: Negative for chest pain and leg swelling.  Musculoskeletal: Negative for back pain and gait problem.  Skin: Negative for rash.  All other systems reviewed and are negative.   Per HPI unless specifically indicated above   Allergies as of 06/03/2017      Reactions   Penicillins Itching   Has patient had a PCN reaction causing immediate rash, facial/tongue/throat swelling, SOB or lightheadedness with hypotension: no Has patient had a PCN reaction causing severe rash involving mucus membranes or skin necrosis: no Has patient had a PCN reaction that required hospitalization no Has patient had a PCN reaction occurring within the last 10 years: no If all of the  above answers are "NO", then may proceed with Cephalosporin use.   Sulfonamide Derivatives Other (See Comments)   Reaction not known      Medication List        Accurate as of 06/03/17 10:54 AM. Always use your most recent med list.          acetaminophen 325 MG tablet Commonly known as:  TYLENOL Take 650 mg by mouth every 6 (six) hours as needed.   amLODipine 5 MG tablet Commonly known as:  NORVASC TAKE 1 TABLET DAILY   atorvastatin 20 MG tablet Commonly known as:  LIPITOR TAKE 1 TABLET DAILY   Calcium-Vitamin D 600-200 MG-UNIT Caps Take 2 tablets by mouth daily. Reported on 08/01/2015   cholecalciferol 1000 units tablet Commonly known as:  VITAMIN D Take 1,000 Units by mouth daily. Reported on 08/01/2015   [employer] SPECTRAVITE ADULT 50+ PO Take 1 tablet by mouth daily. Reported on 08/01/2015   cyclobenzaprine 10 MG tablet Commonly known as:  FLEXERIL Take 1 tablet (10 mg total) by mouth 3 (three) times daily as needed for muscle spasms.   diclofenac sodium 1 % Gel Commonly known as:  VOLTAREN Apply 2 g topically 4 (four) times daily.   gabapentin 300 MG capsule Commonly known as:  NEURONTIN Take 1 capsule (300 mg total) by mouth 2 (two) times daily.   Glucosamine-Chondroitin-MSM 573-220-254 MG Tabs Take by mouth.   omeprazole  40 MG capsule Commonly known as:  PRILOSEC Take 1 capsule (40 mg total) by mouth daily.   warfarin 7.5 MG tablet Commonly known as:  COUMADIN Take as directed by the anticoagulation clinic. If you are unsure how to take this medication, talk to your nurse or doctor. Original instructions:  TAKE 1 TABLET (7.5 MG TOTAL) DAILY BY MOUTH.          Objective:    BP 137/77   Pulse 71   Temp 98 F (36.7 C) (Oral)   Ht 5\' 8"  (1.727 m)   Wt 210 lb (95.3 kg)   BMI 31.93 kg/m   Wt Readings from Last 3 Encounters:  06/03/17 210 lb (95.3 kg)  05/06/17 207 lb (93.9 kg)  04/05/17 203 lb (92.1 kg)    Physical Exam  Constitutional: He is  oriented to person, place, and time. He appears well-developed and well-nourished. No distress.  Eyes: Conjunctivae are normal. No scleral icterus.  Cardiovascular: Normal rate, regular rhythm, normal heart sounds and intact distal pulses.  No murmur heard. Pulmonary/Chest: Effort normal and breath sounds normal. No respiratory distress. He has no wheezes. He has no rales.  Neurological: He is alert and oriented to person, place, and time. Coordination normal.  Skin: Skin is warm and dry. No rash noted. He is not diaphoretic.  Psychiatric: He has a normal mood and affect. His behavior is normal.  Nursing note and vitals reviewed.   Description   Continue current dose 3.75mg  every Tue, Thurs; 7.5mg  all other days Recheck protime 4 weeks  INR 3.5 and patient is having gum bleeding again, will adjust down dosing     Patient having shoulder pain today, does not think the injection worked last time, found the note from his orthopedic that said that he got 1 cc of 40 mg Kenalog and 3 cc of lidocaine and Marcaine each and said that this helped him a lot more.    Assessment & Plan:   Problem List Items Addressed This Visit      Cardiovascular and Mediastinum   Acute venous embolism and thrombosis of deep vessels of proximal lower extremity ([place]) [I82.4Y9] - Primary   Relevant Orders   CoaguChek XS/INR Waived      INR 3.5, will adjust down dosing again Follow up plan: Return if symptoms worsen or fail to improve.  Counseling provided for all of the vaccine components Orders Placed This Encounter  Procedures  . CoaguChek XS/INR [place], MD [facility] 06/03/2017, 10:54 AM

## 2017-06-16 ENCOUNTER — Other Ambulatory Visit: Payer: Self-pay | Admitting: Family Medicine

## 2017-06-16 DIAGNOSIS — M25511 Pain in right shoulder: Secondary | ICD-10-CM

## 2017-06-24 ENCOUNTER — Ambulatory Visit (INDEPENDENT_AMBULATORY_CARE_PROVIDER_SITE_OTHER): Payer: Medicare Other | Admitting: Family Medicine

## 2017-06-24 ENCOUNTER — Telehealth: Payer: Self-pay | Admitting: Family Medicine

## 2017-06-24 ENCOUNTER — Ambulatory Visit (HOSPITAL_COMMUNITY)
Admission: RE | Admit: 2017-06-24 | Discharge: 2017-06-24 | Disposition: A | Discharge status: Home / Self Care | Payer: Medicare Other | Source: Ambulatory Visit | Attending: Family Medicine | Admitting: Family Medicine

## 2017-06-24 ENCOUNTER — Encounter: Payer: Self-pay | Admitting: Family Medicine

## 2017-06-24 VITALS — BP 133/87 | HR 84 | Temp 97.0°F | Ht 68.0 in | Wt 211.0 lb

## 2017-06-24 DIAGNOSIS — R600 Localized edema: Secondary | ICD-10-CM | POA: Insufficient documentation

## 2017-06-24 DIAGNOSIS — M7989 Other specified soft tissue disorders: Secondary | ICD-10-CM | POA: Insufficient documentation

## 2017-06-24 NOTE — Progress Notes (Signed)
BP 133/87   Pulse 84   Temp (!) 97 F (36.1 C) (Oral)   Ht 5\' 8"  (1.727 m)   Wt 211 lb (95.7 kg)   BMI 32.08 kg/m    Subjective:    Patient ID: Shawn Lambert, male    DOB: 02-03-1941, 77 y.o.   MRN: [id number]  HPI: Shawn Lambert is a 77 y.o. male presenting on 06/24/2017 for Swelling in legs and feet   HPI Acute swelling Patient has a recent history of DVT last year and over the weekend over the past 2 days he has had a significant increase in swelling in his left lower extremity and a minor increase in swelling in his right lower extremity.  The swelling in his left leg is about double what his leg was before and the right leg is just a small amount.  He is also had some splotchy red rash on his lower leg along with the swelling.  He has one small patch of similar on his right leg.  Is not pruritic or raised or painful but he just noticed it around the same time.  He has not had any chest pain or shortness of breath or wheezing.  Relevant past medical, surgical, family and social history reviewed and updated as indicated. Interim medical history since our last visit reviewed. Allergies and medications reviewed and updated.  Review of Systems  Constitutional: Negative for chills and fever.  Respiratory: Negative for shortness of breath and wheezing.   Cardiovascular: Positive for leg swelling. Negative for chest pain.  Musculoskeletal: Negative for back pain and gait problem.  Skin: Negative for rash.  All other systems reviewed and are negative.   Per HPI unless specifically indicated above   Allergies as of 06/24/2017      Reactions   Penicillins Itching   Has patient had a PCN reaction causing immediate rash, facial/tongue/throat swelling, SOB or lightheadedness with hypotension: no Has patient had a PCN reaction causing severe rash involving mucus membranes or skin necrosis: no Has patient had a PCN reaction that required hospitalization no Has patient had a PCN  reaction occurring within the last 10 years: no If all of the above answers are "NO", then may proceed with Cephalosporin use.   Sulfonamide Derivatives Other (See Comments)   Reaction not known      Medication List        Accurate as of 06/24/17 12:10 PM. Always use your most recent med list.          acetaminophen 325 MG tablet Commonly known as:  TYLENOL Take 650 mg by mouth every 6 (six) hours as needed.   amLODipine 5 MG tablet Commonly known as:  NORVASC TAKE 1 TABLET DAILY   atorvastatin 20 MG tablet Commonly known as:  LIPITOR TAKE 1 TABLET DAILY   Calcium-Vitamin D 600-200 MG-UNIT Caps Take 2 tablets by mouth daily. Reported on 08/01/2015   cholecalciferol 1000 units tablet Commonly known as:  VITAMIN D Take 1,000 Units by mouth daily. Reported on 08/01/2015   [employer] SPECTRAVITE ADULT 50+ PO Take 1 tablet by mouth daily. Reported on 08/01/2015   cyclobenzaprine 10 MG tablet Commonly known as:  FLEXERIL TAKE 1 TABLET BY MOUTH THREE TIMES A DAY AS NEEDED FOR MUSCLE SPASMS   diclofenac sodium 1 % Gel Commonly known as:  VOLTAREN Apply 2 g topically 4 (four) times daily.   gabapentin 300 MG capsule Commonly known as:  NEURONTIN Take 1 capsule (300 mg total)  by mouth 2 (two) times daily.   Glucosamine-Chondroitin-MSM [id number] MG Tabs Take by mouth.   omeprazole 40 MG capsule Commonly known as:  PRILOSEC Take 1 capsule (40 mg total) by mouth daily.   warfarin 7.5 MG tablet Commonly known as:  COUMADIN Take as directed by the anticoagulation clinic. If you are unsure how to take this medication, talk to your nurse or doctor. Original instructions:  TAKE 1 TABLET (7.5 MG TOTAL) DAILY BY MOUTH.          Objective:    BP 133/87   Pulse 84   Temp (!) 97 F (36.1 C) (Oral)   Ht 5\' 8"  (1.727 m)   Wt 211 lb (95.7 kg)   BMI 32.08 kg/m   Wt Readings from Last 3 Encounters:  06/24/17 211 lb (95.7 kg)  06/03/17 210 lb (95.3 kg)  05/06/17 207 lb (93.9 kg)     Physical Exam  Constitutional: He is oriented to person, place, and time. He appears well-developed and well-nourished. No distress.  Eyes: Conjunctivae are normal. No scleral icterus.  Cardiovascular: Normal rate, regular rhythm, normal heart sounds and intact distal pulses.  No murmur heard. Pulmonary/Chest: Effort normal and breath sounds normal. No respiratory distress. He has no wheezes. He has no rales.  Musculoskeletal: Normal range of motion. He exhibits edema (3+ pitting edema in left lower extremity, 1+ in right lower extremity).  Neurological: He is alert and oriented to person, place, and time. Coordination normal.  Skin: Skin is warm and dry. Rash (Petechiae in splotches on left lower extremity and one small area in right lower extremity) noted. He is not diaphoretic.  Psychiatric: He has a normal mood and affect. His behavior is normal.  Nursing note and vitals reviewed.       Assessment & Plan:   Problem List Items Addressed This Visit    None    Visit Diagnoses    Left leg swelling    -  Primary   On Coumadin, recent DVTs last year from incident   Relevant Orders   US Venous Img Lower Bilateral   Right leg swelling       Relevant Orders   US Venous Img Lower Bilateral       Follow up plan: No follow-ups on file.  Counseling provided for all of the vaccine components Orders Placed This Encounter  Procedures  . US Venous Img Lower Bilateral    Caryl Pina, MD [facility] 06/24/2017, 12:10 PM

## 2017-06-24 NOTE — Telephone Encounter (Signed)
Aware of normal test.

## 2017-07-04 ENCOUNTER — Ambulatory Visit (INDEPENDENT_AMBULATORY_CARE_PROVIDER_SITE_OTHER): Payer: Medicare Other | Admitting: Family Medicine

## 2017-07-04 ENCOUNTER — Encounter: Payer: Self-pay | Admitting: Family Medicine

## 2017-07-04 VITALS — BP 132/83 | HR 66 | Temp 97.0°F | Ht 68.0 in | Wt 208.0 lb

## 2017-07-04 DIAGNOSIS — I824Y3 Acute embolism and thrombosis of unspecified deep veins of proximal lower extremity, bilateral: Secondary | ICD-10-CM

## 2017-07-04 LAB — COAGUCHEK XS/INR WAIVED
INR: 1.8 — AB (ref 0.9–1.1)
Prothrombin Time: 21.4 s

## 2017-07-04 NOTE — Patient Instructions (Addendum)
Description   Lower current dose 3.75mg  every Sunday, Tuesday, Thursday, Friday; 7.5mg  all other days this will be his last month on it as it has been 6 months since DVT and he has an ultrasound last month that showed no clots currently  INR 1.8 will adjust up slightly

## 2017-07-04 NOTE — Progress Notes (Signed)
BP 132/83   Pulse 66   Temp (!) 97 F (36.1 C) (Oral)   Ht 5\' 8"  (1.727 m)   Wt 208 lb (94.3 kg)   BMI 31.63 kg/m    Subjective:    Patient ID: [name], male    DOB: 03/05/1941, 77 y.o.   MRN: [id number]  HPI: DANYON MCGINNESS is a 77 y.o. male presenting on 07/04/2017 for Anticoagulation   HPI DVT and anticoagulation recheck Patient is coming in for DVT and anticoagulation recheck.  He is currently on Coumadin secondary to a DVT that was diagnosed in October 2018 that is been almost 6 months.  He denies any bleeding currently.  His INR is 1.8 today.  We did adjust down his dose because he was 3.8 last time and having bleeding in his gums.  He still has minimal swelling in his legs but his ultrasound for DVT last week showed that he did not have any more current clots.  Relevant past medical, surgical, family and social history reviewed and updated as indicated. Interim medical history since our last visit reviewed. Allergies and medications reviewed and updated.  Review of Systems  Constitutional: Negative for chills and fever.  Respiratory: Negative for shortness of breath and wheezing.   Cardiovascular: Negative for chest pain and leg swelling.  Gastrointestinal: Negative for anal bleeding and blood in stool.  Genitourinary: Negative for hematuria.  Musculoskeletal: Negative for back pain and gait problem.  Skin: Negative for rash.  All other systems reviewed and are negative.   Per HPI unless specifically indicated above   Allergies as of 07/04/2017      Reactions   Penicillins Itching   Has patient had a PCN reaction causing immediate rash, facial/tongue/throat swelling, SOB or lightheadedness with hypotension: no Has patient had a PCN reaction causing severe rash involving mucus membranes or skin necrosis: no Has patient had a PCN reaction that required hospitalization no Has patient had a PCN reaction occurring within the last 10 years: no If all of the above  answers are "NO", then may proceed with Cephalosporin use.   Sulfonamide Derivatives Other (See Comments)   Reaction not known      Medication List        Accurate as of 07/04/17  8:37 AM. Always use your most recent med list.          acetaminophen 325 MG tablet Commonly known as:  TYLENOL Take 650 mg by mouth every 6 (six) hours as needed.   amLODipine 5 MG tablet Commonly known as:  NORVASC TAKE 1 TABLET DAILY   atorvastatin 20 MG tablet Commonly known as:  LIPITOR TAKE 1 TABLET DAILY   Calcium-Vitamin D 600-200 MG-UNIT Caps Take 2 tablets by mouth daily. Reported on 08/01/2015   cholecalciferol 1000 units tablet Commonly known as:  VITAMIN D Take 1,000 Units by mouth daily. Reported on 08/01/2015   [employer] SPECTRAVITE ADULT 50+ PO Take 1 tablet by mouth daily. Reported on 08/01/2015   cyclobenzaprine 10 MG tablet Commonly known as:  FLEXERIL TAKE 1 TABLET BY MOUTH THREE TIMES A DAY AS NEEDED FOR MUSCLE SPASMS   diclofenac sodium 1 % Gel Commonly known as:  VOLTAREN Apply 2 g topically 4 (four) times daily.   gabapentin 300 MG capsule Commonly known as:  NEURONTIN Take 1 capsule (300 mg total) by mouth 2 (two) times daily.   Glucosamine-Chondroitin-MSM [id number] MG Tabs Take by mouth.   omeprazole 40 MG capsule Commonly known as:  PRILOSEC Take 1 capsule (40 mg total) by mouth daily.   warfarin 7.5 MG tablet Commonly known as:  COUMADIN Take as directed by the anticoagulation clinic. If you are unsure how to take this medication, talk to your nurse or doctor. Original instructions:  TAKE 1 TABLET (7.5 MG TOTAL) DAILY BY MOUTH.          Objective:    BP 132/83   Pulse 66   Temp (!) 97 F (36.1 C) (Oral)   Ht 5\' 8"  (1.727 m)   Wt 208 lb (94.3 kg)   BMI 31.63 kg/m   Wt Readings from Last 3 Encounters:  07/04/17 208 lb (94.3 kg)  06/24/17 211 lb (95.7 kg)  06/03/17 210 lb (95.3 kg)    Physical Exam  Constitutional: He is oriented to person,  place, and time. He appears well-developed and well-nourished. No distress.  Eyes: Conjunctivae are normal. No scleral icterus.  Cardiovascular: Normal rate, regular rhythm, normal heart sounds and intact distal pulses.  No murmur heard. Pulmonary/Chest: Effort normal and breath sounds normal. No respiratory distress. He has no wheezes.  Musculoskeletal: Normal range of motion. He exhibits edema (Trace edema in both lower extremities).  Neurological: He is alert and oriented to person, place, and time. Coordination normal.  Skin: Skin is warm and dry. No rash noted. He is not diaphoretic.  Psychiatric: He has a normal mood and affect. His behavior is normal.  Nursing note and vitals reviewed.   Description   Lower current dose 3.75mg  every Sunday, Tuesday, Thursday, Friday; 7.5mg  all other days this will be his last month on it as it has been 6 months since DVT and he has an ultrasound last month that showed no clots currently  INR 1.8 will adjust up slightly        Assessment & Plan:   Problem List Items Addressed This Visit      Cardiovascular and Mediastinum   Acute venous embolism and thrombosis of deep vessels of proximal lower extremity ([place]) [I82.4Y9] - Primary       Follow up plan: Return in about 3 months (around 10/03/2017), or if symptoms worsen or fail to improve, for Recheck cholesterol and hypertension.  Counseling provided for all of the vaccine components No orders of the defined types were placed in this encounter.   Caryl Pina, MD [facility] 07/04/2017, 8:37 AM

## 2017-07-16 DIAGNOSIS — M9712XD Periprosthetic fracture around internal prosthetic left knee joint, subsequent encounter: Secondary | ICD-10-CM | POA: Diagnosis not present

## 2017-07-16 DIAGNOSIS — Z4789 Encounter for other orthopedic aftercare: Secondary | ICD-10-CM | POA: Diagnosis not present

## 2017-07-27 ENCOUNTER — Other Ambulatory Visit: Payer: Self-pay | Admitting: Family Medicine

## 2017-07-27 DIAGNOSIS — M19011 Primary osteoarthritis, right shoulder: Secondary | ICD-10-CM

## 2017-07-29 NOTE — Telephone Encounter (Signed)
Last seen 07/04/16  Dr D

## 2017-07-30 ENCOUNTER — Other Ambulatory Visit: Payer: Self-pay | Admitting: Family Medicine

## 2017-07-30 DIAGNOSIS — K219 Gastro-esophageal reflux disease without esophagitis: Secondary | ICD-10-CM

## 2017-07-30 DIAGNOSIS — I119 Hypertensive heart disease without heart failure: Secondary | ICD-10-CM

## 2017-09-08 ENCOUNTER — Other Ambulatory Visit: Payer: Self-pay | Admitting: Family Medicine

## 2017-09-08 DIAGNOSIS — M25511 Pain in right shoulder: Secondary | ICD-10-CM

## 2017-09-26 DIAGNOSIS — H2512 Age-related nuclear cataract, left eye: Secondary | ICD-10-CM | POA: Diagnosis not present

## 2017-09-26 DIAGNOSIS — H43812 Vitreous degeneration, left eye: Secondary | ICD-10-CM | POA: Diagnosis not present

## 2017-09-26 DIAGNOSIS — H43392 Other vitreous opacities, left eye: Secondary | ICD-10-CM | POA: Diagnosis not present

## 2017-09-26 DIAGNOSIS — Z970 Presence of artificial eye: Secondary | ICD-10-CM | POA: Diagnosis not present

## 2017-10-01 DIAGNOSIS — L570 Actinic keratosis: Secondary | ICD-10-CM | POA: Diagnosis not present

## 2017-10-04 ENCOUNTER — Ambulatory Visit (INDEPENDENT_AMBULATORY_CARE_PROVIDER_SITE_OTHER): Payer: Medicare Other | Admitting: Family Medicine

## 2017-10-04 ENCOUNTER — Encounter: Payer: Self-pay | Admitting: Family Medicine

## 2017-10-04 VITALS — BP 138/80 | HR 59 | Temp 96.9°F | Ht 68.0 in | Wt 211.0 lb

## 2017-10-04 DIAGNOSIS — S2242XS Multiple fractures of ribs, left side, sequela: Secondary | ICD-10-CM | POA: Diagnosis not present

## 2017-10-04 DIAGNOSIS — E785 Hyperlipidemia, unspecified: Secondary | ICD-10-CM

## 2017-10-04 DIAGNOSIS — I119 Hypertensive heart disease without heart failure: Secondary | ICD-10-CM | POA: Diagnosis not present

## 2017-10-04 DIAGNOSIS — M25511 Pain in right shoulder: Secondary | ICD-10-CM | POA: Diagnosis not present

## 2017-10-04 DIAGNOSIS — M19011 Primary osteoarthritis, right shoulder: Secondary | ICD-10-CM

## 2017-10-04 DIAGNOSIS — K219 Gastro-esophageal reflux disease without esophagitis: Secondary | ICD-10-CM | POA: Diagnosis not present

## 2017-10-04 LAB — CMP14+EGFR
ALK PHOS: 128 IU/L — AB (ref 39–117)
ALT: 22 IU/L (ref 0–44)
AST: 23 IU/L (ref 0–40)
Albumin/Globulin Ratio: 1.7 (ref 1.2–2.2)
Albumin: 4 g/dL (ref 3.5–4.8)
BILIRUBIN TOTAL: 0.3 mg/dL (ref 0.0–1.2)
BUN/Creatinine Ratio: 11 (ref 10–24)
BUN: 13 mg/dL (ref 8–27)
CO2: 23 mmol/L (ref 20–29)
Calcium: 8.9 mg/dL (ref 8.6–10.2)
Chloride: 106 mmol/L (ref 96–106)
Creatinine, Ser: 1.15 mg/dL (ref 0.76–1.27)
GFR calc Af Amer: 71 mL/min/{1.73_m2} (ref >59)
GFR calc non Af Amer: 61 mL/min/{1.73_m2} (ref >59)
GLUCOSE: 143 mg/dL — AB (ref 65–99)
Globulin, Total: 2.4 g/dL (ref 1.5–4.5)
Potassium: 4.2 mmol/L (ref 3.5–5.2)
SODIUM: 143 mmol/L (ref 134–144)
Total Protein: 6.4 g/dL (ref 6.0–8.5)

## 2017-10-04 LAB — CBC WITH DIFFERENTIAL/PLATELET
BASOS ABS: 0 10*3/uL (ref 0.0–0.2)
BASOS: 1 %
EOS (ABSOLUTE): 0.2 10*3/uL (ref 0.0–0.4)
EOS: 6 %
Hematocrit: 36 % — ABNORMAL LOW (ref 37.5–51.0)
Hemoglobin: 11.7 g/dL — ABNORMAL LOW (ref 13.0–17.7)
Immature Grans (Abs): 0 10*3/uL (ref 0.0–0.1)
Immature Granulocytes: 0 %
LYMPHS ABS: 0.8 10*3/uL (ref 0.7–3.1)
LYMPHS: 23 %
MCH: 30 pg (ref 26.6–33.0)
MCHC: 32.5 g/dL (ref 31.5–35.7)
MCV: 92 fL (ref 79–97)
MONOS ABS: 0.5 10*3/uL (ref 0.1–0.9)
Monocytes: 14 %
NEUTROS PCT: 56 %
Neutrophils Absolute: 1.8 10*3/uL (ref 1.4–7.0)
Platelets: 170 10*3/uL (ref 150–450)
RBC: 3.9 x10E6/uL — ABNORMAL LOW (ref 4.14–5.80)
RDW: 13.5 % (ref 12.3–15.4)
WBC: 3.3 10*3/uL — ABNORMAL LOW (ref 3.4–10.8)

## 2017-10-04 LAB — LIPID PANEL
CHOL/HDL RATIO: 4.3 ratio (ref 0.0–5.0)
Cholesterol, Total: 150 mg/dL (ref 100–199)
HDL: 35 mg/dL — ABNORMAL LOW (ref >39)
LDL Calculated: 100 mg/dL — ABNORMAL HIGH (ref 0–99)
TRIGLYCERIDES: 73 mg/dL (ref 0–149)
VLDL Cholesterol Cal: 15 mg/dL (ref 5–40)

## 2017-10-04 MED ORDER — GABAPENTIN 300 MG PO CAPS: 300.0000 mg | ORAL_CAPSULE | Freq: Two times a day (BID) | ORAL | 3 refills | Status: DC

## 2017-10-04 MED ORDER — ATORVASTATIN CALCIUM 20 MG PO TABS: 20.0000 mg | ORAL_TABLET | Freq: Every day | ORAL | 3 refills | Status: DC

## 2017-10-04 MED ORDER — AMLODIPINE BESYLATE 5 MG PO TABS: 5.0000 mg | ORAL_TABLET | Freq: Every day | ORAL | 3 refills | Status: DC

## 2017-10-04 MED ORDER — CYCLOBENZAPRINE HCL 10 MG PO TABS: 10.0000 mg | ORAL_TABLET | Freq: Three times a day (TID) | ORAL | 3 refills | Status: DC | PRN

## 2017-10-04 MED ORDER — OMEPRAZOLE 40 MG PO CPDR: 40.0000 mg | DELAYED_RELEASE_CAPSULE | Freq: Every day | ORAL | 3 refills | Status: DC

## 2017-10-04 MED ORDER — DICLOFENAC SODIUM 1 % TD GEL: 2.0000 g | Freq: Four times a day (QID) | TRANSDERMAL | 5 refills | Status: DC

## 2017-10-04 NOTE — Progress Notes (Signed)
BP 138/80   Pulse (!) 59   Temp (!) 96.9 F (36.1 C) (Oral)   Ht '5\' 8"'  (1.727 m)   Wt 211 lb (95.7 kg)   BMI 32.08 kg/m    Subjective:    Patient ID: Shawn Lambert, male    DOB: 18-Jun-1940, 77 y.o.   MRN: [id number]  HPI: Shawn Lambert is a 77 y.o. male presenting on 10/04/2017 for Hyperlipidemia (3 mo) and Hypertension   HPI Hypertension Patient is currently on amlodipine, and their blood pressure today is 138/80. Patient denies any lightheadedness or dizziness. Patient denies headaches, blurred vision, chest pains, shortness of breath, or weakness. Denies any side effects from medication and is content with current medication.   Hyperlipidemia Patient is coming in for recheck of his hyperlipidemia. The patient is currently taking Lipitor. They deny any issues with myalgias or history of liver damage from it. They deny any focal numbness or weakness or chest pain.   GERD Patient is currently on omeprazole.  She denies any major symptoms or abdominal pain or belching or burping. She denies any blood in her stool or lightheadedness or dizziness.   Relevant past medical, surgical, family and social history reviewed and updated as indicated. Interim medical history since our last visit reviewed. Allergies and medications reviewed and updated.  Review of Systems  Constitutional: Negative for chills and fever.  Eyes: Negative for visual disturbance.  Respiratory: Negative for shortness of breath and wheezing.   Cardiovascular: Negative for chest pain and leg swelling.  Gastrointestinal: Negative for abdominal pain.  Musculoskeletal: Positive for arthralgias. Negative for back pain and gait problem.  Skin: Negative for rash.  Neurological: Negative for dizziness, weakness and numbness.  All other systems reviewed and are negative.   Per HPI unless specifically indicated above   Allergies as of 10/04/2017      Reactions   Penicillins Itching   Has patient had a PCN  reaction causing immediate rash, facial/tongue/throat swelling, SOB or lightheadedness with hypotension: no Has patient had a PCN reaction causing severe rash involving mucus membranes or skin necrosis: no Has patient had a PCN reaction that required hospitalization no Has patient had a PCN reaction occurring within the last 10 years: no If all of the above answers are "NO", then may proceed with Cephalosporin use.   Sulfonamide Derivatives Other (See Comments)   Reaction not known      Medication List        Accurate as of 10/04/17  8:21 AM. Always use your most recent med list.          acetaminophen 325 MG tablet Commonly known as:  TYLENOL Take 650 mg by mouth every 6 (six) hours as needed.   amLODipine 5 MG tablet Commonly known as:  NORVASC TAKE 1 TABLET DAILY   atorvastatin 20 MG tablet Commonly known as:  LIPITOR TAKE 1 TABLET DAILY   Calcium-Vitamin D 600-200 MG-UNIT Caps Take 2 tablets by mouth daily. Reported on 08/01/2015   cholecalciferol 1000 units tablet Commonly known as:  VITAMIN D Take 1,000 Units by mouth daily. Reported on 08/01/2015   [employer] SPECTRAVITE ADULT 50+ PO Take 1 tablet by mouth daily. Reported on 08/01/2015   cyclobenzaprine 10 MG tablet Commonly known as:  FLEXERIL TAKE 1 TABLET BY MOUTH THREE TIMES A DAY AS NEEDED FOR MUSCLE SPASMS   diclofenac sodium 1 % Gel Commonly known as:  VOLTAREN Apply 2 g topically 4 (four) times daily.   gabapentin 300  MG capsule Commonly known as:  NEURONTIN TAKE 1 CAPSULE BY MOUTH TWICE A DAY   Glucosamine-Chondroitin-MSM 750-400-375 MG Tabs Take by mouth.   omeprazole 40 MG capsule Commonly known as:  PRILOSEC Take 1 capsule (40 mg total) by mouth daily.          Objective:    BP 138/80   Pulse (!) 59   Temp (!) 96.9 F (36.1 C) (Oral)   Ht '5\' 8"'  (1.727 m)   Wt 211 lb (95.7 kg)   BMI 32.08 kg/m   Wt Readings from Last 3 Encounters:  10/04/17 211 lb (95.7 kg)  07/04/17 208 lb (94.3 kg)    06/24/17 211 lb (95.7 kg)    Physical Exam  Constitutional: He is oriented to person, place, and time. He appears well-developed and well-nourished. No distress.  Eyes: Conjunctivae are normal. No scleral icterus.  Neck: Neck supple. No thyromegaly present.  Cardiovascular: Normal rate, regular rhythm, normal heart sounds and intact distal pulses.  No murmur heard. Pulmonary/Chest: Effort normal and breath sounds normal. No respiratory distress. He has no wheezes.  Musculoskeletal: Normal range of motion. He exhibits no edema.  Lymphadenopathy:    He has no cervical adenopathy.  Neurological: He is alert and oriented to person, place, and time. Coordination normal.  Skin: Skin is warm and dry. No rash noted. He is not diaphoretic.  Psychiatric: He has a normal mood and affect. His behavior is normal.  Nursing note and vitals reviewed.     Assessment & Plan:   Problem List Items Addressed This Visit      Cardiovascular and Mediastinum   HYPERTENSION, HEART CONTROLLED W/O ASSOC CHF - Primary   Relevant Medications   amLODipine (NORVASC) 5 MG tablet   atorvastatin (LIPITOR) 20 MG tablet   Other Relevant Orders   CMP14+EGFR     Digestive   GERD   Relevant Medications   omeprazole (PRILOSEC) 40 MG capsule   Other Relevant Orders   CBC with Differential/Platelet     Other   Hyperlipidemia LDL goal <130   Relevant Medications   amLODipine (NORVASC) 5 MG tablet   atorvastatin (LIPITOR) 20 MG tablet   Other Relevant Orders   Lipid panel    Other Visit Diagnoses    Acute pain of right shoulder       Relevant Medications   cyclobenzaprine (FLEXERIL) 10 MG tablet   Closed fracture of multiple ribs of left side, sequela       Relevant Medications   diclofenac sodium (VOLTAREN) 1 % GEL   Osteoarthritis of right shoulder, unspecified osteoarthritis type       Relevant Medications   cyclobenzaprine (FLEXERIL) 10 MG tablet   gabapentin (NEURONTIN) 300 MG capsule     Refill  of Flexeril and Voltaren form, continue other medications and check labs today.  Follow up plan: Return in about 6 months (around 04/06/2018), or if symptoms worsen or fail to improve, for Hypertension and cholesterol and GERD.  Counseling provided for all of the vaccine components Orders Placed This Encounter  Procedures  . CBC with Differential/Platelet  . CMP14+EGFR  . Lipid panel    Caryl Pina, MD [facility] 10/04/2017, 8:21 AM

## 2017-10-24 ENCOUNTER — Ambulatory Visit (INDEPENDENT_AMBULATORY_CARE_PROVIDER_SITE_OTHER): Payer: Medicare Other | Admitting: *Deleted

## 2017-10-24 ENCOUNTER — Encounter: Payer: Self-pay | Admitting: *Deleted

## 2017-10-24 VITALS — BP 137/73 | HR 65 | Ht 64.5 in | Wt 209.0 lb

## 2017-10-24 DIAGNOSIS — Z0000 Encounter for general adult medical examination without abnormal findings: Secondary | ICD-10-CM

## 2017-10-24 NOTE — Progress Notes (Signed)
Subjective:   Shawn Lambert is a 77 y.o. male who presents for a Medicare Annual Wellness Visit. Shawn Lambert lives at home with wife. He has 2 sons, 3 grandchildren, and 5 greatgrandchildren. He also has stepchildren. One son lives close by and the other lives in [place]. Mr Steeves moved from [place], [place] about 22 years ago. 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 He has dogs, cats, cows, chickens, and donkeys that he tends to daily. He has a Regulatory affairs officer that he rides but does a lot of walking too. Was pinned by one of his bulls 12/2016 and was hospitalized with multiple fractures.  Review of Systems    Patient reports that his overall health is unchanged compared to last year. Basically back to baseline since injury.   Cardiac Risk Factors include: advanced age (>16mn, >>69women);hypertension;male gender;obesity (BMI >30kg/m2) Lower extremity edema. Wears compression hose. Bought a device to help put them on since he has difficulty reaching his feet. Wears the hose above his calf and right below his knee. Has trouble with swelling in his left knee above the hose. That knee is larger anyway due to the hardware. No pain or discoloration. Swelling is worse during the day and better in the mornings. Props his feet up when sitting.   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 Had it removed in his twenties and has been wearing a glass eye since then.   Ortho: bilateral knee replacements. Has pins and plates in his left knee. Surgeon said that they could consider removing the plates since his knee has healed. Difficulty with balance since having left knee repaired. Has a lot of stiffness when he gets up in the morning but that improves as he moves around. Takes Tylenol for pain. Scheduled to see surgeon soon to follow up.   All other systems negative     Current Medications (verified) Outpatient Encounter Medications as of 10/24/2017  Medication Sig  . acetaminophen (TYLENOL)  325 MG tablet Take 650 mg by mouth every 6 (six) hours as needed.  .Marland KitchenamLODipine (NORVASC) 5 MG tablet Take 1 tablet (5 mg total) by mouth daily.  .Marland Kitchenatorvastatin (LIPITOR) 20 MG tablet Take 1 tablet (20 mg total) by mouth daily.  . Calcium Carbonate-Vitamin D (CALCIUM-VITAMIN D) 600-200 MG-UNIT CAPS Take 2 tablets by mouth daily. Reported on 08/01/2015  . cholecalciferol (VITAMIN D) 1000 UNITS tablet Take 1,000 Units by mouth daily. Reported on 08/01/2015  . cyclobenzaprine (FLEXERIL) 10 MG tablet Take 1 tablet (10 mg total) by mouth 3 (three) times daily as needed for muscle spasms.  . diclofenac sodium (VOLTAREN) 1 % GEL Apply 2 g topically 4 (four) times daily.  .Marland Kitchengabapentin (NEURONTIN) 300 MG capsule Take 1 capsule (300 mg total) by mouth 2 (two) times daily.  . Glucosamine-Chondroitin-MSM 7607-371-062MG TABS Take by mouth.  . Multiple Vitamins-Minerals (CVS SPECTRAVITE ADULT 50+ PO) Take 1 tablet by mouth daily. Reported on 08/01/2015  . omeprazole (PRILOSEC) 40 MG capsule Take 1 capsule (40 mg total) by mouth daily.   Facility-Administered Encounter Medications as of 10/24/2017  Medication  . Betamethasone Sod Phos & Acet 30 MG/5ML KIT 2 mL    Allergies (verified) Penicillins and Sulfonamide derivatives   History: Past Medical History:  Diagnosis Date  . Anginal pain ([place]   . Arthritis   . Cancer (HCC)    basil cell skin cancer  . Dysrhythmia    pt.  states physician said " he has a irregular heart beat"  . GERD (gastroesophageal reflux disease)   . Hyperlipidemia   . Hypertension   . Kidney stones    Past Surgical History:  Procedure Laterality Date  . EYE SURGERY Right 1965   Prosthetic Eye  . LEG SURGERY Left 12/18/2016   fractured femur  . TOTAL KNEE ARTHROPLASTY    . TOTAL KNEE ARTHROPLASTY Right 07/26/2015   Procedure: RIGHT TOTAL KNEE ARTHROPLASTY;  Surgeon: Paralee Cancel, MD;  Location: [facility];  Service: Orthopedics;  Laterality: Right;   Family History  Problem  Relation Age of Onset  . Congestive Heart Failure Father   . Congestive Heart Failure Mother   . AAA (abdominal aortic aneurysm) Mother   . COPD Mother   . Macular degeneration Mother   . Heart attack Brother 28  . Arthritis Brother   . Multiple sclerosis Son    Social History   Socioeconomic History  . Marital status: Married    Spouse name: Not on file  . Number of children: 2  . Years of education: 45  . Highest education level: Associate degree: occupational, Hotel manager, or vocational program  Occupational History  . Occupation: Retired    Comment: Conservation officer, nature  Social Needs  . Financial resource strain: Not hard at all  . Food insecurity:    Worry: Never true    Inability: Never true  . Transportation needs:    Medical: No    Non-medical: No  Tobacco Use  . Smoking status: Never Smoker  . Smokeless tobacco: Never Used  Substance and Sexual Activity  . Alcohol use: No  . Drug use: No  . Sexual activity: Yes  Lifestyle  . Physical activity:    Days per week: 6 days    Minutes per session: 60 min  . Stress: Not at all  Relationships  . Social connections:    Talks on phone: More than three times a week    Gets together: More than three times a week    Attends religious service: Never    Active member of club or organization: No    Attends meetings of clubs or organizations: Never    Relationship status: Married  Other Topics Concern  . Not on file  Social History Narrative  . Not on file    Tobacco Use No.  Clinical Intake:     Pain : 0-10 Pain Score: 5  Pain Type: Chronic pain Pain Location: Knee Pain Orientation: Left Pain Descriptors / Indicators: Aching Pain Onset: More than a month ago Pain Frequency: Intermittent Pain Relieving Factors: Tylenol and movement Effect of Pain on Daily Activities: minimal. Has modified his activities but continues to them   Pain Relieving Factors: Tylenol and movement  Nutritional Status: BMI > 30   Obese Diabetes: No  How often do you need to have someone help you when you read instructions, pamphlets, or other written materials from your doctor or pharmacy?: 2 - Rarely What is the last grade level you completed in school?: Trade school-airtraffic mechanic  Interpreter Needed?: No  Information entered by :: Chong Sicilian, RN  Activities of Daily Living In your present state of health, do you have any difficulty performing the following activities: 10/24/2017  Hearing? Y  Comment has some hearing loss due to working around [place]? Y  Comment cataract on left eye. Does not have a right eye. Lost it in an accident when 6  Difficulty concentrating or making  decisions? N  Walking or climbing stairs? Y  Comment bilateral knee replacements. Pins and plates in left knee from accident  Dressing or bathing? N  Doing errands, shopping? N  Preparing Food and eating ? N  Using the Toilet? N  In the past six months, have you accidently leaked urine? N  Do you have problems with loss of bowel control? N  Managing your Medications? N  Managing your Finances? N  Housekeeping or managing your Housekeeping? 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 _0   Number falls in past yr: _1 Injury with Fall? Yes Yes (No Data) Yes Yes  Comment - - fractured left leg 12/18/16 fractured leg fractured left leg  Risk Factor Category  High Fall Risk - - -  -  Risk for fall due to : Impaired balance/gait;History of fall(s) - - - -  Follow up Falls prevention discussed - - - -    Safety Is the patient's home free of loose throw rugs in walkways, pet beds, electrical cords, etc?   yes      Grab bars in the bathroom? yes      Walkin shower? yes      Shower Seat? no      Handrails on the stairs?   yes      Adequate lighting?   yes  Patient Care Team: Dettinger, Fransisca Kaufmann, MD as PCP - General (Family Medicine) Paralee Cancel, MD as Consulting Physician (Orthopedic Surgery)  Hospitalizations, surgeries, and ER visits in previous 12 months Hospitalized 12/2016 for multiple fractures from being pinned by a bull. No other hospitalizations, surgeries, or ED visits.    Objective:    Today's Vitals   10/24/17 1010 10/24/17 1012  BP: 137/73   Pulse: 65   Weight: 209 lb (94.8 kg)   Height: 5' 4.5" (1.638 m)   PainSc:  5    Body mass index is 35.32 kg/m.  Advanced Directives 10/24/2017 08/01/2015 07/26/2015 07/15/2015  Does Patient Have a Medical Advance Directive? No No No No  Would patient like information on creating a medical advance directive? No - Patient declined - No - patient declined information No - patient declined information    Hearing/Vision  normal or No deficits noted during visit.   Cognitive Function: MMSE - Mini Mental State Exam 10/24/2017  Orientation to time 5  Orientation to Place 5  Registration 3  Attention/ Calculation 5  Recall 1  Language- name 2 objects 2  Language- repeat 1  Language- follow 3 step command 3  Language- read & follow direction 1  Write a sentence 1  Copy design 1  Total score 28       Normal Cognitive Function Screening: Yes    Immunizations and Health Maintenance Immunization History  Administered Date(s) Administered  . Influenza, High Dose Seasonal PF 12/08/2013, 12/30/2015, 12/27/2016  . Influenza,inj,Quad PF,6+ Mos  12/08/2012, 02/28/2015  . Pneumococcal Conjugate-13 02/28/2015   . Pneumococcal Polysaccharide-23 11/08/2016  . Zoster 06/25/2013   Health Maintenance Due  Topic Date Due  . INFLUENZA VACCINE  10/17/2017   Health Maintenance  Topic Date Due  . INFLUENZA VACCINE  10/17/2017  . TETANUS/TDAP  05/27/2018  . PNA vac Low Risk Adult  Completed        Assessment:   This is a routine wellness examination for Carlos.      Plan:    Goals    . Exercise 150 min/wk Moderate Activity        Health Maintenance Recommendations: Advance Directives-patient will discuss with wife   Additional Screening Recommendations: Lung: Low Dose CT Chest recommended if Age 79-80 years, 30 pack-year currently smoking OR have quit w/in 15years. Patient does not qualify. Hepatitis C Screening recommended: no  Today's Orders No orders of the defined types were placed in this encounter.   Keep f/u with Dettinger, Fransisca Kaufmann, MD and any other specialty appointments you may have Continue current medications Move carefully to avoid falls. Use assistive devices like a cane or walker if needed. Aim for at least 150 minutes of moderate activity a week. This can be chair exercises if necessary. Reading or puzzles are a good way to exercise your brain Stay connected with friends and family. Social connections are beneficial to your emotional and mental health.   I have personally reviewed and noted the following in the patient's chart:   . Medical and social history . Use of alcohol, tobacco or illicit drugs  . Current medications and supplements . Functional ability and status . Nutritional status . Physical activity . Advanced directives . List of other physicians . Hospitalizations, surgeries, and ER visits in previous 12 months . Vitals . Screenings to include cognitive, depression, and falls . Referrals and appointments  In addition, I have reviewed and discussed with patient certain preventive protocols, quality metrics, and best practice recommendations. A  written personalized care plan for preventive services as well as general preventive health recommendations were provided to patient.     Chong Sicilian, RN   10/24/2017

## 2017-10-24 NOTE — Patient Instructions (Signed)
  Shawn Lambert , Thank you for taking time to come for your Medicare Wellness Visit. I appreciate your ongoing commitment to your health goals. Please review the following plan we discussed and let me know if I can assist you in the future.   These are the goals we discussed: Goals    . Exercise 150 min/wk Moderate Activity       This is a list of the screening recommended for you and due dates:  Health Maintenance  Topic Date Due  . Flu Shot  10/17/2017  . Tetanus Vaccine  05/27/2018  . Pneumonia vaccines  Completed

## 2017-10-25 ENCOUNTER — Other Ambulatory Visit: Payer: Self-pay | Admitting: Family Medicine

## 2017-10-25 DIAGNOSIS — M19011 Primary osteoarthritis, right shoulder: Secondary | ICD-10-CM

## 2017-10-27 ENCOUNTER — Other Ambulatory Visit: Payer: Self-pay | Admitting: Family Medicine

## 2018-01-06 ENCOUNTER — Other Ambulatory Visit: Payer: Self-pay | Admitting: Family Medicine

## 2018-01-06 DIAGNOSIS — S2242XS Multiple fractures of ribs, left side, sequela: Secondary | ICD-10-CM

## 2018-01-10 ENCOUNTER — Encounter: Payer: Self-pay | Admitting: Family Medicine

## 2018-01-10 ENCOUNTER — Ambulatory Visit (INDEPENDENT_AMBULATORY_CARE_PROVIDER_SITE_OTHER): Payer: Medicare Other | Admitting: Family Medicine

## 2018-01-10 VITALS — BP 138/80 | HR 58 | Temp 98.0°F | Ht 64.5 in | Wt 212.0 lb

## 2018-01-10 DIAGNOSIS — Z23 Encounter for immunization: Secondary | ICD-10-CM | POA: Diagnosis not present

## 2018-01-10 DIAGNOSIS — E785 Hyperlipidemia, unspecified: Secondary | ICD-10-CM | POA: Diagnosis not present

## 2018-01-10 DIAGNOSIS — L02212 Cutaneous abscess of back [any part, except buttock]: Secondary | ICD-10-CM | POA: Diagnosis not present

## 2018-01-10 MED ORDER — ATORVASTATIN CALCIUM 20 MG PO TABS: ORAL_TABLET | ORAL | 0 refills | Status: DC

## 2018-01-10 MED ORDER — CLINDAMYCIN HCL 300 MG PO CAPS: 300.0000 mg | ORAL_CAPSULE | Freq: Four times a day (QID) | ORAL | 0 refills | Status: AC

## 2018-01-10 NOTE — Patient Instructions (Signed)
Skin Abscess A skin abscess is an infected area on or under your skin that contains pus and other material. An abscess can happen almost anywhere on your body. Some abscesses break open (rupture) on their own. Most continue to get worse unless they are treated. The infection can spread deeper into the body and into your blood, which can make you feel sick. Treatment usually involves draining the abscess. Follow these instructions at home: Abscess Care  If you have an abscess that has not drained, place a warm, clean, wet washcloth over the abscess several times a day. Do this as told by your doctor.  Follow instructions from your doctor about how to take care of your abscess. Make sure you: ? Cover the abscess with a bandage (dressing). ? Change your bandage or gauze as told by your doctor. ? Wash your hands with soap and water before you change the bandage or gauze. If you cannot use soap and water, use hand sanitizer.  Check your abscess every day for signs that the infection is getting worse. Check for: ? More redness, swelling, or pain. ? More fluid or blood. ? Warmth. ? More pus or a bad smell. Medicines   Take over-the-counter and prescription medicines only as told by your doctor.  If you were prescribed an antibiotic medicine, take it as told by your doctor. Do not stop taking the antibiotic even if you start to feel better. General instructions  To avoid spreading the infection: ? Do not share personal care items, towels, or hot tubs with others. ? Avoid making skin-to-skin contact with other people.  Keep all follow-up visits as told by your doctor. This is important. Contact a doctor if:  You have more redness, swelling, or pain around your abscess.  You have more fluid or blood coming from your abscess.  Your abscess feels warm when you touch it.  You have more pus or a bad smell coming from your abscess.  You have a fever.  Your muscles ache.  You have  chills.  You feel sick. Get help right away if:  You have very bad (severe) pain.  You see red streaks on your skin spreading away from the abscess. This information is not intended to replace advice given to you by your health care provider. Make sure you discuss any questions you have with your health care provider. Document Released: 08/22/2007 Document Revised: 10/30/2015 Document Reviewed: 01/12/2015 Elsevier Interactive Patient Education  2018 [employer]  

## 2018-01-10 NOTE — Progress Notes (Signed)
Subjective:    Patient ID: Shawn Lambert, male    DOB: 01-15-1941, 77 y.o.   MRN: [id number]  Chief Complaint:  Abcess on back   HPI: Shawn Lambert is a 77 y.o. male presenting on 01/10/2018 for Abcess on back  Pt presents today with an abscess on his upper mid back. States this started several days ago and is becoming worse. States his wife has been cleaning it with peroxide without relief of symptoms. Pt states the area was draining a few days ago, but has since stopped. He states the area is tender to touch and burning. Denies fever or chills.  Pt also reports he is out of his lipitor and needs a refill. Labs completed in July. He denies chest pain, shortness of breath, dizziness. He does try to watch diet, does not exercise on a regular basis.   Relevant past medical, surgical, family, and social history reviewed and updated as indicated.  Allergies and medications reviewed and updated.   Past Medical History:  Diagnosis Date  . Anginal pain ([facility])   . Arthritis   . Cancer ([facility])    basil cell skin cancer  . Dysrhythmia    pt. states physician said " he has a irregular heart beat"  . GERD (gastroesophageal reflux disease)   . Hyperlipidemia   . Hypertension   . Kidney stones     Past Surgical History:  Procedure Laterality Date  . EYE SURGERY Right 1965   Prosthetic Eye  . LEG SURGERY Left 12/18/2016   fractured femur  . TOTAL KNEE ARTHROPLASTY    . TOTAL KNEE ARTHROPLASTY Right 07/26/2015   Procedure: RIGHT TOTAL KNEE ARTHROPLASTY;  Surgeon: Paralee Cancel, MD;  Location: [facility];  Service: Orthopedics;  Laterality: Right;    Social History   Socioeconomic History  . Marital status: Married    Spouse name: Not on file  . Number of children: 2  . Years of education: 50  . Highest education level: Associate degree: occupational, Hotel manager, or vocational program  Occupational History  . Occupation: Retired    Comment: Conservation officer, nature  Social Needs  .  Financial resource strain: Not hard at all  . Food insecurity:    Worry: Never true    Inability: Never true  . Transportation needs:    Medical: No    Non-medical: No  Tobacco Use  . Smoking status: Never Smoker  . Smokeless tobacco: Never Used  Substance and Sexual Activity  . Alcohol use: No  . Drug use: No  . Sexual activity: Yes  Lifestyle  . Physical activity:    Days per week: 6 days    Minutes per session: 60 min  . Stress: Not at all  Relationships  . Social connections:    Talks on phone: More than three times a week    Gets together: More than three times a week    Attends religious service: Never    Active member of club or organization: No    Attends meetings of clubs or organizations: Never    Relationship status: Married  . Intimate partner violence:    Fear of current or ex partner: No    Emotionally abused: No    Physically abused: No    Forced sexual activity: No  Other Topics Concern  . Not on file  Social History Narrative  . Not on file    Outpatient Encounter Medications as of 01/10/2018  Medication Sig  . acetaminophen (TYLENOL)  325 MG tablet Take 650 mg by mouth every 6 (six) hours as needed.  Marland Kitchen amLODipine (NORVASC) 5 MG tablet Take 1 tablet (5 mg total) by mouth daily.  Marland Kitchen atorvastatin (LIPITOR) 20 MG tablet TAKE 1 TABLET DAILY  . Calcium Carbonate-Vitamin D (CALCIUM-VITAMIN D) 600-200 MG-UNIT CAPS Take 2 tablets by mouth daily. Reported on 08/01/2015  . cholecalciferol (VITAMIN D) 1000 UNITS tablet Take 1,000 Units by mouth daily. Reported on 08/01/2015  . cyclobenzaprine (FLEXERIL) 10 MG tablet Take 1 tablet (10 mg total) by mouth 3 (three) times daily as needed for muscle spasms.  . diclofenac sodium (VOLTAREN) 1 % GEL APPLY 2 G TOPICALLY 4 TIMESA DAY  . gabapentin (NEURONTIN) 300 MG capsule Take 1 capsule (300 mg total) by mouth 2 (two) times daily.  . Glucosamine-Chondroitin-MSM 250-037-048 MG TABS Take by mouth.  . Multiple  Vitamins-Minerals ([employer] SPECTRAVITE ADULT 50+ PO) Take 1 tablet by mouth daily. Reported on 08/01/2015  . omeprazole (PRILOSEC) 40 MG capsule Take 1 capsule (40 mg total) by mouth daily.  Marland Kitchen warfarin (COUMADIN) 7.5 MG tablet TAKE 1 TABLET (7.5 MG TOTAL) DAILY BY MOUTH.  . [DISCONTINUED] atorvastatin (LIPITOR) 20 MG tablet TAKE 1 TABLET DAILY (MUST  BE SEEN BEFORE NEXT REFILL)  . clindamycin (CLEOCIN) 300 MG capsule Take 1 capsule (300 mg total) by mouth 4 (four) times daily for 7 days.   Facility-Administered Encounter Medications as of 01/10/2018  Medication  . Betamethasone Sod Phos & Acet 30 MG/5ML KIT 2 mL    Allergies  Allergen Reactions  . Penicillins Itching    Has patient had a PCN reaction causing immediate rash, facial/tongue/throat swelling, SOB or lightheadedness with hypotension: no Has patient had a PCN reaction causing severe rash involving mucus membranes or skin necrosis: no Has patient had a PCN reaction that required hospitalization no Has patient had a PCN reaction occurring within the last 10 years: no If all of the above answers are "NO", then may proceed with Cephalosporin use.   . Sulfonamide Derivatives Other (See Comments)    Reaction not known    Review of Systems  Constitutional: Negative for activity change, appetite change, chills, fatigue and fever.  Respiratory: Negative for cough, chest tightness and shortness of breath.   Cardiovascular: Negative for chest pain, palpitations and leg swelling.  Gastrointestinal: Negative for abdominal pain, diarrhea, nausea and vomiting.  Skin: Positive for wound (abscess to mid upper back).  Neurological: Negative for weakness.  All other systems reviewed and are negative.       Objective:    BP 138/80   Pulse (!) 58   Temp 98 F (36.7 C) (Oral)   Ht 5' 4.5" (1.638 m)   Wt 212 lb (96.2 kg)   BMI 35.83 kg/m    Wt Readings from Last 3 Encounters:  01/10/18 212 lb (96.2 kg)  10/24/17 209 lb (94.8 kg)    10/04/17 211 lb (95.7 kg)    Physical Exam  Constitutional: He is oriented to person, place, and time. He appears well-developed and well-nourished. He is cooperative. No distress.  HENT:  Head: Normocephalic.  Cardiovascular: Normal rate, regular rhythm and normal heart sounds. Exam reveals no gallop and no friction rub.  No murmur heard. Pulmonary/Chest: Effort normal and breath sounds normal. No respiratory distress.  Neurological: He is alert and oriented to person, place, and time.  Skin: Skin is warm and dry. Capillary refill takes less than 2 seconds.     Psychiatric: He has a normal mood  and affect. His speech is normal and behavior is normal. Judgment and thought content normal. Cognition and memory are normal.  Nursing note and vitals reviewed.   Results for orders placed or performed in visit on 10/04/17  CBC with Differential/Platelet  Result Value Ref Range   WBC 3.3 (L) 3.4 - 10.8 x10E3/uL   RBC 3.90 (L) 4.14 - 5.80 x10E6/uL   Hemoglobin 11.7 (L) 13.0 - 17.7 g/dL   Hematocrit 36.0 (L) 37.5 - 51.0 %   MCV 92 79 - 97 fL   MCH 30.0 26.6 - 33.0 pg   MCHC 32.5 31.5 - 35.7 g/dL   RDW 13.5 12.3 - 15.4 %   Platelets 170 150 - 450 x10E3/uL   Neutrophils 56 Not Estab. %   Lymphs 23 Not Estab. %   Monocytes 14 Not Estab. %   Eos 6 Not Estab. %   Basos 1 Not Estab. %   Neutrophils Absolute 1.8 1.4 - 7.0 x10E3/uL   Lymphocytes Absolute 0.8 0.7 - 3.1 x10E3/uL   Monocytes Absolute 0.5 0.1 - 0.9 x10E3/uL   EOS (ABSOLUTE) 0.2 0.0 - 0.4 x10E3/uL   Basophils Absolute 0.0 0.0 - 0.2 x10E3/uL   Immature Granulocytes 0 Not Estab. %   Immature Grans (Abs) 0.0 0.0 - 0.1 x10E3/uL  CMP14+EGFR  Result Value Ref Range   Glucose 143 (H) 65 - 99 mg/dL   BUN 13 8 - 27 mg/dL   Creatinine, Ser 1.15 0.76 - 1.27 mg/dL   GFR calc non Af Amer 61 >59 mL/min/1.73   GFR calc Af Amer 71 >59 mL/min/1.73   BUN/Creatinine Ratio 11 10 - 24   Sodium 143 134 - 144 mmol/L   Potassium 4.2 3.5 - 5.2  mmol/L   Chloride 106 96 - 106 mmol/L   CO2 23 20 - 29 mmol/L   Calcium 8.9 8.6 - 10.2 mg/dL   Total Protein 6.4 6.0 - 8.5 g/dL   Albumin 4.0 3.5 - 4.8 g/dL   Globulin, Total 2.4 1.5 - 4.5 g/dL   Albumin/Globulin Ratio 1.7 1.2 - 2.2   Bilirubin Total 0.3 0.0 - 1.2 mg/dL   Alkaline Phosphatase 128 (H) 39 - 117 IU/L   AST 23 0 - 40 IU/L   ALT 22 0 - 44 IU/L  Lipid panel  Result Value Ref Range   Cholesterol, Total 150 100 - 199 mg/dL   Triglycerides 73 0 - 149 mg/dL   HDL 35 (L) >39 mg/dL   VLDL Cholesterol Cal 15 5 - 40 mg/dL   LDL Calculated 100 (H) 0 - 99 mg/dL   Chol/HDL Ratio 4.3 0.0 - 5.0 ratio   Procedure Incision and Drainage: Deedra Ehrich denies allergy to lidocaine, epinephrine, iodine/Betadine. Informed consent provided. Appropriate timeout performed.  Indication : fluctuant abscess to mid upper back .  Area was cleaned x2 with rubbing alcohol.  Local anesthesia achieved with 2 mL of lidocaine injected subcutaneously.  Area was cleaned with Betadine swab x3.  Area was tested for anesthesia.  A stab incision using an 11 blade was performed.  Moderate amount of purulent discharge from lesion obtained.  Area was milked until no purulent material expressed.  Less than 5 cc of blood loss.  Hemostasis achieved with pressure.  Area was cleaned, topical antibiotic applied, sterile dressing applied.  Patient tolerated procedure well.  Home care instructions were reviewed with the caregiver.  Handout was provided.   Pertinent labs & imaging results that were available during my care of the patient  were reviewed by me and considered in my medical decision making.  Assessment & Plan:  Steward was seen today for abcess on back.  Diagnoses and all orders for this visit:  Abscess of back I&D in office with moderate amount of purulent drainage expressed. Covered with dressing. Wound care discussed with patient. Pt aware to allow the wound to drain. Clindamycin as prescribed. Pt aware to  report any worsening of symptoms or diarrhea.  -     clindamycin (CLEOCIN) 300 MG capsule; Take 1 capsule (300 mg total) by mouth 4 (four) times daily for 7 days.  Encounter for immunization -     Flu vaccine HIGH DOSE PF  Hyperlipidemia LDL goal <130 Diet and exercise discussed.  -     atorvastatin (LIPITOR) 20 MG tablet; TAKE 1 TABLET DAILY    Continue all other maintenance medications.  Follow up plan: Return in about 2 weeks (around 01/24/2018), or if symptoms worsen or fail to improve.  Educational handout given for abscess  The above assessment and management plan was discussed with the patient. The patient verbalized understanding of and has agreed to the management plan. Patient is aware to call the clinic if symptoms persist or worsen. Patient is aware when to return to the clinic for a follow-up visit. Patient educated on when it is appropriate to go to the emergency department.   Monia Pouch, FNP-C [facility] [phone]

## 2018-03-13 DIAGNOSIS — Z96652 Presence of left artificial knee joint: Secondary | ICD-10-CM | POA: Insufficient documentation

## 2018-04-07 ENCOUNTER — Encounter: Payer: Self-pay | Admitting: Family Medicine

## 2018-04-07 ENCOUNTER — Ambulatory Visit (INDEPENDENT_AMBULATORY_CARE_PROVIDER_SITE_OTHER): Payer: Medicare Other | Admitting: Family Medicine

## 2018-04-07 VITALS — BP 144/69 | HR 57 | Temp 97.2°F | Ht 64.5 in | Wt 205.8 lb

## 2018-04-07 DIAGNOSIS — I119 Hypertensive heart disease without heart failure: Secondary | ICD-10-CM | POA: Diagnosis not present

## 2018-04-07 DIAGNOSIS — K219 Gastro-esophageal reflux disease without esophagitis: Secondary | ICD-10-CM

## 2018-04-07 DIAGNOSIS — E785 Hyperlipidemia, unspecified: Secondary | ICD-10-CM

## 2018-04-07 MED ORDER — AMLODIPINE BESYLATE 5 MG PO TABS: 5.0000 mg | ORAL_TABLET | Freq: Every day | ORAL | 3 refills | Status: DC

## 2018-04-07 MED ORDER — GABAPENTIN 300 MG PO CAPS: 300.0000 mg | ORAL_CAPSULE | Freq: Two times a day (BID) | ORAL | 3 refills | Status: DC

## 2018-04-07 MED ORDER — OMEPRAZOLE 40 MG PO CPDR: 40.0000 mg | DELAYED_RELEASE_CAPSULE | Freq: Every day | ORAL | 3 refills | Status: DC

## 2018-04-07 MED ORDER — ATORVASTATIN CALCIUM 20 MG PO TABS: ORAL_TABLET | ORAL | 3 refills | Status: DC

## 2018-04-07 NOTE — Progress Notes (Signed)
BP (!) 144/69   Pulse (!) 57   Temp (!) 97.2 F (36.2 C) (Oral)   Ht 5' 4.5" (1.638 m)   Wt 205 lb 12.8 oz (93.4 kg)   BMI 34.78 kg/m    Subjective:    Patient ID: Shawn Lambert, male    DOB: 10-30-1940, 78 y.o.   MRN: [id number]  HPI: Shawn Lambert is a 78 y.o. male presenting on 04/07/2018 for Hypertension (6 month follow up) and Hyperlipidemia   HPI Hypertension Patient is currently on amlodipine, and their blood pressure today is 144/69. Patient denies any lightheadedness or dizziness. Patient denies headaches, blurred vision, chest pains, shortness of breath, or weakness. Denies any side effects from medication and is content with current medication.   GERD Patient is currently on omeprazole.  She denies any major symptoms or abdominal pain or belching or burping. She denies any blood in her stool or lightheadedness or dizziness.   Hyperlipidemia Patient is coming in for recheck of his hyperlipidemia. The patient is currently taking Lipitor. They deny any issues with myalgias or history of liver damage from it. They deny any focal numbness or weakness or chest pain.   Patient is struggling emotionally right now because of loss of his wife in November 3 months ago, was very unexpected as she was very much younger than him.  He has been struggling with this but is keeping his family and church close  Relevant past medical, surgical, family and social history reviewed and updated as indicated. Interim medical history since our last visit reviewed. Allergies and medications reviewed and updated.  Review of Systems  Constitutional: Negative for chills and fever.  Respiratory: Negative for shortness of breath and wheezing.   Cardiovascular: Negative for chest pain and leg swelling.  Musculoskeletal: Negative for back pain and gait problem.  Skin: Negative for rash.  Neurological: Negative for dizziness, weakness, light-headedness and numbness.  Psychiatric/Behavioral:  Positive for dysphoric mood.  All other systems reviewed and are negative.   Per HPI unless specifically indicated above   Allergies as of 04/07/2018      Reactions   Penicillins Itching   Has patient had a PCN reaction causing immediate rash, facial/tongue/throat swelling, SOB or lightheadedness with hypotension: no Has patient had a PCN reaction causing severe rash involving mucus membranes or skin necrosis: no Has patient had a PCN reaction that required hospitalization no Has patient had a PCN reaction occurring within the last 10 years: no If all of the above answers are "NO", then may proceed with Cephalosporin use.   Sulfonamide Derivatives Other (See Comments)   Reaction not known      Medication List       Accurate as of April 07, 2018  8:31 AM. Always use your most recent med list.        acetaminophen 325 MG tablet Commonly known as:  TYLENOL Take 650 mg by mouth every 6 (six) hours as needed.   amLODipine 5 MG tablet Commonly known as:  NORVASC Take 1 tablet (5 mg total) by mouth daily.   atorvastatin 20 MG tablet Commonly known as:  LIPITOR TAKE 1 TABLET DAILY   Calcium-Vitamin D 600-200 MG-UNIT Caps Take 2 tablets by mouth daily. Reported on 08/01/2015   cholecalciferol 1000 units tablet Commonly known as:  VITAMIN D Take 1,000 Units by mouth daily. Reported on 08/01/2015   CVS SPECTRAVITE ADULT 50+ PO Take 1 tablet by mouth daily. Reported on 08/01/2015   gabapentin 300  MG capsule Commonly known as:  NEURONTIN Take 1 capsule (300 mg total) by mouth 2 (two) times daily.   Glucosamine-Chondroitin-MSM 865-784-696 MG Tabs Take by mouth.   omeprazole 40 MG capsule Commonly known as:  PRILOSEC Take 1 capsule (40 mg total) by mouth daily.          Objective:    BP (!) 144/69   Pulse (!) 57   Temp (!) 97.2 F (36.2 C) (Oral)   Ht 5' 4.5" (1.638 m)   Wt 205 lb 12.8 oz (93.4 kg)   BMI 34.78 kg/m   Wt Readings from Last 3 Encounters:    04/07/18 205 lb 12.8 oz (93.4 kg)  01/10/18 212 lb (96.2 kg)  10/24/17 209 lb (94.8 kg)    Physical Exam Vitals signs and nursing note reviewed.  Constitutional:      General: He is not in acute distress.    Appearance: He is well-developed. He is not diaphoretic.  Eyes:     General: No scleral icterus.    Conjunctiva/sclera: Conjunctivae normal.  Neck:     Musculoskeletal: Neck supple.     Thyroid: No thyromegaly.  Cardiovascular:     Rate and Rhythm: Normal rate and regular rhythm.     Heart sounds: Normal heart sounds. No murmur.  Pulmonary:     Effort: Pulmonary effort is normal. No respiratory distress.     Breath sounds: Normal breath sounds. No wheezing.  Musculoskeletal: Normal range of motion.  Lymphadenopathy:     Cervical: No cervical adenopathy.  Skin:    General: Skin is warm and dry.     Findings: No rash.  Neurological:     Mental Status: He is alert and oriented to person, place, and time.     Coordination: Coordination normal.  Psychiatric:        Behavior: Behavior normal.         Assessment & Plan:   Problem List Items Addressed This Visit      Cardiovascular and Mediastinum   HYPERTENSION, HEART CONTROLLED W/O ASSOC CHF - Primary   Relevant Medications   amLODipine (NORVASC) 5 MG tablet   atorvastatin (LIPITOR) 20 MG tablet   Other Relevant Orders   CMP14+EGFR     Digestive   GERD   Relevant Medications   omeprazole (PRILOSEC) 40 MG capsule   Other Relevant Orders   CBC with Differential/Platelet     Other   Hyperlipidemia LDL goal <130   Relevant Medications   amLODipine (NORVASC) 5 MG tablet   atorvastatin (LIPITOR) 20 MG tablet   Other Relevant Orders   Lipid panel      Continue amlodipine Lipitor and omeprazole.  Will check labs today. Follow up plan: Return in about 6 months (around 10/06/2018), or if symptoms worsen or fail to improve, for Hypertension and GERD recheck.  Counseling provided for all of the vaccine  components Orders Placed This Encounter  Procedures  . CMP14+EGFR  . Lipid panel  . CBC with Differential/Platelet    Caryl Pina, MD [facility] 04/07/2018, 8:31 AM

## 2018-04-08 DIAGNOSIS — Z85828 Personal history of other malignant neoplasm of skin: Secondary | ICD-10-CM | POA: Diagnosis not present

## 2018-04-08 DIAGNOSIS — L821 Other seborrheic keratosis: Secondary | ICD-10-CM | POA: Diagnosis not present

## 2018-04-08 DIAGNOSIS — L570 Actinic keratosis: Secondary | ICD-10-CM | POA: Diagnosis not present

## 2018-04-08 LAB — CMP14+EGFR
ALT: 20 IU/L (ref 0–44)
AST: 17 IU/L (ref 0–40)
Albumin/Globulin Ratio: 1.6 (ref 1.2–2.2)
Albumin: 3.8 g/dL (ref 3.7–4.7)
Alkaline Phosphatase: 125 IU/L — ABNORMAL HIGH (ref 39–117)
BUN/Creatinine Ratio: 11 (ref 10–24)
BUN: 15 mg/dL (ref 8–27)
Bilirubin Total: 0.3 mg/dL (ref 0.0–1.2)
CO2: 25 mmol/L (ref 20–29)
CREATININE: 1.31 mg/dL — AB (ref 0.76–1.27)
Calcium: 9.3 mg/dL (ref 8.6–10.2)
Chloride: 105 mmol/L (ref 96–106)
GFR calc Af Amer: 60 mL/min/{1.73_m2} (ref >59)
GFR calc non Af Amer: 52 mL/min/{1.73_m2} — ABNORMAL LOW (ref >59)
Globulin, Total: 2.4 g/dL (ref 1.5–4.5)
Glucose: 149 mg/dL — ABNORMAL HIGH (ref 65–99)
Potassium: 4.4 mmol/L (ref 3.5–5.2)
Sodium: 143 mmol/L (ref 134–144)
Total Protein: 6.2 g/dL (ref 6.0–8.5)

## 2018-04-08 LAB — LIPID PANEL
CHOLESTEROL TOTAL: 148 mg/dL (ref 100–199)
Chol/HDL Ratio: 3.6 ratio (ref 0.0–5.0)
HDL: 41 mg/dL (ref >39)
LDL CALC: 95 mg/dL (ref 0–99)
Triglycerides: 58 mg/dL (ref 0–149)
VLDL Cholesterol Cal: 12 mg/dL (ref 5–40)

## 2018-04-08 LAB — CBC WITH DIFFERENTIAL/PLATELET
BASOS: 1 %
Basophils Absolute: 0 10*3/uL (ref 0.0–0.2)
EOS (ABSOLUTE): 0.1 10*3/uL (ref 0.0–0.4)
Eos: 4 %
Hematocrit: 38.8 % (ref 37.5–51.0)
Hemoglobin: 12.1 g/dL — ABNORMAL LOW (ref 13.0–17.7)
IMMATURE GRANULOCYTES: 0 %
Immature Grans (Abs): 0 10*3/uL (ref 0.0–0.1)
Lymphocytes Absolute: 0.9 10*3/uL (ref 0.7–3.1)
Lymphs: 25 %
MCH: 28.8 pg (ref 26.6–33.0)
MCHC: 31.2 g/dL — ABNORMAL LOW (ref 31.5–35.7)
MCV: 92 fL (ref 79–97)
MONOS ABS: 0.5 10*3/uL (ref 0.1–0.9)
Monocytes: 14 %
Neutrophils Absolute: 1.9 10*3/uL (ref 1.4–7.0)
Neutrophils: 56 %
Platelets: 207 10*3/uL (ref 150–450)
RBC: 4.2 x10E6/uL (ref 4.14–5.80)
RDW: 13.2 % (ref 11.6–15.4)
WBC: 3.5 10*3/uL (ref 3.4–10.8)

## 2018-05-04 ENCOUNTER — Observation Stay (HOSPITAL_COMMUNITY)
Admission: EM | Admit: 2018-05-04 | Discharge: 2018-05-05 | Disposition: A | Discharge status: Home / Self Care | Payer: Medicare Other | Attending: Internal Medicine | Admitting: Internal Medicine

## 2018-05-04 ENCOUNTER — Other Ambulatory Visit: Payer: Self-pay

## 2018-05-04 ENCOUNTER — Emergency Department (HOSPITAL_COMMUNITY): Payer: Medicare Other

## 2018-05-04 ENCOUNTER — Encounter (HOSPITAL_COMMUNITY): Payer: Self-pay | Admitting: Emergency Medicine

## 2018-05-04 ENCOUNTER — Other Ambulatory Visit (HOSPITAL_COMMUNITY): Payer: Self-pay

## 2018-05-04 DIAGNOSIS — I10 Essential (primary) hypertension: Secondary | ICD-10-CM | POA: Diagnosis not present

## 2018-05-04 DIAGNOSIS — Z85828 Personal history of other malignant neoplasm of skin: Secondary | ICD-10-CM | POA: Diagnosis not present

## 2018-05-04 DIAGNOSIS — Z96651 Presence of right artificial knee joint: Secondary | ICD-10-CM | POA: Diagnosis not present

## 2018-05-04 DIAGNOSIS — K219 Gastro-esophageal reflux disease without esophagitis: Secondary | ICD-10-CM | POA: Diagnosis present

## 2018-05-04 DIAGNOSIS — R0789 Other chest pain: Principal | ICD-10-CM | POA: Insufficient documentation

## 2018-05-04 DIAGNOSIS — I119 Hypertensive heart disease without heart failure: Secondary | ICD-10-CM | POA: Diagnosis present

## 2018-05-04 DIAGNOSIS — R079 Chest pain, unspecified: Secondary | ICD-10-CM | POA: Diagnosis not present

## 2018-05-04 DIAGNOSIS — Z79899 Other long term (current) drug therapy: Secondary | ICD-10-CM | POA: Diagnosis not present

## 2018-05-04 DIAGNOSIS — E785 Hyperlipidemia, unspecified: Secondary | ICD-10-CM | POA: Diagnosis not present

## 2018-05-04 DIAGNOSIS — I152 Hypertension secondary to endocrine disorders: Secondary | ICD-10-CM | POA: Insufficient documentation

## 2018-05-04 LAB — CBC
HEMATOCRIT: 38.6 % — AB (ref 39.0–52.0)
Hemoglobin: 12.4 g/dL — ABNORMAL LOW (ref 13.0–17.0)
MCH: 29.2 pg (ref 26.0–34.0)
MCHC: 32.1 g/dL (ref 30.0–36.0)
MCV: 90.8 fL (ref 80.0–100.0)
Platelets: 222 10*3/uL (ref 150–400)
RBC: 4.25 MIL/uL (ref 4.22–5.81)
RDW: 13.1 % (ref 11.5–15.5)
WBC: 4.2 10*3/uL (ref 4.0–10.5)
nRBC: 0 % (ref 0.0–0.2)

## 2018-05-04 LAB — BASIC METABOLIC PANEL
Anion gap: 7 (ref 5–15)
BUN: 14 mg/dL (ref 8–23)
CO2: 26 mmol/L (ref 22–32)
CREATININE: 1.09 mg/dL (ref 0.61–1.24)
Calcium: 9 mg/dL (ref 8.9–10.3)
Chloride: 106 mmol/L (ref 98–111)
GFR calc Af Amer: >60 mL/min (ref >60)
GFR calc non Af Amer: >60 mL/min (ref >60)
Glucose, Bld: 135 mg/dL — ABNORMAL HIGH (ref 70–99)
Potassium: 3.9 mmol/L (ref 3.5–5.1)
Sodium: 139 mmol/L (ref 135–145)

## 2018-05-04 LAB — POCT I-STAT TROPONIN I: Troponin i, poc: 0.01 ng/mL (ref 0.00–0.08)

## 2018-05-04 LAB — PROTIME-INR
INR: 1.02
Prothrombin Time: 13.3 seconds (ref 11.4–15.2)

## 2018-05-04 MED ORDER — ACETAMINOPHEN 325 MG PO TABS: 650.0000 mg | ORAL_TABLET | ORAL | Status: DC | PRN

## 2018-05-04 MED ORDER — SODIUM CHLORIDE 0.9 % IV SOLN: 250.0000 mL | INTRAVENOUS | Status: DC | PRN

## 2018-05-04 MED ORDER — SODIUM CHLORIDE 0.9% FLUSH: 3.0000 mL | Freq: Once | INTRAVENOUS | Status: DC

## 2018-05-04 MED ORDER — ATORVASTATIN CALCIUM 10 MG PO TABS
20.0000 mg | ORAL_TABLET | Freq: Every day | ORAL | Status: DC
  Filled 2018-05-04 (×2): qty 2

## 2018-05-04 MED ORDER — PANTOPRAZOLE SODIUM 40 MG PO TBEC
40.0000 mg | DELAYED_RELEASE_TABLET | Freq: Every day | ORAL | Status: DC
  Administered 2018-05-05: 40 mg via ORAL
  Filled 2018-05-04: qty 1

## 2018-05-04 MED ORDER — ASPIRIN 81 MG PO CHEW
324.0000 mg | CHEWABLE_TABLET | Freq: Once | ORAL | Status: AC
  Administered 2018-05-04: 324 mg via ORAL
  Filled 2018-05-04: qty 4

## 2018-05-04 MED ORDER — ONDANSETRON HCL 4 MG/2ML IJ SOLN: 4.0000 mg | Freq: Four times a day (QID) | INTRAMUSCULAR | Status: DC | PRN

## 2018-05-04 MED ORDER — SODIUM CHLORIDE 0.9% FLUSH: 3.0000 mL | INTRAVENOUS | Status: DC | PRN

## 2018-05-04 MED ORDER — ASPIRIN EC 81 MG PO TBEC
81.0000 mg | DELAYED_RELEASE_TABLET | Freq: Every day | ORAL | Status: DC
  Administered 2018-05-05: 81 mg via ORAL
  Filled 2018-05-04: qty 1

## 2018-05-04 MED ORDER — MORPHINE SULFATE (PF) 2 MG/ML IV SOLN: 1.0000 mg | INTRAVENOUS | Status: DC | PRN

## 2018-05-04 MED ORDER — NITROGLYCERIN 0.4 MG SL SUBL
0.4000 mg | SUBLINGUAL_TABLET | SUBLINGUAL | Status: DC | PRN
  Administered 2018-05-04 (×3): 0.4 mg via SUBLINGUAL
  Filled 2018-05-04: qty 1

## 2018-05-04 MED ORDER — GABAPENTIN 300 MG PO CAPS
300.0000 mg | ORAL_CAPSULE | Freq: Two times a day (BID) | ORAL | Status: DC
  Administered 2018-05-04 – 2018-05-05 (×2): 300 mg via ORAL
  Filled 2018-05-04 (×2): qty 1

## 2018-05-04 MED ORDER — ENOXAPARIN SODIUM 40 MG/0.4ML ~~LOC~~ SOLN: 40.0000 mg | SUBCUTANEOUS | Status: DC

## 2018-05-04 MED ORDER — AMLODIPINE BESYLATE 5 MG PO TABS
5.0000 mg | ORAL_TABLET | Freq: Every day | ORAL | Status: DC
  Administered 2018-05-05: 5 mg via ORAL
  Filled 2018-05-04: qty 1

## 2018-05-04 MED ORDER — SODIUM CHLORIDE 0.9% FLUSH
3.0000 mL | Freq: Two times a day (BID) | INTRAVENOUS | Status: DC
  Administered 2018-05-05: 3 mL via INTRAVENOUS

## 2018-05-04 NOTE — ED Provider Notes (Signed)
[facility] Provider Note   CSN: [id number] Arrival date & time: 05/04/18  1702     History   Chief Complaint Chief Complaint  Patient presents with  . Chest Pain    HPI Shawn Lambert is a 78 y.o. male.  HPI  The patient is a 78 year old male who has a known history of chest pain in the past, hyperlipidemia and hypertension, also has a history of being seen by a cardiologist in the past, was told that there was no obvious cause of his chest pain.  The most recent notes that I can access are from approximately 4 years ago.  At that time the patient had a right bundle branch block on an EKG.  There are notes that suggest that the patient has had a prior DVT and has been taking Coumadin.  He reports that he developed some chest pain that occurred this morning after having acute onset of left arm pain last night.  The arm pain seems to have come and gone throughout the day but the chest pain started in the morning and has been persistent throughout the morning, it is a heavy dull sensation on the left side of the chest, it is not associated with coughing, fever, vomiting or diarrhea.  It is not associated with position or deep breathing or palpation of the chest wall.  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  At this time his symptoms are mild but present.  He denies associated nausea or diaphoresis.  Past Medical History:  Diagnosis Date  . Anginal pain ([place])   . Arthritis   . Cancer (HCC)    basil cell skin cancer  . Dysrhythmia    pt. states physician said " he has a irregular heart beat"  . GERD (gastroesophageal reflux disease)   . Hyperlipidemia   . Hypertension   . Kidney stones     Patient Active Problem List   Diagnosis Date Noted  . Chest pain 05/04/2018  . Acute venous embolism and thrombosis of deep vessels  of proximal lower extremity ([place]) [I82.4Y9] 01/10/2017  . Closed fracture of transverse process of lumbar vertebra ([place]) 12/18/2016  . Fracture of multiple ribs of both sides 12/18/2016  . S/P right TKA 07/26/2015  . S/P knee replacement 07/26/2015  . HYPERTENSION, HEART CONTROLLED W/O ASSOC CHF 07/12/2009  . Hyperlipidemia LDL goal <130 06/15/2009  . GERD 06/15/2009  . Osteoarthritis of both knees 06/15/2009  . ABNORMAL STRESS ELECTROCARDIOGRAM 06/15/2009    Past Surgical History:  Procedure Laterality Date  . EYE SURGERY Right 1965   Prosthetic Eye  . LEG SURGERY Left 12/18/2016   fractured femur  . TOTAL KNEE ARTHROPLASTY    . TOTAL KNEE ARTHROPLASTY Right 07/26/2015   Procedure: RIGHT TOTAL KNEE ARTHROPLASTY;  Surgeon: Paralee Cancel, MD;  Location: [facility];  Service: Orthopedics;  Laterality: Right;        Home Medications    Prior to Admission medications   Medication Sig Start Date End Date Taking? Authorizing Provider  acetaminophen (TYLENOL) 325 MG tablet Take 650 mg by mouth every 6 (six) hours as needed.    [provider]  amLODipine (NORVASC) 5 MG tablet Take 1 tablet (5 mg total) by mouth daily. 04/07/18   Dettinger, Fransisca Kaufmann, MD  atorvastatin (LIPITOR) 20  MG tablet TAKE 1 TABLET DAILY 04/07/18   Dettinger, Fransisca Kaufmann, MD  Calcium Carbonate-Vitamin D (CALCIUM-VITAMIN D) 600-200 MG-UNIT CAPS Take 2 tablets by mouth daily. Reported on 08/01/2015    [provider]  cholecalciferol (VITAMIN D) 1000 UNITS tablet Take 1,000 Units by mouth daily. Reported on 08/01/2015    [provider]  gabapentin (NEURONTIN) 300 MG capsule Take 1 capsule (300 mg total) by mouth 2 (two) times daily. 04/07/18   Dettinger, Fransisca Kaufmann, MD  Glucosamine-Chondroitin-MSM [phone] MG TABS Take by mouth.    [provider]  Multiple Vitamins-Minerals ([employer] SPECTRAVITE ADULT 50+ PO) Take 1 tablet by mouth daily. Reported on 08/01/2015    [provider]    omeprazole (PRILOSEC) 40 MG capsule Take 1 capsule (40 mg total) by mouth daily. 04/07/18   Dettinger, Fransisca Kaufmann, MD    Family History Family History  Problem Relation Age of Onset  . Congestive Heart Failure Father   . Congestive Heart Failure Mother   . AAA (abdominal aortic aneurysm) Mother   . COPD Mother   . Macular degeneration Mother   . Heart attack Brother 62  . Arthritis Brother   . Multiple sclerosis Son     Social History Social History   Tobacco Use  . Smoking status: Never Smoker  . Smokeless tobacco: Never Used  Substance Use Topics  . Alcohol use: No  . Drug use: No     Allergies   Penicillins and Sulfonamide derivatives   Review of Systems Review of Systems  All other systems reviewed and are negative.    Physical Exam Updated Vital Signs BP 137/82   Pulse (!) 51   Temp (!) 97.4 F (36.3 C) (Oral)   Resp 15   Ht 1.702 m (5\' 7" )   Wt 90.7 kg   SpO2 100%   BMI 31.32 kg/m   Physical Exam Vitals signs and nursing note reviewed.  Constitutional:      General: He is not in acute distress.    Appearance: He is well-developed.  HENT:     Head: Normocephalic and atraumatic.     Mouth/Throat:     Pharynx: No oropharyngeal exudate.  Eyes:     General: No scleral icterus.       Right eye: No discharge.        Left eye: No discharge.     Conjunctiva/sclera: Conjunctivae normal.     Pupils: Pupils are equal, round, and reactive to light.  Neck:     Musculoskeletal: Normal range of motion and neck supple.     Thyroid: No thyromegaly.     Vascular: No JVD.  Cardiovascular:     Rate and Rhythm: Normal rate and regular rhythm.     Heart sounds: Normal heart sounds. No murmur. No friction rub. No gallop.   Pulmonary:     Effort: Pulmonary effort is normal. No respiratory distress.     Breath sounds: Normal breath sounds. No wheezing or rales.  Abdominal:     General: Bowel sounds are normal. There is no distension.     Palpations: Abdomen is  soft. There is no mass.     Tenderness: There is no abdominal tenderness.  Musculoskeletal: Normal range of motion.        General: No tenderness.  Lymphadenopathy:     Cervical: No cervical adenopathy.  Skin:    General: Skin is warm and dry.     Findings: No erythema or rash.  Neurological:  Mental Status: He is alert.     Coordination: Coordination normal.  Psychiatric:        Behavior: Behavior normal.      ED Treatments / Results  Labs (all labs ordered are listed, but only abnormal results are displayed) Labs Reviewed  BASIC METABOLIC PANEL - Abnormal; Notable for the following components:      Result Value   Glucose, Bld 135 (*)    All other components within normal limits  CBC - Abnormal; Notable for the following components:   Hemoglobin 12.4 (*)    HCT 38.6 (*)    All other components within normal limits  PROTIME-INR  I-STAT TROPONIN, ED  POCT I-STAT TROPONIN I    EKG None  Radiology Dg Chest 2 View  Result Date: 05/04/2018 CLINICAL DATA:  Chest pain. EXAM: CHEST - 2 VIEW COMPARISON:  None. FINDINGS: The heart size and mediastinal contours are within normal limits. Both lungs are clear. The visualized skeletal structures are unremarkable. IMPRESSION: No active cardiopulmonary disease. Electronically Signed   By: Dorise Bullion III M.D   On: 05/04/2018 18:46    Procedures Procedures (including critical care time)  Medications Ordered in ED Medications  sodium chloride flush (NS) 0.9 % injection 3 mL (3 mLs Intravenous Not Given 05/04/18 1814)  aspirin chewable tablet 324 mg (324 mg Oral Given 05/04/18 1844)     Initial Impression / Assessment and Plan / ED Course  I have reviewed the triage vital signs and the nursing notes.  Pertinent labs & imaging results that were available during my care of the patient were reviewed by me and considered in my medical decision making (see chart for details).  Clinical Course as of May 04 1929  Sun May 04, 2018  1910 Opponent negative, INR 1.02, patient is no longer on Coumadin presumably, will confirm with the patient   [BM]    Clinical Course User Index [BM] Noemi Chapel, MD    EKG performed May 04, 2018.  Rate of 70, rhythm is normal sinus rhythm, Axis is left, intervals show a right bundle branch block with no signs of ST elevation or depression.  There is signs of left ventricular hypertrophy with repolarization abnormalities.  The patient's chest pain is disconcerting, he has no signs of acute ischemia but he does have a right bundle branch block which is clouding the ultimate picture here.  He has no known coronary obstructive disease, troponin pending, chest x-ray pending.  Discussed with hospitalist who agrees that the patient would benefit from admission.  Final Clinical Impressions(s) / ED Diagnoses   Final diagnoses:  Chest heaviness  Essential hypertension    ED Discharge Orders    None       Noemi Chapel, MD 05/04/18 1931

## 2018-05-04 NOTE — H&P (Signed)
History and Physical    Shawn Lambert MVE:[id number] DOB: 1940-12-04 DOA: 05/04/2018  PCP: Dettinger, Fransisca Kaufmann, MD   Patient coming from: Home   Chief Complaint: Chest pain, left arm and shoulder pain   HPI: Shawn Lambert is a 78 y.o. male with medical history significant for hypertension, hyperlipidemia, and GERD, now presenting to the emergency department for evaluation of chest pain.  Patient developed severe pain in the left arm that woke him from sleep overnight before resolving fairly quickly without any intervention.  He went back to sleep, but woke with some discomfort in the left side of his chest radiating to his shoulder that has waxed and waned throughout the day.  He describes a vague discomfort just left of center with radiation towards the left shoulder, no associated shortness of breath or nausea, and no diaphoresis.  He denies any recent cough or dyspnea, reports some chronic ankle swelling that is unchanged, and denies any calf tenderness.  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  Denies any abdominal pain or indigestion.  No fevers or chills.  ED Course: Upon arrival to the ED, patient is found to be afebrile, saturating well on room air, bradycardic in the low 50s, and with stable blood pressure.  EKG features a sinus rhythm with RBBB, LVH, and secondary repolarization abnormality.  Chest x-ray is negative for acute cardiopulmonary disease.  Chemistry panel and CBC are unremarkable.  Troponin is normal.  Patient was given 324 mg of aspirin in the ED.  He remains hemodynamically stable, in no apparent distress, and will be observed for further evaluation and management.  Review of Systems:  All other systems reviewed and apart from HPI, are negative.  Past Medical History:  Diagnosis Date  . Anginal pain ([place])   . Arthritis   . Cancer (HCC)    basil cell skin cancer  .  Dysrhythmia    pt. states physician said " he has a irregular heart beat"  . GERD (gastroesophageal reflux disease)   . Hyperlipidemia   . Hypertension   . Kidney stones     Past Surgical History:  Procedure Laterality Date  . EYE SURGERY Right 1965   Prosthetic Eye  . LEG SURGERY Left 12/18/2016   fractured femur  . TOTAL KNEE ARTHROPLASTY    . TOTAL KNEE ARTHROPLASTY Right 07/26/2015   Procedure: RIGHT TOTAL KNEE ARTHROPLASTY;  Surgeon: Paralee Cancel, MD;  Location: [facility];  Service: Orthopedics;  Laterality: Right;     reports that he has never smoked. He has never used smokeless tobacco. He reports that he does not drink alcohol or use drugs.  Allergies  Allergen Reactions  . Penicillins Itching    Has patient had a PCN reaction causing immediate rash, facial/tongue/throat swelling, SOB or lightheadedness with hypotension: no Has patient had a PCN reaction causing severe rash involving mucus membranes or skin necrosis: no Has patient had a PCN reaction that required hospitalization no Has patient had a PCN reaction occurring within the last 10 years: no If all of the above answers are "NO", then may proceed with Cephalosporin use.   . Sulfonamide Derivatives Other (See Comments)    Reaction not known    Family History  Problem Relation Age of Onset  . Congestive Heart Failure Father   . Congestive Heart Failure Mother   . AAA (abdominal aortic aneurysm) Mother   .  COPD Mother   . Macular degeneration Mother   . Heart attack Brother 75  . Arthritis Brother   . Multiple sclerosis Son      Prior to Admission medications   Medication Sig Start Date End Date Taking? Authorizing Provider  acetaminophen (TYLENOL) 325 MG tablet Take 650 mg by mouth every 6 (six) hours as needed.    [provider]  amLODipine (NORVASC) 5 MG tablet Take 1 tablet (5 mg total) by mouth daily. 04/07/18   Dettinger, Fransisca Kaufmann, MD  atorvastatin (LIPITOR) 20 MG tablet TAKE 1 TABLET DAILY  04/07/18   Dettinger, Fransisca Kaufmann, MD  Calcium Carbonate-Vitamin D (CALCIUM-VITAMIN D) 600-200 MG-UNIT CAPS Take 2 tablets by mouth daily. Reported on 08/01/2015    [provider]  cholecalciferol (VITAMIN D) 1000 UNITS tablet Take 1,000 Units by mouth daily. Reported on 08/01/2015    [provider]  gabapentin (NEURONTIN) 300 MG capsule Take 1 capsule (300 mg total) by mouth 2 (two) times daily. 04/07/18   Dettinger, Fransisca Kaufmann, MD  Glucosamine-Chondroitin-MSM [phone] MG TABS Take by mouth.    [provider]  Multiple Vitamins-Minerals (CVS SPECTRAVITE ADULT 50+ PO) Take 1 tablet by mouth daily. Reported on 08/01/2015    [provider]  omeprazole (PRILOSEC) 40 MG capsule Take 1 capsule (40 mg total) by mouth daily. 04/07/18   Dettinger, Fransisca Kaufmann, MD    Physical Exam: Vitals:   05/04/18 1709 05/04/18 1730 05/04/18 1800 05/04/18 1900  BP: (!) 165/91 (!) 145/81 (!) 141/81 137/82  Pulse: 68 (!) 56 (!) 54 (!) 51  Resp: 18 17 13 15   Temp: (!) 97.4 F (36.3 C)     TempSrc: Oral     SpO2: 100% 100% 99% 100%  Weight:      Height:        Constitutional: NAD, calm  Eyes: PERTLA, lids and conjunctivae normal ENMT: Mucous membranes are moist. Posterior pharynx clear of any exudate or lesions.   Neck: normal, supple, no masses, no thyromegaly Respiratory: clear to auscultation bilaterally, no wheezing, no crackles. Normal respiratory effort.   Cardiovascular: S1 & S2 heard, regular rate and rhythm. No extremity edema.   Abdomen: No distension, no tenderness, soft. Bowel sounds normal.  Musculoskeletal: no clubbing / cyanosis. No joint deformity upper and lower extremities.    Skin: no significant rashes, lesions, ulcers. Warm, dry, well-perfused. Neurologic: No gross facial asymmetry. Hearing deficit. Sensation intact. Moving all extremities.  Psychiatric: Alert and oriented x 3. Pleasant and cooperative.    Labs on Admission: I have personally reviewed  following labs and imaging studies  CBC: Recent Labs  Lab 05/04/18 1720  WBC 4.2  HGB 12.4*  HCT 38.6*  MCV 90.8  PLT 128   Basic Metabolic Panel: Recent Labs  Lab 05/04/18 1720  NA 139  K 3.9  CL 106  CO2 26  GLUCOSE 135*  BUN 14  CREATININE 1.09  CALCIUM 9.0   GFR: Estimated Creatinine Clearance: 60.9 mL/min (by C-G formula based on SCr of 1.09 mg/dL). Liver Function Tests: No results for input(s): AST, ALT, ALKPHOS, BILITOT, PROT, ALBUMIN in the last 168 hours. No results for input(s): LIPASE, AMYLASE in the last 168 hours. No results for input(s): AMMONIA in the last 168 hours. Coagulation Profile: Recent Labs  Lab 05/04/18 1720  INR 1.02   Cardiac Enzymes: No results for input(s): CKTOTAL, CKMB, CKMBINDEX, TROPONINI in the last 168 hours. BNP (last 3 results) No results for input(s): PROBNP in the  last 8760 hours. HbA1C: No results for input(s): HGBA1C in the last 72 hours. CBG: No results for input(s): GLUCAP in the last 168 hours. Lipid Profile: No results for input(s): CHOL, HDL, LDLCALC, TRIG, CHOLHDL, LDLDIRECT in the last 72 hours. Thyroid Function Tests: No results for input(s): TSH, T4TOTAL, FREET4, T3FREE, THYROIDAB in the last 72 hours. Anemia Panel: No results for input(s): VITAMINB12, FOLATE, FERRITIN, TIBC, IRON, RETICCTPCT in the last 72 hours. Urine analysis: No results found for: COLORURINE, APPEARANCEUR, LABSPEC, PHURINE, GLUCOSEU, HGBUR, BILIRUBINUR, KETONESUR, PROTEINUR, UROBILINOGEN, NITRITE, LEUKOCYTESUR Sepsis Labs: @LABRCNTIP (procalcitonin:4,lacticidven:4) )No results found for this or any previous visit (from the past 240 hour(s)).   Radiological Exams on Admission: Dg Chest 2 View  Result Date: 05/04/2018 CLINICAL DATA:  Chest pain. EXAM: CHEST - 2 VIEW COMPARISON:  None. FINDINGS: The heart size and mediastinal contours are within normal limits. Both lungs are clear. The visualized skeletal structures are unremarkable.  IMPRESSION: No active cardiopulmonary disease. Electronically Signed   By: Dorise Bullion III M.D   On: 05/04/2018 18:46    EKG: Independently reviewed. Sinus rhythm, RBBB, LVH with repolarization abnormality.   Assessment/Plan   1. Chest pain  - Presents with chest pain   - He has risk factors that include HTN and HLD, initial troponin is normal, CXR unremarkable, EKG with RBBB and LVH with repolarization abnormality - There does not seem to be any change with rest or exertion, arguing against ischemic etiology; most likely MSK though he has HEART score of at least 5  - He was treated with ASA 324 mg in ED  - Continue cardiac monitoring, check serial troponin measurements, repeat EKG, continue ASA and stain, beta-blocker precluded by bradycardia    2. Hypertension  - BP at goal, continue Norvasc    3. Hyperlipidemia  - Continue Lipitor     DVT prophylaxis: Lovenox  Code Status: Full  Family Communication: Discussed with patient  Consults called: None Admission status: Observation     Vianne Bulls, MD [facility] Pager [phone]  If 7PM-7AM, please contact night-coverage [email or web address] Password [facility]  05/04/2018, 7:34 PM

## 2018-05-04 NOTE — ED Triage Notes (Signed)
Pt states he woke up with excruciating left arm pain for "a minute"  Now having left sided chest pain that radiating to this left shoudler

## 2018-05-04 NOTE — ED Notes (Signed)
EKG given to Miller MD. 

## 2018-05-05 ENCOUNTER — Observation Stay (HOSPITAL_BASED_OUTPATIENT_CLINIC_OR_DEPARTMENT_OTHER): Payer: Medicare Other

## 2018-05-05 DIAGNOSIS — Z79899 Other long term (current) drug therapy: Secondary | ICD-10-CM | POA: Diagnosis not present

## 2018-05-05 DIAGNOSIS — Z85828 Personal history of other malignant neoplasm of skin: Secondary | ICD-10-CM | POA: Diagnosis not present

## 2018-05-05 DIAGNOSIS — R079 Chest pain, unspecified: Secondary | ICD-10-CM | POA: Diagnosis not present

## 2018-05-05 DIAGNOSIS — I340 Nonrheumatic mitral (valve) insufficiency: Secondary | ICD-10-CM | POA: Diagnosis not present

## 2018-05-05 DIAGNOSIS — R0789 Other chest pain: Secondary | ICD-10-CM | POA: Diagnosis not present

## 2018-05-05 DIAGNOSIS — Z96651 Presence of right artificial knee joint: Secondary | ICD-10-CM | POA: Diagnosis not present

## 2018-05-05 DIAGNOSIS — E785 Hyperlipidemia, unspecified: Secondary | ICD-10-CM | POA: Diagnosis not present

## 2018-05-05 DIAGNOSIS — I10 Essential (primary) hypertension: Secondary | ICD-10-CM | POA: Diagnosis not present

## 2018-05-05 LAB — ECHOCARDIOGRAM COMPLETE
Height: 67 in
Weight: 3200 oz

## 2018-05-05 LAB — TROPONIN I
Troponin I: <0.03 ng/mL (ref <0.03)
Troponin I: <0.03 ng/mL (ref <0.03)

## 2018-05-05 NOTE — Discharge Summary (Signed)
Physician Discharge Summary  GEROGE GILLIAM NGE:[id number] DOB: Jan 09, 1941 DOA: 05/04/2018  PCP: Dettinger, Fransisca Kaufmann, MD  Admit date: 05/04/2018 Discharge date: 05/06/2018  Admitted From: Home Disposition:  Home  Discharge Condition:Stable CODE STATUS:FULL Diet recommendation:   Brief/Interim Summary: Admission H and P:  Shawn Lambert is a 78 y.o. male with medical history significant for hypertension, hyperlipidemia, and GERD, now presenting to the emergency department for evaluation of chest pain.  Patient developed severe pain in the left arm that woke him from sleep overnight before resolving fairly quickly without any intervention.  He went back to sleep, but woke with some discomfort in the left side of his chest radiating to his shoulder that has waxed and waned throughout the day.  He describes a vague discomfort just left of center with radiation towards the left shoulder, no associated shortness of breath or nausea, and no diaphoresis.  He denies any recent cough or dyspnea, reports some chronic ankle swelling that is unchanged, and denies any calf tenderness.  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  Denies any abdominal pain or indigestion.  No fevers or chills.  Hospital Course: His hospital course remained stable.  His chest pain was very atypical on presentation.  His chest pain resolved after admission.  His troponins remain negative.  EKG did not show any ischemic changes.  Patient underwent echocardiogram which showed low normal systolic function with ejection fraction of 6050 to 55%, dyskinesia of the septal left ventricular segment but no obvious wall motion abnormality.Ejection fraction has remained unchanged on comparision to the echocardiogram exercise stress testing done on 2015. He is stable for discharge to home today.  Following problems were addressed during his  hospitalization:  1. Chest pain  - Presents with chest pain   but now has resolved. - He has risk factors that include HTN and HLD,  troponins are normal, CXR unremarkable, EKG with RBBB and LVH with repolarization abnormality -He had a nuclear stress test in 2015 which was negative for reversible ischemia.  It had shown normal left ventricular function. - There does not seem to be any change with rest or exertion, arguing against ischemic etiology; most likely MSK though he has HEART score of at least 5  - He was treated with ASA 324 mg in ED  - Echocardiogram done today and report as above -Stable for discharge.   2. Hypertension  - BP at goal, continue Norvasc    3. Hyperlipidemia  - Continue Lipitor    Discharge Diagnoses:  Principal Problem:   Chest pain Active Problems:   Hyperlipidemia LDL goal <130   HYPERTENSION, HEART CONTROLLED W/O ASSOC CHF   GERD    Discharge Instructions  Discharge Instructions    Diet - low sodium heart healthy   Complete by:  As directed    Discharge instructions   Complete by:  As directed    1)Follow up with your PCP in a week. 2)Continue taking your home medications.   Increase activity slowly   Complete by:  As directed      Allergies as of 05/05/2018      Reactions   Penicillins Itching   Has patient had a PCN reaction causing immediate rash, facial/tongue/throat swelling, SOB or lightheadedness with hypotension: no Has patient had a PCN reaction causing severe rash involving mucus membranes or skin necrosis: no Has patient had a  PCN reaction that required hospitalization no Has patient had a PCN reaction occurring within the last 10 years: no If all of the above answers are "NO", then may proceed with Cephalosporin use.   Sulfonamide Derivatives Other (See Comments)   Reaction not known      Medication List    TAKE these medications   amLODipine 5 MG tablet Commonly known as:  NORVASC Take 1 tablet (5 mg total) by mouth  daily.   aspirin EC 81 MG tablet Take 81 mg by mouth daily.   atorvastatin 20 MG tablet Commonly known as:  LIPITOR TAKE 1 TABLET DAILY What changed:    how much to take  how to take this  when to take this  additional instructions   Calcium-Vitamin D 600-200 MG-UNIT Caps Take 2 tablets by mouth daily. Reported on 08/01/2015   cholecalciferol 1000 units tablet Commonly known as:  VITAMIN D Take 1,000 Units by mouth daily. Reported on 08/01/2015   CVS SPECTRAVITE ADULT 50+ PO Take 1 tablet by mouth daily. Reported on 08/01/2015   gabapentin 300 MG capsule Commonly known as:  NEURONTIN Take 1 capsule (300 mg total) by mouth 2 (two) times daily.   Glucosamine-Chondroitin-MSM 750-400-375 MG Tabs Take 1 tablet by mouth daily.   omeprazole 40 MG capsule Commonly known as:  PRILOSEC Take 1 capsule (40 mg total) by mouth daily.      Follow-up Information    Dettinger, Fransisca Kaufmann, MD. Schedule an appointment as soon as possible for a visit in 1 week(s).   Specialties:  Family Medicine, Cardiology Contact information: [address] [place] [address] [phone]          Allergies  Allergen Reactions  . Penicillins Itching    Has patient had a PCN reaction causing immediate rash, facial/tongue/throat swelling, SOB or lightheadedness with hypotension: no Has patient had a PCN reaction causing severe rash involving mucus membranes or skin necrosis: no Has patient had a PCN reaction that required hospitalization no Has patient had a PCN reaction occurring within the last 10 years: no If all of the above answers are "NO", then may proceed with Cephalosporin use.   . Sulfonamide Derivatives Other (See Comments)    Reaction not known    Consultations:  None   Procedures/Studies: Dg Chest 2 View  Result Date: 05/04/2018 CLINICAL DATA:  Chest pain. EXAM: CHEST - 2 VIEW COMPARISON:  None. FINDINGS: The heart size and mediastinal contours are within normal limits.  Both lungs are clear. The visualized skeletal structures are unremarkable. IMPRESSION: No active cardiopulmonary disease. Electronically Signed   By: Dorise Bullion III M.D   On: 05/04/2018 18:46      Subjective: Patient seen and examined the bedside this morning.  Remains comfortable.  Hemodynamically stable.  Chest pain has resolved.  Stable for discharge.  Discharge Exam: Vitals:   05/05/18 0514 05/05/18 0829  BP: 131/72 130/70  Pulse: (!) 56   Resp:    Temp: 97.6 F (36.4 C)   SpO2: 98%    Vitals:   05/04/18 2130 05/04/18 2227 05/05/18 0514 05/05/18 0829  BP: 132/73 108/80 131/72 130/70  Pulse: (!) 57 65 (!) 56   Resp: (!) 22     Temp:  (!) 97.4 F (36.3 C) 97.6 F (36.4 C)   TempSrc:  Oral Oral   SpO2: 99% 97% 98%   Weight:      Height:        General: Pt is alert, awake, not  in acute distress Cardiovascular: RRR, S1/S2 +, no rubs, no gallops Respiratory: CTA bilaterally, no wheezing, no rhonchi Abdominal: Soft, NT, ND, bowel sounds + Extremities: no edema, no cyanosis    The results of significant diagnostics from this hospitalization (including imaging, microbiology, ancillary and laboratory) are listed below for reference.     Microbiology: No results found for this or any previous visit (from the past 240 hour(s)).   Labs: BNP (last 3 results) No results for input(s): BNP in the last 8760 hours. Basic Metabolic Panel: Recent Labs  Lab 05/04/18 1720  NA 139  K 3.9  CL 106  CO2 26  GLUCOSE 135*  BUN 14  CREATININE 1.09  CALCIUM 9.0   Liver Function Tests: No results for input(s): AST, ALT, ALKPHOS, BILITOT, PROT, ALBUMIN in the last 168 hours. No results for input(s): LIPASE, AMYLASE in the last 168 hours. No results for input(s): AMMONIA in the last 168 hours. CBC: Recent Labs  Lab 05/04/18 1720  WBC 4.2  HGB 12.4*  HCT 38.6*  MCV 90.8  PLT 222   Cardiac Enzymes: Recent Labs  Lab 05/05/18 0501 05/05/18 1023  TROPONINI <0.03  <0.03   BNP: Invalid input(s): POCBNP CBG: No results for input(s): GLUCAP in the last 168 hours. D-Dimer No results for input(s): DDIMER in the last 72 hours. Hgb A1c No results for input(s): HGBA1C in the last 72 hours. Lipid Profile No results for input(s): CHOL, HDL, LDLCALC, TRIG, CHOLHDL, LDLDIRECT in the last 72 hours. Thyroid function studies No results for input(s): TSH, T4TOTAL, T3FREE, THYROIDAB in the last 72 hours.  Invalid input(s): FREET3 Anemia work up No results for input(s): VITAMINB12, FOLATE, FERRITIN, TIBC, IRON, RETICCTPCT in the last 72 hours. Urinalysis No results found for: COLORURINE, APPEARANCEUR, LABSPEC, [place], GLUCOSEU, HGBUR, BILIRUBINUR, KETONESUR, PROTEINUR, UROBILINOGEN, NITRITE, LEUKOCYTESUR Sepsis Labs Invalid input(s): PROCALCITONIN,  WBC,  LACTICIDVEN Microbiology No results found for this or any previous visit (from the past 240 hour(s)).  Please note: You were cared for by a hospitalist during your hospital stay. Once you are discharged, your primary care physician will handle any further medical issues. Please note that NO REFILLS for any discharge medications will be authorized once you are discharged, as it is imperative that you return to your primary care physician (or establish a relationship with a primary care physician if you do not have one) for your post hospital discharge needs so that they can reassess your need for medications and monitor your lab values.    Time coordinating discharge: 40 minutes  SIGNED:   Shelly Coss, MD  [facility] 05/06/2018, 1:22 PM Pager [phone]  If 7PM-7AM, please contact night-coverage [email or web address] Password [email or web address]

## 2018-05-05 NOTE — Care Management Obs Status (Signed)
[facility] NOTIFICATION   Patient Details  Name: Shawn Lambert MRN: [id number] Date of Birth: 1941-01-12   Medicare Observation Status Notification Given:  Yes    Bethena Roys, RN 05/05/2018, 4:37 PM

## 2018-05-05 NOTE — Progress Notes (Signed)
  Echocardiogram 2D Echocardiogram has been performed.  Shawn Lambert M 05/05/2018, 3:25 PM

## 2018-05-16 ENCOUNTER — Ambulatory Visit (INDEPENDENT_AMBULATORY_CARE_PROVIDER_SITE_OTHER): Payer: Medicare Other | Admitting: Family Medicine

## 2018-05-16 ENCOUNTER — Ambulatory Visit: Payer: Medicare Other | Admitting: Family Medicine

## 2018-05-16 ENCOUNTER — Encounter: Payer: Self-pay | Admitting: Family Medicine

## 2018-05-16 VITALS — BP 150/71 | HR 57 | Temp 96.9°F | Ht 67.0 in | Wt 206.0 lb

## 2018-05-16 DIAGNOSIS — I119 Hypertensive heart disease without heart failure: Secondary | ICD-10-CM

## 2018-05-16 DIAGNOSIS — R0789 Other chest pain: Secondary | ICD-10-CM

## 2018-05-16 MED ORDER — AMLODIPINE BESYLATE 5 MG PO TABS: 7.5000 mg | ORAL_TABLET | Freq: Every day | ORAL | 3 refills | Status: DC

## 2018-05-16 MED ORDER — NITROGLYCERIN 0.4 MG SL SUBL: 0.4000 mg | SUBLINGUAL_TABLET | SUBLINGUAL | 3 refills | Status: DC | PRN

## 2018-05-16 NOTE — Progress Notes (Signed)
BP (!) 150/71   Pulse (!) 57   Temp (!) 96.9 F (36.1 C) (Oral)   Ht 5\' 7"  (1.702 m)   Wt 206 lb (93.4 kg)   BMI 32.26 kg/m    Subjective:    Patient ID: Shawn Lambert, male    DOB: 09/24/1940, 78 y.o.   MRN: [id number]  HPI: Shawn Lambert is a 78 y.o. male presenting on 05/16/2018 for Hospitalization Follow-up (HTN and chest tightness 2/16- [facility] )   HPI Hospital follow-up for chest pain Patient was in the emergency department from 2/16-2/18/2020 for chest pain and chest pressure.  He was transferred from [facility] to [facility] for further evaluation where he had an echocardiogram which showed a normal ejection fraction and heart function.  He had 3 troponins and EKG which did not show any major abnormalities.  He did have a stress in 2015 and with that they were able to conclude that he was finally able to go home.  He says he started experiencing the chest pain on that night before going to the hospital while he was sleeping and woke him from his sleep and it was pain going down into his left arm all the way down to his hand.  He has not experienced this since that time or any shortness of breath or any other chest pains or pressures but he was concerned about it just because he does have a lot more stress right now going on with his daughter-in-law and going through chemotherapy for lymphoma.  He says today he is feeling great except for a little bit of fatigue still but would like to go ahead and get a cardiology referral going.  His blood pressure during that time was up over 200/120 and since leaving the hospital has been still little bit higher than what it was previously in the 150s and 160s over 80s.  Today here in the office is 150/71.  He was also having some headaches when his blood pressure was elevated as well.  Relevant past medical, surgical, family and social history reviewed and updated as indicated. Interim medical history since our last visit reviewed. Allergies and  medications reviewed and updated.  Review of Systems  Constitutional: Negative for chills and fever.  Eyes: Negative for discharge and visual disturbance.  Respiratory: Negative for shortness of breath and wheezing.   Cardiovascular: Positive for chest pain. Negative for leg swelling.  Musculoskeletal: Negative for back pain and gait problem.  Skin: Negative for rash.  Neurological: Positive for headaches. Negative for dizziness, tremors, seizures, weakness, light-headedness and numbness.  All other systems reviewed and are negative.   Per HPI unless specifically indicated above   Allergies as of 05/16/2018      Reactions   Penicillins Itching   Has patient had a PCN reaction causing immediate rash, facial/tongue/throat swelling, SOB or lightheadedness with hypotension: no Has patient had a PCN reaction causing severe rash involving mucus membranes or skin necrosis: no Has patient had a PCN reaction that required hospitalization no Has patient had a PCN reaction occurring within the last 10 years: no If all of the above answers are "NO", then may proceed with Cephalosporin use.   Sulfonamide Derivatives Other (See Comments)   Reaction not known      Medication List       Accurate as of May 16, 2018  3:14 PM. Always use your most recent med list.        amLODipine 5 MG  tablet Commonly known as:  NORVASC Take 1 tablet (5 mg total) by mouth daily.   aspirin EC 81 MG tablet Take 81 mg by mouth daily.   atorvastatin 20 MG tablet Commonly known as:  LIPITOR TAKE 1 TABLET DAILY   Calcium-Vitamin D 600-200 MG-UNIT Caps Take 2 tablets by mouth daily. Reported on 08/01/2015   cholecalciferol 1000 units tablet Commonly known as:  VITAMIN D Take 1,000 Units by mouth daily. Reported on 08/01/2015   CVS SPECTRAVITE ADULT 50+ PO Take 1 tablet by mouth daily. Reported on 08/01/2015   gabapentin 300 MG capsule Commonly known as:  NEURONTIN Take 1 capsule (300 mg total) by  mouth 2 (two) times daily.   Glucosamine-Chondroitin-MSM 750-400-375 MG Tabs Take 1 tablet by mouth daily.   omeprazole 40 MG capsule Commonly known as:  PRILOSEC Take 1 capsule (40 mg total) by mouth daily.          Objective:    BP (!) 150/71   Pulse (!) 57   Temp (!) 96.9 F (36.1 C) (Oral)   Ht 5\' 7"  (1.702 m)   Wt 206 lb (93.4 kg)   BMI 32.26 kg/m   Wt Readings from Last 3 Encounters:  05/16/18 206 lb (93.4 kg)  05/04/18 200 lb (90.7 kg)  04/07/18 205 lb 12.8 oz (93.4 kg)    Physical Exam Vitals signs and nursing note reviewed.  Constitutional:      General: He is not in acute distress.    Appearance: He is well-developed. He is not diaphoretic.  Eyes:     General: No scleral icterus.    Conjunctiva/sclera: Conjunctivae normal.  Neck:     Musculoskeletal: Neck supple.     Thyroid: No thyromegaly.  Cardiovascular:     Rate and Rhythm: Normal rate and regular rhythm.     Heart sounds: Normal heart sounds. No murmur.  Pulmonary:     Effort: Pulmonary effort is normal. No respiratory distress.     Breath sounds: Normal breath sounds. No wheezing or rales.  Musculoskeletal:        General: No swelling or tenderness.  Lymphadenopathy:     Cervical: No cervical adenopathy.  Skin:    General: Skin is warm and dry.     Findings: No rash.  Neurological:     Mental Status: He is alert and oriented to person, place, and time.     Coordination: Coordination normal.  Psychiatric:        Behavior: Behavior normal.         Assessment & Plan:   Problem List Items Addressed This Visit      Cardiovascular and Mediastinum   HYPERTENSION, HEART CONTROLLED W/O ASSOC CHF   Relevant Medications   amLODipine (NORVASC) 5 MG tablet   nitroGLYCERIN (NITROSTAT) 0.4 MG SL tablet   Other Relevant Orders   Ambulatory referral to Cardiology    Other Visit Diagnoses    Atypical chest pain    -  Primary   Relevant Orders   Ambulatory referral to Cardiology        Follow up plan: Return in about 4 weeks (around 06/13/2018), or if symptoms worsen or fail to improve, for Hypertension.  Counseling provided for all of the vaccine components No orders of the defined types were placed in this encounter.   Caryl Pina, MD [facility] 05/16/2018, 3:14 PM

## 2018-05-22 ENCOUNTER — Other Ambulatory Visit: Payer: Self-pay | Admitting: Family Medicine

## 2018-05-22 DIAGNOSIS — M25511 Pain in right shoulder: Secondary | ICD-10-CM

## 2018-05-22 NOTE — Telephone Encounter (Signed)
Last seen 05/16/2018 

## 2018-07-22 ENCOUNTER — Telehealth: Payer: Self-pay | Admitting: Family Medicine

## 2018-07-29 ENCOUNTER — Ambulatory Visit: Payer: Self-pay | Admitting: Cardiology

## 2018-12-13 ENCOUNTER — Other Ambulatory Visit: Payer: Self-pay | Admitting: Family Medicine

## 2018-12-13 DIAGNOSIS — E785 Hyperlipidemia, unspecified: Secondary | ICD-10-CM

## 2018-12-24 DIAGNOSIS — H5202 Hypermetropia, left eye: Secondary | ICD-10-CM | POA: Diagnosis not present

## 2018-12-24 DIAGNOSIS — Z970 Presence of artificial eye: Secondary | ICD-10-CM | POA: Diagnosis not present

## 2018-12-24 DIAGNOSIS — Z135 Encounter for screening for eye and ear disorders: Secondary | ICD-10-CM | POA: Diagnosis not present

## 2018-12-24 DIAGNOSIS — H25812 Combined forms of age-related cataract, left eye: Secondary | ICD-10-CM | POA: Diagnosis not present

## 2019-02-19 ENCOUNTER — Telehealth: Payer: Self-pay | Admitting: Family Medicine

## 2019-02-19 NOTE — Chronic Care Management (AMB) (Signed)
°  Chronic Care Management   Outreach Note  02/19/2019 Name: Shawn Lambert MRN: [id number] DOB: Jul 20, 1940  Referred by: Dettinger, Fransisca Kaufmann, MD Reason for referral : Chronic Care Management (Initial CCM outreach was unsuccessful.)   An unsuccessful telephone outreach was attempted today. The patient was referred to the case management team by for assistance with care management and care coordination.   Follow Up Plan: A HIPPA compliant phone message was left for the patient providing contact information and requesting a return call.  The care management team will reach out to the patient again over the next 7 days.  If patient returns call to provider office, please advise to call [place] [facility] [place], [place] [facility]  [place], [place] [address] Direct Dial: [email or web address]  Website: [email or web address]

## 2019-02-23 NOTE — Chronic Care Management (AMB) (Signed)
°  Chronic Care Management   Outreach Note  02/23/2019 Name: Shawn Lambert MRN: [id number] DOB: [birth year]  Referred by: Dettinger, Fransisca Kaufmann, MD Reason for referral : Chronic Care Management (Initial CCM outreach was unsuccessful.) and Chronic Care Management (Second CCM outreach was unsuccessful.)   A second unsuccessful telephone outreach was attempted today. The patient was referred to the case management team for assistance with care management and care coordination.   Follow Up Plan: A HIPPA compliant phone message was left for the patient providing contact information and requesting a return call.  The care management team will reach out to the patient again over the next 7 days.  If patient returns call to provider office, please advise to call [facility] [place], [place] [facility]  [place], [place] [address] Direct Dial: [email or web address]  Website: [email or web address]

## 2019-03-01 ENCOUNTER — Other Ambulatory Visit: Payer: Self-pay | Admitting: Family Medicine

## 2019-03-01 DIAGNOSIS — E785 Hyperlipidemia, unspecified: Secondary | ICD-10-CM

## 2019-03-02 NOTE — Chronic Care Management (AMB) (Signed)
  Chronic Care Management   Outreach Note  03/02/2019 Name: Shawn Lambert MRN: [id number] DOB: [birth year]  Referred by: Dettinger, Fransisca Kaufmann, MD Reason for referral : Chronic Care Management (Initial CCM outreach was unsuccessful.), Chronic Care Management (Second CCM outreach was unsuccessful.), and Chronic Care Management (Third CCM outreach was unsuccessful.)   Third unsuccessful telephone outreach was attempted today. The patient was referred to the case management team for assistance with care management and care coordination. The patient's primary care provider has been notified of our unsuccessful attempts to make or maintain contact with the patient. The care management team is pleased to engage with this patient at any time in the future should he/she be interested in assistance from the care management team.   Follow Up Plan: The care management team is available to follow up with the patient after provider conversation with the patient regarding recommendation for care management engagement and subsequent re-referral to the care management team.   [place], [place] [facility]  [place], [place] [address] Direct Dial: [email or web address]  Website: [email or web address]

## 2019-04-25 ENCOUNTER — Other Ambulatory Visit: Payer: Self-pay | Admitting: Family Medicine

## 2019-04-25 DIAGNOSIS — K219 Gastro-esophageal reflux disease without esophagitis: Secondary | ICD-10-CM

## 2019-05-16 ENCOUNTER — Other Ambulatory Visit: Payer: Self-pay | Admitting: Family Medicine

## 2019-05-16 DIAGNOSIS — I119 Hypertensive heart disease without heart failure: Secondary | ICD-10-CM

## 2019-05-18 NOTE — Telephone Encounter (Signed)
Dettinger. NTBS LOV 05/16/18

## 2019-05-19 NOTE — Telephone Encounter (Signed)
LM to call and set up appt = 3/2-jhb

## 2019-07-03 ENCOUNTER — Other Ambulatory Visit: Payer: Self-pay | Admitting: Family Medicine

## 2019-07-03 DIAGNOSIS — E785 Hyperlipidemia, unspecified: Secondary | ICD-10-CM

## 2019-07-03 NOTE — Telephone Encounter (Signed)
Dettinger. NTBS LOV 05/16/18

## 2019-07-03 NOTE — Telephone Encounter (Signed)
Left message for patient to call back to schedule an appointment with the provider for refill on his requested medication.

## 2019-07-15 ENCOUNTER — Other Ambulatory Visit: Payer: Self-pay | Admitting: Family Medicine

## 2019-07-15 DIAGNOSIS — E785 Hyperlipidemia, unspecified: Secondary | ICD-10-CM

## 2019-07-15 DIAGNOSIS — I119 Hypertensive heart disease without heart failure: Secondary | ICD-10-CM

## 2019-07-15 NOTE — Telephone Encounter (Signed)
Dettinger. NTBS LOV 05/16/18

## 2019-07-16 NOTE — Telephone Encounter (Signed)
Left detailed message on patients voicemail to contact office to make an appt

## 2019-07-29 DIAGNOSIS — R739 Hyperglycemia, unspecified: Secondary | ICD-10-CM | POA: Insufficient documentation

## 2019-08-07 ENCOUNTER — Telehealth: Payer: Self-pay | Admitting: Family Medicine

## 2019-08-07 ENCOUNTER — Other Ambulatory Visit: Payer: Self-pay | Admitting: Family Medicine

## 2019-08-07 DIAGNOSIS — I119 Hypertensive heart disease without heart failure: Secondary | ICD-10-CM

## 2019-08-07 NOTE — Telephone Encounter (Signed)
Left message , should have refills at pharmacy. Script was done at end of February for 90 days with refills.

## 2019-08-07 NOTE — Telephone Encounter (Signed)
  Prescription Request  08/07/2019  What is the name of the medication or equipment? amLODipine (NORVASC) 5 MG tablet   Have you contacted your pharmacy to request a refill? (if applicable)yes  Which pharmacy would you like this sent to? [facility], pt ran out today has appt with Dr. Keturah Barre on 08/19/19  Patient notified that their request is being sent to the clinical staff for review and that they should receive a response within 2 business days.

## 2019-08-07 NOTE — Telephone Encounter (Signed)
One month refill given verbally to pharmacy of amlodipine. Patient has a follow up appointment with provider.

## 2019-08-07 NOTE — Telephone Encounter (Signed)
me

## 2019-08-19 ENCOUNTER — Ambulatory Visit (INDEPENDENT_AMBULATORY_CARE_PROVIDER_SITE_OTHER): Payer: Medicare Other | Admitting: Family Medicine

## 2019-08-19 ENCOUNTER — Other Ambulatory Visit: Payer: Self-pay

## 2019-08-19 ENCOUNTER — Encounter: Payer: Self-pay | Admitting: Family Medicine

## 2019-08-19 VITALS — BP 125/70 | HR 57 | Temp 97.7°F | Ht 67.0 in | Wt 194.0 lb

## 2019-08-19 DIAGNOSIS — I119 Hypertensive heart disease without heart failure: Secondary | ICD-10-CM

## 2019-08-19 DIAGNOSIS — Z125 Encounter for screening for malignant neoplasm of prostate: Secondary | ICD-10-CM | POA: Diagnosis not present

## 2019-08-19 DIAGNOSIS — Z0000 Encounter for general adult medical examination without abnormal findings: Secondary | ICD-10-CM

## 2019-08-19 DIAGNOSIS — E785 Hyperlipidemia, unspecified: Secondary | ICD-10-CM | POA: Diagnosis not present

## 2019-08-19 DIAGNOSIS — R739 Hyperglycemia, unspecified: Secondary | ICD-10-CM

## 2019-08-19 DIAGNOSIS — K219 Gastro-esophageal reflux disease without esophagitis: Secondary | ICD-10-CM | POA: Diagnosis not present

## 2019-08-19 LAB — GLUCOSE HEMOCUE WAIVED: Glu Hemocue Waived: 113 mg/dL — ABNORMAL HIGH (ref 65–99)

## 2019-08-19 LAB — BAYER DCA HB A1C WAIVED: HB A1C (BAYER DCA - WAIVED): 9.8 % — ABNORMAL HIGH (ref <7.0)

## 2019-08-19 NOTE — Progress Notes (Signed)
BP 125/70   Pulse (!) 57   Temp 97.7 F (36.5 C)   Ht '5\' 7"'  (1.702 m)   Wt 194 lb (88 kg)   SpO2 100%   BMI 30.38 kg/m    Subjective:   Patient ID: Shawn Lambert, male    DOB: 08/26/1940, 79 y.o.   MRN: [id number]  HPI: Shawn Lambert is a 79 y.o. male presenting on 08/19/2019 for Medical Management of Chronic Issues and Hypertension   HPI Well adult exam and physical Patient has been having some episodes of lightheadedness and dizziness and nausea and weakness and then he checked his blood sugar and found out that it was 396. He has admitted that he has been drinking a lot more sweet tea and soft drinks all the the time and has a lot of bread and potatoes and [place] fries. He did check some home blood sugar it was 396 and then made some changes in switch to 0 sugary drinks and his blood sugars been running in the 200s. He does feel little bit better now but was feeling worse when he had all of this.   Hyperlipidemia Patient is coming in for recheck of his hyperlipidemia. The patient is currently taking Lipitor. They deny any issues with myalgias or history of liver damage from it. They deny any focal numbness or weakness or chest pain.   GERD Patient is currently on omeprazole.  She denies any major symptoms or abdominal pain or belching or burping. She denies any blood in her stool or lightheadedness or dizziness.   BPH Patient is coming in for recheck on BPH Symptoms: None currently Medication: None Last PSA: Unknown, will check today  Relevant past medical, surgical, family and social history reviewed and updated as indicated. Interim medical history since our last visit reviewed. Allergies and medications reviewed and updated.  Review of Systems  Constitutional: Negative for chills and fever.  Eyes: Negative for visual disturbance.  Respiratory: Negative for shortness of breath and wheezing.   Cardiovascular: Negative for chest pain and leg swelling.  Musculoskeletal:  Negative for back pain and gait problem.  Skin: Negative for rash.  Neurological: Negative for dizziness, weakness and light-headedness.  All other systems reviewed and are negative.   Per HPI unless specifically indicated above   Allergies as of 08/19/2019      Reactions   Penicillins Itching   Has patient had a PCN reaction causing immediate rash, facial/tongue/throat swelling, SOB or lightheadedness with hypotension: no Has patient had a PCN reaction causing severe rash involving mucus membranes or skin necrosis: no Has patient had a PCN reaction that required hospitalization no Has patient had a PCN reaction occurring within the last 10 years: no If all of the above answers are "NO", then may proceed with Cephalosporin use.   Sulfonamide Derivatives Other (See Comments)   Reaction not known      Medication List       Accurate as of August 19, 2019  2:46 PM. If you have any questions, ask your nurse or doctor.        STOP taking these medications   cyclobenzaprine 10 MG tablet Commonly known as: FLEXERIL Stopped by: Fransisca Kaufmann Shanyah Gattuso, MD   nitroGLYCERIN 0.4 MG SL tablet Commonly known as: NITROSTAT Stopped by: Fransisca Kaufmann Yosmar Ryker, MD     TAKE these medications   amLODipine 5 MG tablet Commonly known as: NORVASC TAKE 1.5 TABLETS (7.5 MG TOTAL) BY MOUTH DAILY.   aspirin EC  81 MG tablet Take 81 mg by mouth daily.   atorvastatin 20 MG tablet Commonly known as: LIPITOR TAKE 1 TABLET DAILY.  Needs to be seen for future refills.   Calcium-Vitamin D 600-200 MG-UNIT Caps Take 2 tablets by mouth daily. Reported on 08/01/2015   cholecalciferol 1000 units tablet Commonly known as: VITAMIN D Take 1,000 Units by mouth daily. Reported on 08/01/2015   [facility] SPECTRAVITE ADULT 50+ PO Take 1 tablet by mouth daily. Reported on 08/01/2015   gabapentin 300 MG capsule Commonly known as: NEURONTIN TAKE 1 CAPSULE TWICE DAILY   Glucosamine-Chondroitin-MSM 750-400-375 MG Tabs Take 1  tablet by mouth daily.   omeprazole 40 MG capsule Commonly known as: PRILOSEC TAKE 1 CAPSULE BY MOUTH EVERY DAY        Objective:   BP 125/70   Pulse (!) 57   Temp 97.7 F (36.5 C)   Ht '5\' 7"'  (1.702 m)   Wt 194 lb (88 kg)   SpO2 100%   BMI 30.38 kg/m   Wt Readings from Last 3 Encounters:  08/19/19 194 lb (88 kg)  05/16/18 206 lb (93.4 kg)  05/04/18 200 lb (90.7 kg)    Physical Exam Vitals and nursing note reviewed.  Constitutional:      General: He is not in acute distress.    Appearance: He is well-developed. He is not diaphoretic.  Eyes:     General: No scleral icterus.    Conjunctiva/sclera: Conjunctivae normal.  Neck:     Thyroid: No thyromegaly.  Cardiovascular:     Rate and Rhythm: Normal rate and regular rhythm.     Heart sounds: Normal heart sounds. No murmur.  Pulmonary:     Effort: Pulmonary effort is normal. No respiratory distress.     Breath sounds: Normal breath sounds. No wheezing.  Musculoskeletal:        General: Normal range of motion.     Cervical back: Neck supple.  Lymphadenopathy:     Cervical: No cervical adenopathy.  Skin:    General: Skin is warm and dry.     Findings: No rash.  Neurological:     Mental Status: He is alert and oriented to person, place, and time.     Coordination: Coordination normal.  Psychiatric:        Behavior: Behavior normal.       Assessment & Plan:   Problem List Items Addressed This Visit      Cardiovascular and Mediastinum   HYPERTENSION, HEART CONTROLLED W/O ASSOC CHF   Relevant Orders   CBC with Differential/Platelet   CMP14+EGFR   Lipid panel   PSA, total and free     Digestive   GERD     Other   Hyperlipidemia LDL goal <130   Relevant Orders   CBC with Differential/Platelet   CMP14+EGFR   Lipid panel    Other Visit Diagnoses    Well adult exam    -  Primary   Elevated blood sugar level       Relevant Orders   Bayer DCA Hb A1c Waived   Glucose Hemocue Waived      Continue  current medication, will check blood sugars and A1c and see if we need to start something, he sounds like he is feeling better but we may need to start medication to help while he is change in diet, discussed dietary changes for diabetes. Follow up plan: Return in about 6 months (around 02/18/2020), or if symptoms worsen or fail to improve,  for Recheck hypertension and cholesterol.  Counseling provided for all of the vaccine components No orders of the defined types were placed in this encounter.   Caryl Pina, MD [facility] 08/19/2019, 2:46 PM

## 2019-08-20 LAB — CMP14+EGFR
ALT: 22 IU/L (ref 0–44)
AST: 21 IU/L (ref 0–40)
Albumin/Globulin Ratio: 1.9 (ref 1.2–2.2)
Albumin: 4.5 g/dL (ref 3.7–4.7)
Alkaline Phosphatase: 106 IU/L (ref 48–121)
BUN/Creatinine Ratio: 14 (ref 10–24)
BUN: 20 mg/dL (ref 8–27)
Bilirubin Total: 0.6 mg/dL (ref 0.0–1.2)
CO2: 24 mmol/L (ref 20–29)
Calcium: 9.5 mg/dL (ref 8.6–10.2)
Chloride: 106 mmol/L (ref 96–106)
Creatinine, Ser: 1.41 mg/dL — ABNORMAL HIGH (ref 0.76–1.27)
GFR calc Af Amer: 55 mL/min/{1.73_m2} — ABNORMAL LOW (ref >59)
GFR calc non Af Amer: 47 mL/min/{1.73_m2} — ABNORMAL LOW (ref >59)
Globulin, Total: 2.4 g/dL (ref 1.5–4.5)
Glucose: 108 mg/dL — ABNORMAL HIGH (ref 65–99)
Potassium: 4.2 mmol/L (ref 3.5–5.2)
Sodium: 143 mmol/L (ref 134–144)
Total Protein: 6.9 g/dL (ref 6.0–8.5)

## 2019-08-20 LAB — CBC WITH DIFFERENTIAL/PLATELET
Basophils Absolute: 0 10*3/uL (ref 0.0–0.2)
Basos: 0 %
EOS (ABSOLUTE): 0.1 10*3/uL (ref 0.0–0.4)
Eos: 2 %
Hematocrit: 38.5 % (ref 37.5–51.0)
Hemoglobin: 12.5 g/dL — ABNORMAL LOW (ref 13.0–17.7)
Immature Grans (Abs): 0 10*3/uL (ref 0.0–0.1)
Immature Granulocytes: 0 %
Lymphocytes Absolute: 0.8 10*3/uL (ref 0.7–3.1)
Lymphs: 23 %
MCH: 30.2 pg (ref 26.6–33.0)
MCHC: 32.5 g/dL (ref 31.5–35.7)
MCV: 93 fL (ref 79–97)
Monocytes Absolute: 0.5 10*3/uL (ref 0.1–0.9)
Monocytes: 12 %
Neutrophils Absolute: 2.3 10*3/uL (ref 1.4–7.0)
Neutrophils: 63 %
Platelets: 223 10*3/uL (ref 150–450)
RBC: 4.14 x10E6/uL (ref 4.14–5.80)
RDW: 13.1 % (ref 11.6–15.4)
WBC: 3.7 10*3/uL (ref 3.4–10.8)

## 2019-08-20 LAB — LIPID PANEL
Chol/HDL Ratio: 3.4 ratio (ref 0.0–5.0)
Cholesterol, Total: 150 mg/dL (ref 100–199)
HDL: 44 mg/dL (ref >39)
LDL Chol Calc (NIH): 95 mg/dL (ref 0–99)
Triglycerides: 49 mg/dL (ref 0–149)
VLDL Cholesterol Cal: 11 mg/dL (ref 5–40)

## 2019-08-20 LAB — PSA, TOTAL AND FREE
PSA, Free Pct: 36.2 %
PSA, Free: 0.47 ng/mL
Prostate Specific Ag, Serum: 1.3 ng/mL (ref 0.0–4.0)

## 2019-08-24 ENCOUNTER — Other Ambulatory Visit: Payer: Self-pay | Admitting: Family Medicine

## 2019-08-24 MED ORDER — METFORMIN HCL 500 MG PO TABS: 500.0000 mg | ORAL_TABLET | Freq: Two times a day (BID) | ORAL | 3 refills | Status: DC

## 2019-09-02 ENCOUNTER — Other Ambulatory Visit: Payer: Self-pay

## 2019-09-02 ENCOUNTER — Ambulatory Visit (INDEPENDENT_AMBULATORY_CARE_PROVIDER_SITE_OTHER): Payer: Medicare Other | Admitting: Pharmacist

## 2019-09-02 DIAGNOSIS — E785 Hyperlipidemia, unspecified: Secondary | ICD-10-CM | POA: Diagnosis not present

## 2019-09-02 NOTE — Progress Notes (Signed)
    09/02/2019 Name: Shawn Lambert MRN: [id number] DOB: [birth year]   S:  43 yoM presents for diabetes evaluation, education, and management Patient was referred and last seen by Primary Care Provider on 08/19/19.  Insurance coverage/medication affordability: [employer]  Patient reports adherence with medications. . Current diabetes medications include: metformin . Current hypertension medications include: amlodipine Goal 130/80 . Current hyperlipidemia medications include: atorvastatin   Patient denies hypoglycemic events.   Patient reported dietary habits: Eats 2-3 meals/day  Cut out soft drinks  Makes tea at home with splenda  Decreased his bread/sugar intake  Does enjoy a lot of fruit  Discussed meal planning options and Plate method for healthy eating  Avoid sugary drinks and desserts  Incorporate balanced protein, non starchy veggies, 1 serving of carbohydrate  Increase water intake  Increase physical activity as able.  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  Patient reports neuropathy (nerve pain). On gabapentin  Patient denies visual changes.  Patient reports self foot exams.    O:  Lab Results  Component Value Date   HGBA1C 9.8 (H) 08/19/2019    Lipid Panel     Component Value Date/Time   CHOL 150 08/19/2019 1545   TRIG 49 08/19/2019 1545   HDL 44 08/19/2019 1545   CHOLHDL 3.4 08/19/2019 1545   [place] 95 08/19/2019 1545     Home fasting blood sugars: n/a  2 hour post-meal/random blood sugars: n/a.   A/P:  Diabetes T2DM currently uncontrolled. Patient is able to verbalize appropriate hypoglycemia management plan. Patient is adherent with medication. Control is suboptimal due to diet.  -Continue metformin  -Consider SGLT2-I . Awaiting renal labs today  -Extensively discussed pathophysiology of diabetes, recommended lifestyle interventions, dietary  effects on blood sugar control  -Counseled on s/sx of and management of hypoglycemia  -Next A1C anticipated 02/2020    Written patient instructions provided.  Total time in face to face counseling 30 minutes.   Follow up Pharmacist televisit in 3 weeks   Regina Eck, PharmD, BCPS Clinical Pharmacist, [place]  [facility] Phone [phone]

## 2019-09-03 LAB — BMP8+EGFR
BUN/Creatinine Ratio: 13 (ref 10–24)
BUN: 16 mg/dL (ref 8–27)
CO2: 21 mmol/L (ref 20–29)
Calcium: 9.3 mg/dL (ref 8.6–10.2)
Chloride: 104 mmol/L (ref 96–106)
Creatinine, Ser: 1.28 mg/dL — ABNORMAL HIGH (ref 0.76–1.27)
GFR calc Af Amer: 62 mL/min/{1.73_m2} (ref >59)
GFR calc non Af Amer: 53 mL/min/{1.73_m2} — ABNORMAL LOW (ref >59)
Glucose: 103 mg/dL — ABNORMAL HIGH (ref 65–99)
Potassium: 4.6 mmol/L (ref 3.5–5.2)
Sodium: 143 mmol/L (ref 134–144)

## 2019-09-14 ENCOUNTER — Encounter: Payer: Self-pay | Admitting: Pharmacist

## 2019-10-01 ENCOUNTER — Ambulatory Visit (INDEPENDENT_AMBULATORY_CARE_PROVIDER_SITE_OTHER): Payer: Medicare Other | Admitting: Pharmacist

## 2019-10-01 ENCOUNTER — Other Ambulatory Visit: Payer: Self-pay

## 2019-10-01 ENCOUNTER — Telehealth: Payer: Self-pay | Admitting: Pharmacist

## 2019-10-01 DIAGNOSIS — E119 Type 2 diabetes mellitus without complications: Secondary | ICD-10-CM | POA: Diagnosis not present

## 2019-10-01 MED ORDER — GLYXAMBI 25-5 MG PO TABS: 1.0000 | ORAL_TABLET | Freq: Every day | ORAL | 2 refills | Status: DC

## 2019-10-01 NOTE — Telephone Encounter (Signed)
Patient needs renewed Handicap tag for his car Just FYI Thank you!

## 2019-10-01 NOTE — Telephone Encounter (Signed)
Does he need [place] to do the Handicap form now and call him when it is done?

## 2019-10-01 NOTE — Progress Notes (Signed)
    10/01/2019 Name: Shawn Lambert MRN: [id number] DOB: May 26, 1940   S:  80 yoM presents for diabetes evaluation, education, and management.  Wife has passed away and patient is helping with his family members that are having difficulties.  He has recently lost 20 lbs.   Patient was referred and last seen by Primary Care Provider on 08/19/19.  Insurance coverage/medication affordability: Medicare  Patient reports adherence with medications. . Current diabetes medications include: metformin . Current hypertension medications include: amlodipine Goal 130/80 . Current hyperlipidemia medications include: atorvastatin  Patient denies hypoglycemic events.   Patient reported dietary habits: Eats 2-3 meals/day Discussed meal planning options and Plate method for healthy eating . Avoid sugary drinks and desserts . Incorporate balanced protein, non starchy veggies, 1 serving of carbohydrate with each meal . Increase water intake . Increase physical activity as able  Patients admits to eating more carbohydrates (bread, french fries, etc)  Patient-reported exercise habits: n/a  Patient reports nocturia (nighttime urination).  Patient reports neuropathy (nerve pain).  Patient denies visual changes.  Patient reports self foot exams.    O:  Lab Results  Component Value Date   HGBA1C 9.8 (H) 08/19/2019   Lipid Panel     Component Value Date/Time   CHOL 150 08/19/2019 1545   TRIG 49 08/19/2019 1545   HDL 44 08/19/2019 1545   CHOLHDL 3.4 08/19/2019 1545   LDLCALC 95 08/19/2019 1545    Home fasting blood sugars: 117-200  2 hour post-meal/random blood sugars: n/a.   A/P:  Diabetes T2DM currently uncontrolled. Patient is able to verbalize appropriate hypoglycemia management plan. Patient is adherent with medication. Control is suboptimal due to diet.  -Decrease metformin to once daily due to side effects (will call in XR formulation)  -Start Gylxambi 10/5mg  daily  Sample  given-->LOT#[id number] C, EXP 3/22, #21TABLETS  RX send to PCP to sign, will increase patient to 25/5mg  after samples complete (1 tablet daily)  Encouraged patient to stay hydrated  Counseled Sick day rules: if sick, vomiting, have diarrhea, or cannot drink enough fluids, you should stop taking SGLT-2 inhibitors until your symptoms go away. In rare cases, these medicines can cause diabetic ketoacidosis (DKA). DKA is acid buildup in the  blood.  -Patient not on ACEi or ARB--will address at next visit  -Extensively discussed pathophysiology of diabetes, recommended lifestyle interventions, dietary effects on blood sugar control  -Counseled on s/sx of and management of hypoglycemia  -Next A1C anticipated 11/2019.   Written patient instructions provided.  Total time in face to face counseling 30 minutes.   Follow up Pharmacist Clinic Visit  In 2-3 months Follow up PCP visit in 02/2020.    Regina Eck, PharmD, BCPS Clinical Pharmacist, [place]  [facility] Phone [phone]

## 2019-10-01 NOTE — Telephone Encounter (Signed)
Handicap placard form filled out and placed on providers desk for signature. Will call pt once form is signed.

## 2019-10-01 NOTE — Telephone Encounter (Signed)
Call him when done :) Thank you!!

## 2019-10-08 ENCOUNTER — Encounter: Payer: Self-pay | Admitting: Pharmacist

## 2019-10-08 MED ORDER — METFORMIN HCL ER 500 MG PO TB24: 500.0000 mg | ORAL_TABLET | Freq: Every day | ORAL | 1 refills | Status: DC

## 2019-10-16 DIAGNOSIS — Z0289 Encounter for other administrative examinations: Secondary | ICD-10-CM

## 2019-10-29 ENCOUNTER — Other Ambulatory Visit: Payer: Self-pay | Admitting: Family Medicine

## 2019-10-29 DIAGNOSIS — I119 Hypertensive heart disease without heart failure: Secondary | ICD-10-CM

## 2019-11-11 ENCOUNTER — Ambulatory Visit: Payer: Self-pay | Admitting: Pharmacist

## 2020-02-18 ENCOUNTER — Ambulatory Visit (INDEPENDENT_AMBULATORY_CARE_PROVIDER_SITE_OTHER): Payer: Medicare Other | Admitting: Family Medicine

## 2020-02-18 ENCOUNTER — Other Ambulatory Visit: Payer: Self-pay

## 2020-02-18 ENCOUNTER — Encounter: Payer: Self-pay | Admitting: Family Medicine

## 2020-02-18 VITALS — BP 144/76 | HR 58 | Temp 97.0°F | Ht 67.0 in | Wt 172.0 lb

## 2020-02-18 DIAGNOSIS — I10 Essential (primary) hypertension: Secondary | ICD-10-CM | POA: Diagnosis not present

## 2020-02-18 DIAGNOSIS — Z23 Encounter for immunization: Secondary | ICD-10-CM | POA: Diagnosis not present

## 2020-02-18 DIAGNOSIS — E119 Type 2 diabetes mellitus without complications: Secondary | ICD-10-CM

## 2020-02-18 DIAGNOSIS — E785 Hyperlipidemia, unspecified: Secondary | ICD-10-CM

## 2020-02-18 LAB — CBC WITH DIFFERENTIAL/PLATELET
Basophils Absolute: 0 10*3/uL (ref 0.0–0.2)
Basos: 1 %
EOS (ABSOLUTE): 0.2 10*3/uL (ref 0.0–0.4)
Eos: 4 %
Hematocrit: 37.3 % — ABNORMAL LOW (ref 37.5–51.0)
Hemoglobin: 12.2 g/dL — ABNORMAL LOW (ref 13.0–17.7)
Immature Grans (Abs): 0 10*3/uL (ref 0.0–0.1)
Immature Granulocytes: 0 %
Lymphocytes Absolute: 0.8 10*3/uL (ref 0.7–3.1)
Lymphs: 20 %
MCH: 29.8 pg (ref 26.6–33.0)
MCHC: 32.7 g/dL (ref 31.5–35.7)
MCV: 91 fL (ref 79–97)
Monocytes Absolute: 0.4 10*3/uL (ref 0.1–0.9)
Monocytes: 10 %
Neutrophils Absolute: 2.6 10*3/uL (ref 1.4–7.0)
Neutrophils: 65 %
Platelets: 236 10*3/uL (ref 150–450)
RBC: 4.1 x10E6/uL — ABNORMAL LOW (ref 4.14–5.80)
RDW: 13.3 % (ref 11.6–15.4)
WBC: 3.9 10*3/uL (ref 3.4–10.8)

## 2020-02-18 LAB — CMP14+EGFR
ALT: 14 IU/L (ref 0–44)
AST: 15 IU/L (ref 0–40)
Albumin/Globulin Ratio: 1.8 (ref 1.2–2.2)
Albumin: 3.9 g/dL (ref 3.7–4.7)
Alkaline Phosphatase: 93 IU/L (ref 44–121)
BUN/Creatinine Ratio: 10 (ref 10–24)
BUN: 11 mg/dL (ref 8–27)
Bilirubin Total: 0.4 mg/dL (ref 0.0–1.2)
CO2: 25 mmol/L (ref 20–29)
Calcium: 9.4 mg/dL (ref 8.6–10.2)
Chloride: 105 mmol/L (ref 96–106)
Creatinine, Ser: 1.05 mg/dL (ref 0.76–1.27)
GFR calc Af Amer: 78 mL/min/{1.73_m2} (ref >59)
GFR calc non Af Amer: 68 mL/min/{1.73_m2} (ref >59)
Globulin, Total: 2.2 g/dL (ref 1.5–4.5)
Glucose: 116 mg/dL — ABNORMAL HIGH (ref 65–99)
Potassium: 4.5 mmol/L (ref 3.5–5.2)
Sodium: 144 mmol/L (ref 134–144)
Total Protein: 6.1 g/dL (ref 6.0–8.5)

## 2020-02-18 LAB — LIPID PANEL
Chol/HDL Ratio: 3.4 ratio (ref 0.0–5.0)
Cholesterol, Total: 170 mg/dL (ref 100–199)
HDL: 50 mg/dL (ref >39)
LDL Chol Calc (NIH): 111 mg/dL — ABNORMAL HIGH (ref 0–99)
Triglycerides: 41 mg/dL (ref 0–149)
VLDL Cholesterol Cal: 9 mg/dL (ref 5–40)

## 2020-02-18 LAB — BAYER DCA HB A1C WAIVED: HB A1C (BAYER DCA - WAIVED): 6.5 % (ref <7.0)

## 2020-02-18 MED ORDER — LISINOPRIL 10 MG PO TABS: 10.0000 mg | ORAL_TABLET | Freq: Every day | ORAL | 3 refills | Status: DC

## 2020-02-18 MED ORDER — ATORVASTATIN CALCIUM 20 MG PO TABS: 20.0000 mg | ORAL_TABLET | Freq: Every day | ORAL | 3 refills | Status: DC

## 2020-02-18 NOTE — Progress Notes (Signed)
BP (!) 144/76   Pulse (!) 58   Temp (!) 97 F (36.1 C)   Ht '5\' 7"'  (1.702 m)   Wt 172 lb (78 kg)   SpO2 100%   BMI 26.94 kg/m    Subjective:   Patient ID: Shawn Lambert, male    DOB: 07-07-1940, 79 y.o.   MRN: [id number]  HPI: Shawn Lambert is a 79 y.o. male presenting on 02/18/2020 for Medical Management of Chronic Issues, Hypertension, Hyperlipidemia, and Diabetes (Has lost 30lb since last visit. He is not taking the diabetic meds due to side effects)   HPI Type 2 diabetes mellitus Patient comes in today for recheck of his diabetes. Patient has been currently taking no medication, stopped both Metformin and Glyxambi because of side effects, A1c 6.5 today. Patient is currently on an ACE inhibitor/ARB. Patient has not seen an ophthalmologist this year. Patient denies any issues with their feet. The symptom started onset as an adult hypertension hyperlipidemia ARE RELATED TO DM   Hypertension Patient is currently on lisinopril and amlodipine, and their blood pressure today is 144/76. Patient denies any lightheadedness or dizziness. Patient denies headaches, blurred vision, chest pains, shortness of breath, or weakness. Denies any side effects from medication and is content with current medication.   Hyperlipidemia Patient is coming in for recheck of his hyperlipidemia. The patient is currently taking atorvastatin. They deny any issues with myalgias or history of liver damage from it. They deny any focal numbness or weakness or chest pain.   Relevant past medical, surgical, family and social history reviewed and updated as indicated. Interim medical history since our last visit reviewed. Allergies and medications reviewed and updated.  Review of Systems  Constitutional: Negative for chills and fever.  Respiratory: Negative for shortness of breath and wheezing.   Cardiovascular: Negative for chest pain and leg swelling.  Musculoskeletal: Negative for back pain and gait problem.    Skin: Negative for rash.  All other systems reviewed and are negative.   Per HPI unless specifically indicated above   Allergies as of 02/18/2020      Reactions   Penicillins Itching   Has patient had a PCN reaction causing immediate rash, facial/tongue/throat swelling, SOB or lightheadedness with hypotension: no Has patient had a PCN reaction causing severe rash involving mucus membranes or skin necrosis: no Has patient had a PCN reaction that required hospitalization no Has patient had a PCN reaction occurring within the last 10 years: no If all of the above answers are "NO", then may proceed with Cephalosporin use.   Sulfonamide Derivatives Other (See Comments)   Reaction not known      Medication List       Accurate as of February 18, 2020  9:57 AM. If you have any questions, ask your nurse or doctor.        STOP taking these medications   gabapentin 300 MG capsule Commonly known as: NEURONTIN Stopped by: Worthy Rancher, MD   Glyxambi 25-5 MG Tabs Generic drug: Empagliflozin-linaGLIPtin Stopped by: Fransisca Kaufmann Ronna Herskowitz, MD   metFORMIN 500 MG 24 hr tablet Commonly known as: Glucophage XR Stopped by: Fransisca Kaufmann Roby Spalla, MD     TAKE these medications   amLODipine 5 MG tablet Commonly known as: NORVASC TAKE 1.5 TABLETS (7.5 MG TOTAL) BY MOUTH DAILY.   aspirin EC 81 MG tablet Take 81 mg by mouth daily.   atorvastatin 20 MG tablet Commonly known as: LIPITOR Take 1 tablet (20 mg total)  by mouth at bedtime. TAKE 1 TABLET DAILY.  Needs to be seen for future refills. What changed:   how much to take  how to take this  when to take this Changed by: Worthy Rancher, MD   Calcium-Vitamin D 600-200 MG-UNIT Caps Take 2 tablets by mouth daily. Reported on 08/01/2015   [facility] SPECTRAVITE ADULT 50+ PO Take 1 tablet by mouth daily. Reported on 08/01/2015   Glucosamine-Chondroitin-MSM 750-400-375 MG Tabs Take 1 tablet by mouth daily.   lisinopril 10 MG  tablet Commonly known as: ZESTRIL Take 1 tablet (10 mg total) by mouth daily. Started by: Fransisca Kaufmann Nature Vogelsang, MD   omeprazole 40 MG capsule Commonly known as: PRILOSEC TAKE 1 CAPSULE BY MOUTH EVERY DAY   vitamin A 25000 UNIT capsule Take 25,000 Units by mouth daily.        Objective:   BP (!) 144/76   Pulse (!) 58   Temp (!) 97 F (36.1 C)   Ht '5\' 7"'  (1.702 m)   Wt 172 lb (78 kg)   SpO2 100%   BMI 26.94 kg/m   Wt Readings from Last 3 Encounters:  02/18/20 172 lb (78 kg)  08/19/19 194 lb (88 kg)  05/16/18 206 lb (93.4 kg)    Physical Exam Vitals and nursing note reviewed.  Constitutional:      General: He is not in acute distress.    Appearance: He is well-developed. He is not diaphoretic.  Eyes:     General: No scleral icterus.    Conjunctiva/sclera: Conjunctivae normal.  Neck:     Thyroid: No thyromegaly.  Cardiovascular:     Rate and Rhythm: Normal rate and regular rhythm.     Heart sounds: Normal heart sounds. No murmur heard.   Pulmonary:     Effort: Pulmonary effort is normal. No respiratory distress.     Breath sounds: Normal breath sounds. No wheezing.  Musculoskeletal:        General: Normal range of motion.     Cervical back: Neck supple.  Lymphadenopathy:     Cervical: No cervical adenopathy.  Skin:    General: Skin is warm and dry.     Findings: No rash.  Neurological:     Mental Status: He is alert and oriented to person, place, and time.     Coordination: Coordination normal.  Psychiatric:        Behavior: Behavior normal.       Assessment & Plan:   Problem List Items Addressed This Visit      Cardiovascular and Mediastinum   Essential hypertension   Relevant Medications   atorvastatin (LIPITOR) 20 MG tablet   lisinopril (ZESTRIL) 10 MG tablet   Other Relevant Orders   CMP14+EGFR   Lipid panel     Endocrine   T2DM (type 2 diabetes mellitus) ([place]) - Primary   Relevant Medications   atorvastatin (LIPITOR) 20 MG tablet    lisinopril (ZESTRIL) 10 MG tablet   Other Relevant Orders   Bayer DCA Hb A1c Waived   Microalbumin / creatinine urine ratio   CBC with Differential/Platelet   CMP14+EGFR     Other   Hyperlipidemia LDL goal <130   Relevant Medications   atorvastatin (LIPITOR) 20 MG tablet   lisinopril (ZESTRIL) 10 MG tablet   Other Relevant Orders   Lipid panel    Other Visit Diagnoses    Need for Tdap vaccination       Relevant Orders   Tdap vaccine greater than or  equal to 7yo IM (Completed)      Patient's blood sugars been running a lot better, he has been really focusing on diet and came off medicines a couple months ago because of side effects.  Patient had stopped gabapentin on his own and had stopped his blood sugar medicines and A1c is 6.5 so is looking good, continue with diet control. Patient was already on lisinopril and amlodipine, continue those for blood pressure Follow up plan: Return in about 3 months (around 05/18/2020), or if symptoms worsen or fail to improve, for Diabetes recheck.  Counseling provided for all of the vaccine components Orders Placed This Encounter  Procedures  . Tdap vaccine greater than or equal to 7yo IM  . Bayer DCA Hb A1c Waived  . Microalbumin / creatinine urine ratio  . CBC with Differential/Platelet  . CMP14+EGFR  . Lipid panel    Caryl Pina, MD [facility] 02/18/2020, 9:57 AM

## 2020-02-19 LAB — MICROALBUMIN / CREATININE URINE RATIO
Creatinine, Urine: 121.9 mg/dL
Microalb/Creat Ratio: 5 mg/g creat (ref 0–29)
Microalbumin, Urine: 5.5 ug/mL

## 2020-02-22 ENCOUNTER — Telehealth: Payer: Self-pay

## 2020-02-23 ENCOUNTER — Other Ambulatory Visit: Payer: Self-pay

## 2020-02-23 ENCOUNTER — Encounter: Payer: Self-pay | Admitting: Family Medicine

## 2020-02-23 MED ORDER — ATORVASTATIN CALCIUM 40 MG PO TABS: 40.0000 mg | ORAL_TABLET | Freq: Every day | ORAL | 3 refills | Status: DC

## 2020-02-23 NOTE — Telephone Encounter (Signed)
[email or web address]

## 2020-03-22 DIAGNOSIS — Z1211 Encounter for screening for malignant neoplasm of colon: Secondary | ICD-10-CM | POA: Diagnosis not present

## 2020-03-31 LAB — EXTERNAL GENERIC LAB PROCEDURE: COLOGUARD: NEGATIVE

## 2020-03-31 LAB — COLOGUARD: COLOGUARD: NEGATIVE

## 2020-04-08 ENCOUNTER — Encounter: Payer: Self-pay | Admitting: Family Medicine

## 2020-04-08 LAB — COLOGUARD: Cologuard: NEGATIVE

## 2020-04-24 ENCOUNTER — Other Ambulatory Visit: Payer: Self-pay | Admitting: Family Medicine

## 2020-04-24 DIAGNOSIS — I119 Hypertensive heart disease without heart failure: Secondary | ICD-10-CM

## 2020-04-24 DIAGNOSIS — K219 Gastro-esophageal reflux disease without esophagitis: Secondary | ICD-10-CM

## 2020-05-19 ENCOUNTER — Ambulatory Visit (INDEPENDENT_AMBULATORY_CARE_PROVIDER_SITE_OTHER): Payer: Medicare Other | Admitting: Family Medicine

## 2020-05-19 ENCOUNTER — Other Ambulatory Visit: Payer: Self-pay

## 2020-05-19 ENCOUNTER — Encounter: Payer: Self-pay | Admitting: Family Medicine

## 2020-05-19 VITALS — BP 123/76 | HR 59 | Ht 67.0 in | Wt 173.0 lb

## 2020-05-19 DIAGNOSIS — I10 Essential (primary) hypertension: Secondary | ICD-10-CM

## 2020-05-19 DIAGNOSIS — E119 Type 2 diabetes mellitus without complications: Secondary | ICD-10-CM

## 2020-05-19 DIAGNOSIS — K219 Gastro-esophageal reflux disease without esophagitis: Secondary | ICD-10-CM | POA: Diagnosis not present

## 2020-05-19 DIAGNOSIS — E785 Hyperlipidemia, unspecified: Secondary | ICD-10-CM | POA: Diagnosis not present

## 2020-05-19 LAB — BAYER DCA HB A1C WAIVED: HB A1C (BAYER DCA - WAIVED): 6.4 % (ref <7.0)

## 2020-05-19 MED ORDER — OMEPRAZOLE 40 MG PO CPDR: 40.0000 mg | DELAYED_RELEASE_CAPSULE | Freq: Every day | ORAL | 3 refills | Status: DC

## 2020-05-19 MED ORDER — AMLODIPINE BESYLATE 5 MG PO TABS: 7.5000 mg | ORAL_TABLET | Freq: Every day | ORAL | 3 refills | Status: DC

## 2020-05-19 NOTE — Progress Notes (Signed)
BP 123/76   Pulse (!) 59   Ht 5\' 7"  (1.702 m)   Wt 173 lb (78.5 kg)   SpO2 99%   BMI 27.10 kg/m    Subjective:   Patient ID: Shawn Lambert, male    DOB: 1940/09/09, 80 y.o.   MRN: [id number]  HPI: Shawn Lambert is a 80 y.o. male presenting on 05/19/2020 for Medical Management of Chronic Issues, Diabetes, and Hypertension   HPI Type 2 diabetes mellitus Patient comes in today for recheck of his diabetes. Patient has been currently taking no medication currently has been watching diet and A1c 6.4 which is actually improved.. Patient is currently on an ACE inhibitor/ARB. Patient has not seen an ophthalmologist this year. Patient denies any issues with their feet. The symptom started onset as an adult hypertension hyperlipidemia ARE RELATED TO DM   Hypertension Patient is currently on amlodipine and lisinopril, and their blood pressure today is 123/76. Patient denies any lightheadedness or dizziness. Patient denies headaches, blurred vision, chest pains, shortness of breath, or weakness. Denies any side effects from medication and is content with current medication.   Hyperlipidemia Patient is coming in for recheck of his hyperlipidemia. The patient is currently taking Lipitor. They deny any issues with myalgias or history of liver damage from it. They deny any focal numbness or weakness or chest pain.   GERD Patient is currently on omeprazole.  She denies any major symptoms or abdominal pain or belching or burping. She denies any blood in her stool or lightheadedness or dizziness.   Patient does still get some numbness in his fourth and fifth fingers on his right hand, is not losing any grip strength and does not initially want to follow-up on anything with the nerve issue right now but just wanted it noted.  Patient is having some arthritis issues with his right knee and recommended he try Voltaren gel because he has a history of a knee replacement.  Relevant past medical, surgical,  family and social history reviewed and updated as indicated. Interim medical history since our last visit reviewed. Allergies and medications reviewed and updated.  Review of Systems  Constitutional: Negative for chills and fever.  Eyes: Positive for visual disturbance (Patient is getting some floaters recommend to go see an eye doctor). Negative for pain.  Respiratory: Negative for shortness of breath and wheezing.   Cardiovascular: Negative for chest pain and leg swelling.  Musculoskeletal: Positive for arthralgias. Negative for back pain and gait problem.  Skin: Negative for rash.  Neurological: Positive for numbness. Negative for dizziness and weakness.  All other systems reviewed and are negative.   Per HPI unless specifically indicated above   Allergies as of 05/19/2020      Reactions   Penicillins Itching   Has patient had a PCN reaction causing immediate rash, facial/tongue/throat swelling, SOB or lightheadedness with hypotension: no Has patient had a PCN reaction causing severe rash involving mucus membranes or skin necrosis: no Has patient had a PCN reaction that required hospitalization no Has patient had a PCN reaction occurring within the last 10 years: no If all of the above answers are "NO", then may proceed with Cephalosporin use.   Sulfonamide Derivatives Other (See Comments)   Reaction not known      Medication List       Accurate as of May 19, 2020  9:46 AM. If you have any questions, ask your nurse or doctor.        amLODipine 5  MG tablet Commonly known as: NORVASC TAKE 1.5 TABLETS (7.5 MG TOTAL) BY MOUTH DAILY.   aspirin EC 81 MG tablet Take 81 mg by mouth daily.   atorvastatin 40 MG tablet Commonly known as: LIPITOR Take 1 tablet (40 mg total) by mouth daily.   Calcium-Vitamin D 600-200 MG-UNIT Caps Take 2 tablets by mouth daily. Reported on 08/01/2015   [facility]S SPECTRAVITE ADULT 50+ PO Take 1 tablet by mouth daily. Reported on 08/01/2015    Glucosamine-Chondroitin-MSM 750-400-375 MG Tabs Take 1 tablet by mouth daily.   lisinopril 10 MG tablet Commonly known as: ZESTRIL Take 1 tablet (10 mg total) by mouth daily.   omeprazole 40 MG capsule Commonly known as: PRILOSEC TAKE 1 CAPSULE BY MOUTH EVERY DAY   vitamin A 25000 UNIT capsule Take 25,000 Units by mouth daily.        Objective:   BP 123/76   Pulse (!) 59   Ht 5\' 7"  (1.702 m)   Wt 173 lb (78.5 kg)   SpO2 99%   BMI 27.10 kg/m   Wt Readings from Last 3 Encounters:  05/19/20 173 lb (78.5 kg)  02/18/20 172 lb (78 kg)  08/19/19 194 lb (88 kg)    Physical Exam Vitals and nursing note reviewed.  Constitutional:      General: He is not in acute distress.    Appearance: He is well-developed and well-nourished. He is not diaphoretic.  Eyes:     General: No scleral icterus.    Extraocular Movements: EOM normal.     Conjunctiva/sclera: Conjunctivae normal.  Neck:     Thyroid: No thyromegaly.  Cardiovascular:     Rate and Rhythm: Normal rate and regular rhythm.     Pulses: Intact distal pulses.     Heart sounds: Normal heart sounds. No murmur heard.   Pulmonary:     Effort: Pulmonary effort is normal. No respiratory distress.     Breath sounds: Normal breath sounds. No wheezing.  Musculoskeletal:        General: No edema.     Cervical back: Neck supple.  Lymphadenopathy:     Cervical: No cervical adenopathy.  Skin:    General: Skin is warm and dry.     Findings: No rash.  Neurological:     Mental Status: He is alert and oriented to person, place, and time.     Coordination: Coordination normal.  Psychiatric:        Mood and Affect: Mood and affect normal.        Behavior: Behavior normal.       Assessment & Plan:   Problem List Items Addressed This Visit      Cardiovascular and Mediastinum   Essential hypertension   Relevant Medications   amLODipine (NORVASC) 5 MG tablet     Digestive   GERD   Relevant Medications   omeprazole  (PRILOSEC) 40 MG capsule     Endocrine   T2DM (type 2 diabetes mellitus) ([place]) - Primary   Relevant Orders   Bayer DCA Hb A1c Waived     Other   Hyperlipidemia LDL goal <130   Relevant Medications   amLODipine (NORVASC) 5 MG tablet      a1c 6.4 and it looks normal and continue current medicines Follow up plan: Return in about 3 months (around 08/19/2020), or if symptoms worsen or fail to improve, for dm and hld.  Counseling provided for all of the vaccine components Orders Placed This Encounter  Procedures  . [facility]  Hb A1c [place], MD [facility] 05/19/2020, 9:46 AM

## 2020-05-20 LAB — HM DIABETES EYE EXAM

## 2020-05-25 ENCOUNTER — Encounter: Payer: Self-pay | Admitting: *Deleted

## 2020-07-13 DIAGNOSIS — D485 Neoplasm of uncertain behavior of skin: Secondary | ICD-10-CM | POA: Diagnosis not present

## 2020-07-13 DIAGNOSIS — L820 Inflamed seborrheic keratosis: Secondary | ICD-10-CM | POA: Diagnosis not present

## 2020-07-13 DIAGNOSIS — L570 Actinic keratosis: Secondary | ICD-10-CM | POA: Diagnosis not present

## 2020-07-13 DIAGNOSIS — L821 Other seborrheic keratosis: Secondary | ICD-10-CM | POA: Diagnosis not present

## 2020-07-13 DIAGNOSIS — Z85828 Personal history of other malignant neoplasm of skin: Secondary | ICD-10-CM | POA: Diagnosis not present

## 2020-07-13 DIAGNOSIS — Z1283 Encounter for screening for malignant neoplasm of skin: Secondary | ICD-10-CM | POA: Diagnosis not present

## 2020-07-15 DIAGNOSIS — H25812 Combined forms of age-related cataract, left eye: Secondary | ICD-10-CM | POA: Diagnosis not present

## 2020-07-15 DIAGNOSIS — Z01818 Encounter for other preprocedural examination: Secondary | ICD-10-CM | POA: Diagnosis not present

## 2020-07-22 DIAGNOSIS — H2512 Age-related nuclear cataract, left eye: Secondary | ICD-10-CM | POA: Diagnosis not present

## 2020-07-22 DIAGNOSIS — H25812 Combined forms of age-related cataract, left eye: Secondary | ICD-10-CM | POA: Diagnosis not present

## 2020-08-19 ENCOUNTER — Ambulatory Visit: Payer: Medicare Other | Admitting: Family Medicine

## 2020-08-25 ENCOUNTER — Ambulatory Visit: Payer: Medicare Other | Admitting: Family Medicine

## 2020-08-29 ENCOUNTER — Ambulatory Visit: Payer: Medicare Other | Admitting: Family Medicine

## 2020-09-19 DIAGNOSIS — Z20822 Contact with and (suspected) exposure to covid-19: Secondary | ICD-10-CM | POA: Diagnosis not present

## 2020-09-26 ENCOUNTER — Ambulatory Visit: Payer: Medicare Other | Admitting: Family Medicine

## 2020-10-24 ENCOUNTER — Ambulatory Visit: Payer: Medicare Other | Admitting: Family Medicine

## 2020-10-24 ENCOUNTER — Other Ambulatory Visit: Payer: Self-pay

## 2020-10-24 ENCOUNTER — Encounter: Payer: Self-pay | Admitting: Family Medicine

## 2020-10-24 ENCOUNTER — Ambulatory Visit (INDEPENDENT_AMBULATORY_CARE_PROVIDER_SITE_OTHER): Payer: Medicare Other | Admitting: Family Medicine

## 2020-10-24 VITALS — BP 128/70 | HR 56 | Ht 67.0 in | Wt 168.0 lb

## 2020-10-24 DIAGNOSIS — Z23 Encounter for immunization: Secondary | ICD-10-CM

## 2020-10-24 DIAGNOSIS — E119 Type 2 diabetes mellitus without complications: Secondary | ICD-10-CM

## 2020-10-24 DIAGNOSIS — I10 Essential (primary) hypertension: Secondary | ICD-10-CM | POA: Diagnosis not present

## 2020-10-24 DIAGNOSIS — E785 Hyperlipidemia, unspecified: Secondary | ICD-10-CM | POA: Diagnosis not present

## 2020-10-24 LAB — BAYER DCA HB A1C WAIVED: HB A1C (BAYER DCA - WAIVED): 7 % — ABNORMAL HIGH (ref <7.0)

## 2020-10-24 NOTE — Progress Notes (Signed)
BP 128/70   Pulse (!) 56   Ht '5\' 7"'  (1.702 m)   Wt 168 lb (76.2 kg)   SpO2 100%   BMI 26.31 kg/m    Subjective:   Patient ID: Shawn Lambert, male    DOB: Apr 17, 1940, 80 y.o.   MRN: [id number]  HPI: Shawn Lambert is a 80 y.o. male presenting on 10/24/2020 for Medical Management of Chronic Issues, Hypertension, and Diabetes   HPI Type 2 diabetes mellitus Patient comes in today for recheck of his diabetes. Patient has been currently taking no medication currently, A1c 7.0.. Patient is currently on an ACE inhibitor/ARB. Patient has seen an ophthalmologist this year. Patient denies any issues with their feet. The symptom started onset as an adult hypertension and hyperlipidemia ARE RELATED TO DM   Hypertension Patient is currently on lisinopril and amlodipine, and their blood pressure today is 128/70. Patient denies any lightheadedness or dizziness. Patient denies headaches, blurred vision, chest pains, shortness of breath, or weakness. Denies any side effects from medication and is content with current medication.   Hyperlipidemia Patient is coming in for recheck of his hyperlipidemia. The patient is currently taking atorvastatin. They deny any issues with myalgias or history of liver damage from it. They deny any focal numbness or weakness or chest pain.   Relevant past medical, surgical, family and social history reviewed and updated as indicated. Interim medical history since our last visit reviewed. Allergies and medications reviewed and updated.  Review of Systems  Constitutional:  Negative for chills and fever.  Eyes:  Negative for visual disturbance.  Respiratory:  Negative for shortness of breath and wheezing.   Cardiovascular:  Negative for chest pain and leg swelling.  Musculoskeletal:  Negative for back pain and gait problem.  Skin:  Negative for rash.  Neurological:  Negative for dizziness, weakness and light-headedness.  All other systems reviewed and are  negative.  Per HPI unless specifically indicated above   Allergies as of 10/24/2020       Reactions   Penicillins Itching   Has patient had a PCN reaction causing immediate rash, facial/tongue/throat swelling, SOB or lightheadedness with hypotension: no Has patient had a PCN reaction causing severe rash involving mucus membranes or skin necrosis: no Has patient had a PCN reaction that required hospitalization no Has patient had a PCN reaction occurring within the last 10 years: no If all of the above answers are "NO", then may proceed with Cephalosporin use.   Sulfonamide Derivatives Other (See Comments)   Reaction not known        Medication List        Accurate as of October 24, 2020  2:51 PM. If you have any questions, ask your nurse or doctor.          amLODipine 5 MG tablet Commonly known as: NORVASC Take 1.5 tablets (7.5 mg total) by mouth daily.   aspirin EC 81 MG tablet Take 81 mg by mouth daily.   atorvastatin 40 MG tablet Commonly known as: LIPITOR Take 1 tablet (40 mg total) by mouth daily.   Calcium-Vitamin D 600-200 MG-UNIT Caps Take 2 tablets by mouth daily. Reported on 08/01/2015   [facility]S SPECTRAVITE ADULT 50+ PO Take 1 tablet by mouth daily. Reported on 08/01/2015   Glucosamine-Chondroitin-MSM 750-400-375 MG Tabs Take 1 tablet by mouth daily.   lisinopril 10 MG tablet Commonly known as: ZESTRIL Take 1 tablet (10 mg total) by mouth daily.   omeprazole 40 MG capsule Commonly known  as: PRILOSEC Take 1 capsule (40 mg total) by mouth daily.   vitamin A 25000 UNIT capsule Take 25,000 Units by mouth daily.         Objective:   BP 128/70   Pulse (!) 56   Ht '5\' 7"'  (1.702 m)   Wt 168 lb (76.2 kg)   SpO2 100%   BMI 26.31 kg/m   Wt Readings from Last 3 Encounters:  10/24/20 168 lb (76.2 kg)  05/19/20 173 lb (78.5 kg)  02/18/20 172 lb (78 kg)    Physical Exam Vitals and nursing note reviewed.  Constitutional:      General: He is not in  acute distress.    Appearance: He is well-developed. He is not diaphoretic.  Eyes:     General: No scleral icterus.    Conjunctiva/sclera: Conjunctivae normal.  Neck:     Thyroid: No thyromegaly.  Cardiovascular:     Rate and Rhythm: Normal rate and regular rhythm.     Heart sounds: Normal heart sounds. No murmur heard. Pulmonary:     Effort: Pulmonary effort is normal. No respiratory distress.     Breath sounds: Normal breath sounds. No wheezing.  Musculoskeletal:        General: Normal range of motion.     Cervical back: Neck supple.  Lymphadenopathy:     Cervical: No cervical adenopathy.  Skin:    General: Skin is warm and dry.     Findings: No rash.  Neurological:     Mental Status: He is alert and oriented to person, place, and time.     Coordination: Coordination normal.  Psychiatric:        Behavior: Behavior normal.    Results for orders placed or performed in visit on 05/23/20  HM DIABETES EYE EXAM  Result Value Ref Range   HM Diabetic Eye Exam No Retinopathy No Retinopathy    Assessment & Plan:   Problem List Items Addressed This Visit       Cardiovascular and Mediastinum   Essential hypertension   Relevant Orders   CBC with Differential/Platelet   CMP14+EGFR   Lipid panel   Bayer DCA Hb A1c Waived     Endocrine   T2DM (type 2 diabetes mellitus) ([employer]) - Primary   Relevant Orders   CBC with Differential/Platelet   CMP14+EGFR   Lipid panel   Bayer DCA Hb A1c Waived     Other   Hyperlipidemia LDL goal <130   Relevant Orders   CBC with Differential/Platelet   CMP14+EGFR   Lipid panel   Bayer DCA Hb A1c Waived    Continue current medications, focus on diet and exercise with A1c being up slightly at 7.0. Follow up plan: No follow-ups on file.  Counseling provided for all of the vaccine components Orders Placed This Encounter  Procedures   CBC with Differential/Platelet   CMP14+EGFR   Lipid panel   Bayer DCA Hb A1c Waived    Caryl Pina, MD [facility] 10/24/2020, 2:51 PM

## 2020-10-25 LAB — CMP14+EGFR
ALT: 15 IU/L (ref 0–44)
AST: 15 IU/L (ref 0–40)
Albumin/Globulin Ratio: 1.6 (ref 1.2–2.2)
Albumin: 4.1 g/dL (ref 3.7–4.7)
Alkaline Phosphatase: 120 IU/L (ref 44–121)
BUN/Creatinine Ratio: 17 (ref 10–24)
BUN: 20 mg/dL (ref 8–27)
Bilirubin Total: 0.5 mg/dL (ref 0.0–1.2)
CO2: 21 mmol/L (ref 20–29)
Calcium: 9.2 mg/dL (ref 8.6–10.2)
Chloride: 107 mmol/L — ABNORMAL HIGH (ref 96–106)
Creatinine, Ser: 1.17 mg/dL (ref 0.76–1.27)
Globulin, Total: 2.5 g/dL (ref 1.5–4.5)
Glucose: 103 mg/dL — ABNORMAL HIGH (ref 65–99)
Potassium: 5.1 mmol/L (ref 3.5–5.2)
Sodium: 147 mmol/L — ABNORMAL HIGH (ref 134–144)
Total Protein: 6.6 g/dL (ref 6.0–8.5)
eGFR: 63 mL/min/{1.73_m2} (ref >59)

## 2020-10-25 LAB — CBC WITH DIFFERENTIAL/PLATELET
Basophils Absolute: 0 10*3/uL (ref 0.0–0.2)
Basos: 1 %
EOS (ABSOLUTE): 0.1 10*3/uL (ref 0.0–0.4)
Eos: 2 %
Hematocrit: 36.7 % — ABNORMAL LOW (ref 37.5–51.0)
Hemoglobin: 11.5 g/dL — ABNORMAL LOW (ref 13.0–17.7)
Immature Grans (Abs): 0 10*3/uL (ref 0.0–0.1)
Immature Granulocytes: 0 %
Lymphocytes Absolute: 1.1 10*3/uL (ref 0.7–3.1)
Lymphs: 24 %
MCH: 29 pg (ref 26.6–33.0)
MCHC: 31.3 g/dL — ABNORMAL LOW (ref 31.5–35.7)
MCV: 92 fL (ref 79–97)
Monocytes Absolute: 0.5 10*3/uL (ref 0.1–0.9)
Monocytes: 11 %
Neutrophils Absolute: 2.8 10*3/uL (ref 1.4–7.0)
Neutrophils: 62 %
Platelets: 229 10*3/uL (ref 150–450)
RBC: 3.97 x10E6/uL — ABNORMAL LOW (ref 4.14–5.80)
RDW: 13.2 % (ref 11.6–15.4)
WBC: 4.5 10*3/uL (ref 3.4–10.8)

## 2020-10-25 LAB — LIPID PANEL
Chol/HDL Ratio: 3.1 ratio (ref 0.0–5.0)
Cholesterol, Total: 157 mg/dL (ref 100–199)
HDL: 50 mg/dL (ref >39)
LDL Chol Calc (NIH): 98 mg/dL (ref 0–99)
Triglycerides: 38 mg/dL (ref 0–149)
VLDL Cholesterol Cal: 9 mg/dL (ref 5–40)

## 2020-10-27 DIAGNOSIS — Z20822 Contact with and (suspected) exposure to covid-19: Secondary | ICD-10-CM | POA: Diagnosis not present

## 2020-11-01 ENCOUNTER — Ambulatory Visit (INDEPENDENT_AMBULATORY_CARE_PROVIDER_SITE_OTHER): Payer: Medicare Other

## 2020-11-01 ENCOUNTER — Ambulatory Visit: Payer: Medicare Other

## 2020-11-01 VITALS — Ht 67.0 in | Wt 165.0 lb

## 2020-11-01 DIAGNOSIS — Z0000 Encounter for general adult medical examination without abnormal findings: Secondary | ICD-10-CM

## 2020-11-01 NOTE — Patient Instructions (Signed)
Shawn Lambert , Thank you for taking time to come for your Medicare Wellness Visit. I appreciate your ongoing commitment to your health goals. Please review the following plan we discussed and let me know if I can assist you in the future.   Screening recommendations/referrals: Colonoscopy: Cologuard done 03/22/2020 - Repeat not required Recommended yearly ophthalmology/optometry visit for glaucoma screening and checkup Recommended yearly dental visit for hygiene and checkup  Vaccinations: Influenza vaccine: Done 01/19/2020 - Repeat annually Pneumococcal vaccine: Done 02/28/2015 & 11/08/2016 Tdap vaccine: Done 02/18/2020 - Repeat in 10 years Shingles vaccine: First dose done 10/24/2020 - due for second dose in 2-6 months   Covid-19: Done 04/22/19, 05/20/19, & 03/17/20  Advanced directives: Advance directive discussed with you today. Even though you declined this today, please call our office should you change your mind, and we can give you the proper paperwork for you to fill out.   Conditions/risks identified: Aim for 30 minutes of exercise or brisk walking each day, drink 6-8 glasses of water and eat lots of fruits and vegetables.   Next appointment: Follow up in one year for your annual wellness visit.   Preventive Care 80 Years and Older, Male  Preventive care refers to lifestyle choices and visits with your health care provider that can promote health and wellness. What does preventive care include? A yearly physical exam. This is also called an annual well check. Dental exams once or twice a year. Routine eye exams. Ask your health care provider how often you should have your eyes checked. Personal lifestyle choices, including: Daily care of your teeth and gums. Regular physical activity. Eating a healthy diet. Avoiding tobacco and drug use. Limiting alcohol use. Practicing safe sex. Taking low doses of aspirin every day. Taking vitamin and mineral supplements as recommended by your  health care provider. What happens during an annual well check? The services and screenings done by your health care provider during your annual well check will depend on your age, overall health, lifestyle risk factors, and family history of disease. Counseling  Your health care provider may ask you questions about your: Alcohol use. Tobacco use. Drug use. Emotional well-being. Home and relationship well-being. Sexual activity. Eating habits. History of falls. Memory and ability to understand (cognition). Work and work Statistician. Screening  You may have the following tests or measurements: Height, weight, and BMI. Blood pressure. Lipid and cholesterol levels. These may be checked every 5 years, or more frequently if you are over 47 years old. Skin check. Lung cancer screening. You may have this screening every year starting at age 36 if you have a 30-pack-year history of smoking and currently smoke or have quit within the past 15 years. Fecal occult blood test (FOBT) of the stool. You may have this test every year starting at age 39. Flexible sigmoidoscopy or colonoscopy. You may have a sigmoidoscopy every 5 years or a colonoscopy every 10 years starting at age 25. Prostate cancer screening. Recommendations will vary depending on your family history and other risks. Hepatitis C blood test. Hepatitis B blood test. Sexually transmitted disease (STD) testing. Diabetes screening. This is done by checking your blood sugar (glucose) after you have not eaten for a while (fasting). You may have this done every 1-3 years. Abdominal aortic aneurysm (AAA) screening. You may need this if you are a current or former smoker. Osteoporosis. You may be screened starting at age 9 if you are at high risk. Talk with your health care provider about your  test results, treatment options, and if necessary, the need for more tests. Vaccines  Your health care provider may recommend certain vaccines, such  as: Influenza vaccine. This is recommended every year. Tetanus, diphtheria, and acellular pertussis (Tdap, Td) vaccine. You may need a Td booster every 10 years. Zoster vaccine. You may need this after age 76. Pneumococcal 13-valent conjugate (PCV13) vaccine. One dose is recommended after age 24. Pneumococcal polysaccharide (PPSV23) vaccine. One dose is recommended after age 20. Talk to your health care provider about which screenings and vaccines you need and how often you need them. This information is not intended to replace advice given to you by your health care provider. Make sure you discuss any questions you have with your health care provider. Document Released: 04/01/2015 Document Revised: 11/23/2015 Document Reviewed: 01/04/2015 Elsevier Interactive Patient Education  2017 [employer] Prevention in the Home Falls can cause injuries. They can happen to people of all ages. There are many things you can do to make your home safe and to help prevent falls. What can I do on the outside of my home? Regularly fix the edges of walkways and driveways and fix any cracks. Remove anything that might make you trip as you walk through a door, such as a raised step or threshold. Trim any bushes or trees on the path to your home. Use bright outdoor lighting. Clear any walking paths of anything that might make someone trip, such as rocks or tools. Regularly check to see if handrails are loose or broken. Make sure that both sides of any steps have handrails. Any raised decks and porches should have guardrails on the edges. Have any leaves, snow, or ice cleared regularly. Use sand or salt on walking paths during winter. Clean up any spills in your garage right away. This includes oil or grease spills. What can I do in the bathroom? Use night lights. Install grab bars by the toilet and in the tub and shower. Do not use towel bars as grab bars. Use non-skid mats or decals in the tub or  shower. If you need to sit down in the shower, use a plastic, non-slip stool. Keep the floor dry. Clean up any water that spills on the floor as soon as it happens. Remove soap buildup in the tub or shower regularly. Attach bath mats securely with double-sided non-slip rug tape. Do not have throw rugs and other things on the floor that can make you trip. What can I do in the bedroom? Use night lights. Make sure that you have a light by your bed that is easy to reach. Do not use any sheets or blankets that are too big for your bed. They should not hang down onto the floor. Have a firm chair that has side arms. You can use this for support while you get dressed. Do not have throw rugs and other things on the floor that can make you trip. What can I do in the kitchen? Clean up any spills right away. Avoid walking on wet floors. Keep items that you use a lot in easy-to-reach places. If you need to reach something above you, use a strong step stool that has a grab bar. Keep electrical cords out of the way. Do not use floor polish or wax that makes floors slippery. If you must use wax, use non-skid floor wax. Do not have throw rugs and other things on the floor that can make you trip. What can I do with my  stairs? Do not leave any items on the stairs. Make sure that there are handrails on both sides of the stairs and use them. Fix handrails that are broken or loose. Make sure that handrails are as long as the stairways. Check any carpeting to make sure that it is firmly attached to the stairs. Fix any carpet that is loose or worn. Avoid having throw rugs at the top or bottom of the stairs. If you do have throw rugs, attach them to the floor with carpet tape. Make sure that you have a light switch at the top of the stairs and the bottom of the stairs. If you do not have them, ask someone to add them for you. What else can I do to help prevent falls? Wear shoes that: Do not have high heels. Have  rubber bottoms. Are comfortable and fit you well. Are closed at the toe. Do not wear sandals. If you use a stepladder: Make sure that it is fully opened. Do not climb a closed stepladder. Make sure that both sides of the stepladder are locked into place. Ask someone to hold it for you, if possible. Clearly mark and make sure that you can see: Any grab bars or handrails. First and last steps. Where the edge of each step is. Use tools that help you move around (mobility aids) if they are needed. These include: Canes. Walkers. Scooters. Crutches. Turn on the lights when you go into a dark area. Replace any light bulbs as soon as they burn out. Set up your furniture so you have a clear path. Avoid moving your furniture around. If any of your floors are uneven, fix them. If there are any pets around you, be aware of where they are. Review your medicines with your doctor. Some medicines can make you feel dizzy. This can increase your chance of falling. Ask your doctor what other things that you can do to help prevent falls. This information is not intended to replace advice given to you by your health care provider. Make sure you discuss any questions you have with your health care provider. Document Released: 12/30/2008 Document Revised: 08/11/2015 Document Reviewed: 04/09/2014 Elsevier Interactive Patient Education  2017 [employer].

## 2020-11-01 NOTE — Progress Notes (Signed)
Subjective:   Shawn Lambert is a 80 y.o. male who presents for Medicare Annual/Subsequent preventive examination.  Virtual Visit via Telephone Note  I connected with  Shawn Lambert on 11/01/20 at  9:00 AM EDT by telephone and verified that I am speaking with the correct person using two identifiers.  Location: Patient: Home Provider: [facility] Persons participating in the virtual visit: patient/Nurse Health Advisor   I discussed the limitations, risks, security and privacy concerns of performing an evaluation and management service by telephone and the availability of in person appointments. The patient expressed understanding and agreed to proceed.  Interactive audio and video telecommunications were attempted between this nurse and patient, however failed, due to patient having technical difficulties OR patient did not have access to video capability.  We continued and completed visit with audio only.  Some vital signs may be absent or patient reported.   Linder Prajapati E Mackinze Criado, LPN   Review of Systems     Cardiac Risk Factors include: advanced age (>60mn, >>38women);diabetes mellitus;hypertension;male gender     Objective:    Today's Vitals   11/01/20 0901  Weight: 165 lb (74.8 kg)  Height: '5\' 7"'  (1.702 m)  PainSc: 5    Body mass index is 25.84 kg/m.  Advanced Directives 11/01/2020 05/04/2018 05/04/2018 10/24/2017 08/01/2015 07/26/2015 07/15/2015  Does Patient Have a Medical Advance Directive? No No No No No No No  Would patient like information on creating a medical advance directive? No - Patient declined No - Patient declined - No - Patient declined - No - patient declined information No - patient declined information    Current Medications (verified) Outpatient Encounter Medications as of 11/01/2020  Medication Sig   amLODipine (NORVASC) 5 MG tablet Take 1.5 tablets (7.5 mg total) by mouth daily.   aspirin EC 81 MG tablet Take 81 mg by mouth daily.   atorvastatin (LIPITOR) 40 MG  tablet Take 1 tablet (40 mg total) by mouth daily.   Beta Carotene (VITAMIN A) 25000 UNIT capsule Take 25,000 Units by mouth daily.   Calcium Carbonate-Vitamin D (CALCIUM-VITAMIN D) 600-200 MG-UNIT CAPS Take 2 tablets by mouth daily. Reported on 08/01/2015   Glucosamine-Chondroitin-MSM 750-400-375 MG TABS Take 1 tablet by mouth daily.    lisinopril (ZESTRIL) 10 MG tablet Take 1 tablet (10 mg total) by mouth daily.   Multiple Vitamins-Minerals (CVS SPECTRAVITE ADULT 50+ PO) Take 1 tablet by mouth daily. Reported on 08/01/2015   omeprazole (PRILOSEC) 40 MG capsule Take 1 capsule (40 mg total) by mouth daily.   Facility-Administered Encounter Medications as of 11/01/2020  Medication   Betamethasone Sod Phos & Acet 30 MG/5ML KIT 2 mL    Allergies (verified) Penicillins and Sulfonamide derivatives   History: Past Medical History:  Diagnosis Date   Anginal pain ([place]    Arthritis    [place]    basil cell skin cancer   Dysrhythmia    pt. states physician said " he has a irregular heart beat"   GERD (gastroesophageal reflux disease)    Hyperlipidemia    Hypertension    Kidney stones    Past Surgical History:  Procedure Laterality Date   EYE SURGERY Right 1965   Prosthetic Eye   LEG SURGERY Left 12/18/2016   fractured femur   TOTAL KNEE ARTHROPLASTY     TOTAL KNEE ARTHROPLASTY Right 07/26/2015   Procedure: RIGHT TOTAL KNEE ARTHROPLASTY;  Surgeon: MParalee Cancel MD;  Location: [facility];  Service: Orthopedics;  Laterality: Right;  Family History  Problem Relation Age of Onset   Congestive Heart Failure Father    Congestive Heart Failure Mother    AAA (abdominal aortic aneurysm) Mother    COPD Mother    Macular degeneration Mother    Heart attack Brother 35   Arthritis Brother    Multiple sclerosis Son    Social History   Socioeconomic History   Marital status: Widowed    Spouse name: Not on file   Number of children: 2   Years of education: 14   Highest education level:  Associate degree: occupational, Hotel manager, or vocational program  Occupational History   Occupation: Retired    Comment: Conservation officer, nature  Tobacco Use   Smoking status: Never   Smokeless tobacco: Never  Vaping Use   Vaping Use: Never used  Substance and Sexual Activity   Alcohol use: No   Drug use: No   Sexual activity: Yes  Other Topics Concern   Not on file  Social History Narrative   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   Social Determinants of Health   Financial Resource Strain: Low Risk    Difficulty of Paying Living Expenses: Not hard at all  Food Insecurity: No Food Insecurity   Worried About Charity fundraiser in the Last Year: Never true   Arboriculturist in the Last Year: Never true  Transportation Needs: No Transportation Needs   Lack of Transportation (Medical): No   Lack of Transportation (Non-Medical): No  Physical Activity: Sufficiently Active   Days of Exercise per Week: 6 days   Minutes of Exercise per Session: 60 min  Stress: No Stress Concern Present   Feeling of Stress : Not at all  Social Connections: Moderately Isolated   Frequency of Communication with Friends and Family: More than three times a week   Frequency of Social Gatherings with Friends and Family: More than three times a week   Attends Religious Services: Never   Marine scientist or Organizations: No   Attends Music therapist: Never   Marital Status: Married    Tobacco Counseling Counseling given: Not Answered   Clinical Intake:  Pre-visit preparation completed: Yes  Pain : 0-10 Pain Score: 5  Pain Type: Chronic pain Pain Location: Leg Pain Orientation: Left Pain Descriptors / Indicators: Aching, Discomfort, Dull Pain Onset: More than a month ago Pain Frequency: Intermittent     BMI - recorded: 25.84 Nutritional Status: BMI 25 -29  Overweight Nutritional Risks: None Diabetes: Yes CBG done?: No Did pt. bring in CBG monitor from home?: No  How often do you need to have someone help you when you read instructions, pamphlets, or other written materials from your doctor or pharmacy?: 1 - Never  Diabetic?Nutrition Risk Assessment:  Has the patient had any N/V/D within the last 2 months?  No  Does the patient have any non-healing wounds?  No  Has the patient had any unintentional weight loss or weight gain?  No   Diabetes:  Is the patient diabetic?  Yes  If diabetic, was a CBG obtained today?  No  Did the patient bring in their glucometer from home?  No  How often do you monitor your CBG's? Once daily fasting - 120 this am per patient.   Financial Strains and Diabetes Management:  Are you having any financial strains with  the device, your supplies or your medication? No .  Does the patient want to be seen by Chronic Care Management for management of their diabetes?  No  Would the patient like to be referred to a Nutritionist or for Diabetic Management?  No   Diabetic Exams:  Diabetic Eye Exam: Completed 05/20/2020  Diabetic Foot Exam: Completed 05/19/2020. Pt has been advised about the importance in completing this exam. Pt is scheduled for diabetic foot exam on next year.    Interpreter Needed?: No  Information entered by :: Trusten Hume, LPN   Activities of Daily Living In your present state of health, do you have any difficulty performing the following activities: 11/01/2020  Hearing? N  Vision? N  Difficulty concentrating or making decisions? Y  Walking or climbing stairs? Y  Dressing or bathing? N  Doing errands, shopping? N  Preparing Food and eating ? N  Using the Toilet? N  In the past six months, have you accidently leaked urine? N  Do you have problems with loss of bowel control? N  Managing your Medications? N  Managing your Finances? N  Housekeeping or managing your Housekeeping? N  Some  recent data might be hidden    Patient Care Team: Dettinger, Fransisca Kaufmann, MD as PCP - General (Family Medicine) Paralee Cancel, MD as Consulting Physician (Orthopedic Surgery) Lavera Guise, [facility] (Pharmacist)  Indicate any recent Medical Services you may have received from other than [facility] providers in the past year (date may be approximate).     Assessment:   This is a routine wellness examination for Alonte.  Hearing/Vision screen Hearing Screening - Comments:: Denies hearing difficulties  Vision Screening - Comments:: Up to date with annual eye exams with Dr Alanda Slim - denies vision difficulties   Dietary issues and exercise activities discussed: Current Exercise Habits: The patient has a physically strenuous job, but has no regular exercise apart from work., Type of exercise: walking;Other - see comments (farming), Time (Minutes): 60, Frequency (Times/Week): 6, Weekly Exercise (Minutes/Week): 360, Intensity: Moderate, Exercise limited by: orthopedic condition(s)   Goals Addressed             This Visit's Progress    Exercise 150 min/wk Moderate Activity   On track      Depression Screen PHQ 2/9 Scores 11/01/2020 10/24/2020 05/19/2020 02/18/2020 08/19/2019 05/16/2018 04/07/2018  PHQ - 2 Score 0 0 0 0 0 0 0    Fall Risk Fall Risk  11/01/2020 10/24/2020 05/19/2020 02/18/2020 08/19/2019  Falls in the past year? 0 0 0 0 0  Number falls in past yr: 0 - - - -  Injury with Fall? 0 - - - -  Comment - - - - -  Risk Factor Category  - - - - -  Risk for fall due to : Orthopedic patient - - - -  Follow up Falls prevention discussed - - - -    FALL [place]:  Any stairs in or around the home? Yes  If so, are there any without handrails? No  Home free of loose throw rugs in walkways, pet beds, electrical cords, etc? Yes  Adequate lighting in your home to reduce risk of falls? Yes   ASSISTIVE DEVICES UTILIZED TO PREVENT FALLS:  Life alert? No  Use of a cane, walker  or w/c? No  Grab bars in the bathroom? Yes  Shower chair or bench in shower? No  Elevated toilet seat or a handicapped toilet? Yes  TIMED UP AND GO:  Was the test performed? No . Telephonic visit  Cognitive Function: MMSE - Mini Mental State Exam 10/24/2017  Orientation to time 5  Orientation to Place 5  Registration 3  Attention/ Calculation 5  Recall 1  Language- name 2 objects 2  Language- repeat 1  Language- follow 3 step command 3  Language- read & follow direction 1  Write a sentence 1  Copy design 1  Total score 28     6CIT Screen 11/01/2020  What Year? 0 points  What month? 0 points  What time? 0 points  Count back from 20 0 points  Months in reverse 0 points  Repeat phrase 2 points  Total Score 2    Immunizations Immunization History  Administered Date(s) Administered   Influenza, High Dose Seasonal PF 12/08/2013, 12/30/2015, 12/27/2016, 01/10/2018   Influenza,inj,Quad PF,6+ Mos 12/08/2012, 02/28/2015   Influenza-Unspecified 03/06/2019, 01/19/2020   Moderna Sars-Covid-2 Vaccination 04/22/2019, 05/20/2019, 03/17/2020   Pneumococcal Conjugate-13 02/28/2015   Pneumococcal Polysaccharide-23 11/08/2016   Tdap 02/18/2020   Zoster Recombinat (Shingrix) 10/24/2020   Zoster, Live 06/25/2013    TDAP status: Up to date  Flu Vaccine status: Up to date  Pneumococcal vaccine status: Up to date  Covid-19 vaccine status: Completed vaccines  Qualifies for Shingles Vaccine? Yes   Zostavax completed Yes   Shingrix Completed?: No.    Education has been provided regarding the importance of this vaccine. Patient has been advised to call insurance company to determine out of pocket expense if they have not yet received this vaccine. Advised may also receive vaccine at local pharmacy or Health Dept. Verbalized acceptance and understanding.  Screening Tests Health Maintenance  Topic Date Due   COVID-19 Vaccine (4 - Booster for Moderna series) 07/16/2020   INFLUENZA  VACCINE  10/17/2020   Zoster Vaccines- Shingrix (2 of 2) 12/19/2020   HEMOGLOBIN A1C  04/26/2021   FOOT EXAM  05/19/2021   OPHTHALMOLOGY EXAM  05/20/2021   TETANUS/TDAP  02/17/2030   PNA vac Low Risk Adult  Completed   HPV VACCINES  Aged Out   Hepatitis C Screening  Discontinued    Health Maintenance  Health Maintenance Due  Topic Date Due   COVID-19 Vaccine (4 - Booster for Moderna series) 07/16/2020   INFLUENZA VACCINE  10/17/2020    Colorectal cancer screening: No longer required.   Lung Cancer Screening: (Low Dose CT Chest recommended if Age 34-80 years, 30 pack-year currently smoking OR have quit w/in 15years.) does not qualify.   Additional Screening:  Hepatitis C Screening: does not qualify  Vision Screening: Recommended annual ophthalmology exams for early detection of glaucoma and other disorders of the eye. Is the patient up to date with their annual eye exam?  Yes  Who is the provider or what is the name of the office in which the patient attends annual eye exams? [facility] If pt is not established with a provider, would they like to be referred to a provider to establish care? No .   Dental Screening: Recommended annual dental exams for proper oral hygiene  Community Resource Referral / Chronic Care Management: CRR required this visit?  No   CCM required this visit?  No      Plan:     I have personally reviewed and noted the following in the patient's chart:   Medical and social history Use of alcohol, tobacco or illicit drugs  Current medications and supplements including opioid prescriptions. Patient is not currently taking opioid  prescriptions. Functional ability and status Nutritional status Physical activity Advanced directives List of other physicians Hospitalizations, surgeries, and ER visits in previous 12 months Vitals Screenings to include cognitive, depression, and falls Referrals and appointments  In addition, I have reviewed and  discussed with patient certain preventive protocols, quality metrics, and best practice recommendations. A written personalized care plan for preventive services as well as general preventive health recommendations were provided to patient.     Sandrea Hammond, LPN   09/12/9483   Nurse Notes: None

## 2021-01-25 DIAGNOSIS — Z20828 Contact with and (suspected) exposure to other viral communicable diseases: Secondary | ICD-10-CM | POA: Diagnosis not present

## 2021-01-26 ENCOUNTER — Encounter: Payer: Self-pay | Admitting: Family Medicine

## 2021-01-26 ENCOUNTER — Ambulatory Visit (INDEPENDENT_AMBULATORY_CARE_PROVIDER_SITE_OTHER): Payer: Medicare Other | Admitting: Family Medicine

## 2021-01-26 ENCOUNTER — Other Ambulatory Visit: Payer: Self-pay

## 2021-01-26 VITALS — BP 144/74 | HR 56 | Ht 67.0 in | Wt 170.0 lb

## 2021-01-26 DIAGNOSIS — I10 Essential (primary) hypertension: Secondary | ICD-10-CM

## 2021-01-26 DIAGNOSIS — Z23 Encounter for immunization: Secondary | ICD-10-CM

## 2021-01-26 DIAGNOSIS — E785 Hyperlipidemia, unspecified: Secondary | ICD-10-CM

## 2021-01-26 DIAGNOSIS — E119 Type 2 diabetes mellitus without complications: Secondary | ICD-10-CM | POA: Diagnosis not present

## 2021-01-26 LAB — CBC WITH DIFFERENTIAL/PLATELET
Basophils Absolute: 0 10*3/uL (ref 0.0–0.2)
Basos: 1 %
EOS (ABSOLUTE): 0.1 10*3/uL (ref 0.0–0.4)
Eos: 2 %
Hematocrit: 34.4 % — ABNORMAL LOW (ref 37.5–51.0)
Hemoglobin: 11.3 g/dL — ABNORMAL LOW (ref 13.0–17.7)
Immature Grans (Abs): 0 10*3/uL (ref 0.0–0.1)
Immature Granulocytes: 0 %
Lymphocytes Absolute: 0.7 10*3/uL (ref 0.7–3.1)
Lymphs: 17 %
MCH: 28.9 pg (ref 26.6–33.0)
MCHC: 32.8 g/dL (ref 31.5–35.7)
MCV: 88 fL (ref 79–97)
Monocytes Absolute: 0.5 10*3/uL (ref 0.1–0.9)
Monocytes: 11 %
Neutrophils Absolute: 2.7 10*3/uL (ref 1.4–7.0)
Neutrophils: 69 %
Platelets: 219 10*3/uL (ref 150–450)
RBC: 3.91 x10E6/uL — ABNORMAL LOW (ref 4.14–5.80)
RDW: 13.4 % (ref 11.6–15.4)
WBC: 4 10*3/uL (ref 3.4–10.8)

## 2021-01-26 LAB — BAYER DCA HB A1C WAIVED: HB A1C (BAYER DCA - WAIVED): 7 % — ABNORMAL HIGH (ref 4.8–5.6)

## 2021-01-26 MED ORDER — ATORVASTATIN CALCIUM 40 MG PO TABS: 40.0000 mg | ORAL_TABLET | Freq: Every day | ORAL | 3 refills | Status: DC

## 2021-01-26 MED ORDER — LISINOPRIL 10 MG PO TABS: 10.0000 mg | ORAL_TABLET | Freq: Every day | ORAL | 3 refills | Status: DC

## 2021-01-26 NOTE — Progress Notes (Signed)
BP (!) 144/74   Pulse (!) 56   Ht 5\' 7"  (1.702 m)   Wt 170 lb (77.1 kg)   SpO2 100%   BMI 26.63 kg/m    Subjective:   Patient ID: Shawn Lambert, male    DOB: July 12, 1940, 80 y.o.   MRN: [id number]  HPI: Shawn Lambert is a 80 y.o. male presenting on 01/26/2021 for Medical Management of Chronic Issues and Hypertension   HPI Type 2 diabetes mellitus Patient comes in today for recheck of his diabetes. Patient has been currently taking no medicine, diet control, 7.0 today. Patient is currently on an ACE inhibitor/ARB. Patient has seen an ophthalmologist this year. Patient denies any issues with their feet. The symptom started onset as an adult hypertension and hyperlipidemia ARE RELATED TO DM   Hypertension Patient is currently on lisinopril and amlodipine, and their blood pressure today is 144/74. Patient denies any lightheadedness or dizziness. Patient denies headaches, blurred vision, chest pains, shortness of breath, or weakness. Denies any side effects from medication and is content with current medication.   Hyperlipidemia Patient is coming in for recheck of his hyperlipidemia. The patient is currently taking atorvastatin. They deny any issues with myalgias or history of liver damage from it. They deny any focal numbness or weakness or chest pain.   Relevant past medical, surgical, family and social history reviewed and updated as indicated. Interim medical history since our last visit reviewed. Allergies and medications reviewed and updated.  Review of Systems  Constitutional:  Negative for chills and fever.  Eyes:  Negative for visual disturbance.  Respiratory:  Negative for shortness of breath and wheezing.   Cardiovascular:  Negative for chest pain and leg swelling.  Musculoskeletal:  Negative for back pain and gait problem.  Skin:  Negative for rash.  Neurological:  Negative for dizziness, weakness and light-headedness.  All other systems reviewed and are  negative.  Per HPI unless specifically indicated above   Allergies as of 01/26/2021       Reactions   Penicillins Itching   Has patient had a PCN reaction causing immediate rash, facial/tongue/throat swelling, SOB or lightheadedness with hypotension: no Has patient had a PCN reaction causing severe rash involving mucus membranes or skin necrosis: no Has patient had a PCN reaction that required hospitalization no Has patient had a PCN reaction occurring within the last 10 years: no If all of the above answers are "NO", then may proceed with Cephalosporin use.   Sulfonamide Derivatives Other (See Comments)   Reaction not known        Medication List        Accurate as of January 26, 2021 11:11 AM. If you have any questions, ask your nurse or doctor.          amLODipine 5 MG tablet Commonly known as: NORVASC Take 1.5 tablets (7.5 mg total) by mouth daily.   aspirin EC 81 MG tablet Take 81 mg by mouth daily.   atorvastatin 40 MG tablet Commonly known as: LIPITOR Take 1 tablet (40 mg total) by mouth daily.   Calcium-Vitamin D 600-200 MG-UNIT Caps Take 2 tablets by mouth daily. Reported on 08/01/2015   CVS SPECTRAVITE ADULT 50+ PO Take 1 tablet by mouth daily. Reported on 08/01/2015   Glucosamine-Chondroitin-MSM 750-400-375 MG Tabs Take 1 tablet by mouth daily.   lisinopril 10 MG tablet Commonly known as: ZESTRIL Take 1 tablet (10 mg total) by mouth daily.   omeprazole 40 MG capsule Commonly known  as: PRILOSEC Take 1 capsule (40 mg total) by mouth daily.   vitamin A 25000 UNIT capsule Take 25,000 Units by mouth daily.         Objective:   BP (!) 144/74   Pulse (!) 56   Ht 5\' 7"  (1.702 m)   Wt 170 lb (77.1 kg)   SpO2 100%   BMI 26.63 kg/m   Wt Readings from Last 3 Encounters:  01/26/21 170 lb (77.1 kg)  11/01/20 165 lb (74.8 kg)  10/24/20 168 lb (76.2 kg)    Physical Exam Vitals and nursing note reviewed.  Constitutional:      General: He is  not in acute distress.    Appearance: He is well-developed. He is not diaphoretic.  Eyes:     General: No scleral icterus.    Conjunctiva/sclera: Conjunctivae normal.  Neck:     Thyroid: No thyromegaly.  Cardiovascular:     Rate and Rhythm: Normal rate and regular rhythm.     Heart sounds: Normal heart sounds. No murmur heard. Pulmonary:     Effort: Pulmonary effort is normal. No respiratory distress.     Breath sounds: Normal breath sounds. No wheezing.  Musculoskeletal:        General: No swelling. Normal range of motion.     Cervical back: Neck supple.  Lymphadenopathy:     Cervical: No cervical adenopathy.  Skin:    General: Skin is warm and dry.     Findings: No rash.  Neurological:     Mental Status: He is alert and oriented to person, place, and time.     Coordination: Coordination normal.  Psychiatric:        Behavior: Behavior normal.      Assessment & Plan:   Problem List Items Addressed This Visit       Cardiovascular and Mediastinum   Essential hypertension   Relevant Medications   atorvastatin (LIPITOR) 40 MG tablet   lisinopril (ZESTRIL) 10 MG tablet   Other Relevant Orders   CBC with Differential/Platelet   Bayer DCA Hb A1c Waived     Endocrine   T2DM (type 2 diabetes mellitus) ([place]) - Primary   Relevant Medications   atorvastatin (LIPITOR) 40 MG tablet   lisinopril (ZESTRIL) 10 MG tablet   Other Relevant Orders   CBC with Differential/Platelet   Bayer DCA Hb A1c Waived     Other   Hyperlipidemia LDL goal <130   Relevant Medications   atorvastatin (LIPITOR) 40 MG tablet   lisinopril (ZESTRIL) 10 MG tablet   Other Relevant Orders   CBC with Differential/Platelet   Bayer DCA Hb A1c Waived    Patient seems to be doing well, A1c is up slightly at 7.0 but will not change it, blood pressure up slightly but seems to run better in the 130s at home, he will continue to monitor.  Allowing permissive hypertension.  No change in medication  today. Follow up plan: Return in about 3 months (around 04/28/2021), or if symptoms worsen or fail to improve, for Diabetes and hypertension and cholesterol.  Counseling provided for all of the vaccine components Orders Placed This Encounter  Procedures   CBC with Differential/Platelet   Bayer DCA Hb A1c [place], MD [facility] 01/26/2021, 11:11 AM

## 2021-04-20 ENCOUNTER — Encounter: Payer: Self-pay | Admitting: Family Medicine

## 2021-04-20 ENCOUNTER — Ambulatory Visit (INDEPENDENT_AMBULATORY_CARE_PROVIDER_SITE_OTHER): Payer: Medicare Other | Admitting: Family Medicine

## 2021-04-20 VITALS — BP 130/70 | HR 59 | Ht 67.0 in | Wt 172.0 lb

## 2021-04-20 DIAGNOSIS — E119 Type 2 diabetes mellitus without complications: Secondary | ICD-10-CM

## 2021-04-20 DIAGNOSIS — Z23 Encounter for immunization: Secondary | ICD-10-CM

## 2021-04-20 DIAGNOSIS — E785 Hyperlipidemia, unspecified: Secondary | ICD-10-CM | POA: Diagnosis not present

## 2021-04-20 DIAGNOSIS — I10 Essential (primary) hypertension: Secondary | ICD-10-CM

## 2021-04-20 DIAGNOSIS — K219 Gastro-esophageal reflux disease without esophagitis: Secondary | ICD-10-CM | POA: Diagnosis not present

## 2021-04-20 LAB — BAYER DCA HB A1C WAIVED: HB A1C (BAYER DCA - WAIVED): 7.3 % — ABNORMAL HIGH (ref 4.8–5.6)

## 2021-04-20 LAB — LIPID PANEL

## 2021-04-20 MED ORDER — LISINOPRIL 10 MG PO TABS: 10.0000 mg | ORAL_TABLET | Freq: Every day | ORAL | 3 refills | Status: DC

## 2021-04-20 MED ORDER — OMEPRAZOLE 40 MG PO CPDR: 40.0000 mg | DELAYED_RELEASE_CAPSULE | Freq: Every day | ORAL | 3 refills | Status: DC

## 2021-04-20 MED ORDER — AMLODIPINE BESYLATE 5 MG PO TABS: 7.5000 mg | ORAL_TABLET | Freq: Every day | ORAL | 3 refills | Status: DC

## 2021-04-20 MED ORDER — ATORVASTATIN CALCIUM 40 MG PO TABS: 40.0000 mg | ORAL_TABLET | Freq: Every day | ORAL | 3 refills | Status: DC

## 2021-04-20 NOTE — Progress Notes (Signed)
BP 130/70    Pulse (!) 59    Ht _0  (1.702 m)    Wt 172 lb (78 kg)    SpO2 98%    BMI 26.94 kg/m    Subjective:   Patient ID: Shawn Lambert, male    DOB: May 17, 1940, 81 y.o.   MRN: [id number]  HPI: Shawn Lambert is a 81 y.o. male presenting on 04/20/2021 for Medical Management of Chronic Issues, Diabetes, and Hypertension   HPI Type 2 diabetes mellitus Patient comes in today for recheck of his diabetes. Patient has been currently taking no medication has been diet controlled for diabetes.. Patient is currently on an ACE inhibitor/ARB. Patient has not seen an ophthalmologist this year. Patient denies any issues with their feet. The symptom started onset as an adult hypertension and hyperlipidemia ARE RELATED TO DM   Hypertension Patient is currently on amlodipine and lisinopril, and their blood pressure today is 130/70. Patient denies any lightheadedness or dizziness. Patient denies headaches, blurred vision, chest pains, shortness of breath, or weakness. Denies any side effects from medication and is content with current medication.   Hyperlipidemia Patient is coming in for recheck of his hyperlipidemia. The patient is currently taking atorvastatin. They deny any issues with myalgias or history of liver damage from it. They deny any focal numbness or weakness or chest pain.   Patient did want to mention that he has had nocturia and sometimes frequency during the daytime.  We did discuss that this is likely due to prostate but he could try medicine but he does not want to right now and says is not bad enough.  Patient gets arthritis in the shoulder and just wanted to mention it and says is not bad enough to do anything right now but it does crackle when he moves around and hurts in certain ranges of motion.  Relevant past medical, surgical, family and social history reviewed and updated as indicated. Interim medical history since our last visit reviewed. Allergies and medications  reviewed and updated.  Review of Systems  Constitutional:  Negative for chills and fever.  Eyes:  Negative for visual disturbance.  Respiratory:  Negative for shortness of breath and wheezing.   Cardiovascular:  Negative for chest pain and leg swelling.  Musculoskeletal:  Positive for arthralgias. Negative for back pain and gait problem.  Skin:  Negative for color change and rash.  All other systems reviewed and are negative.  Per HPI unless specifically indicated above   Allergies as of 04/20/2021       Reactions   Penicillins Itching   Has patient had a PCN reaction causing immediate rash, facial/tongue/throat swelling, SOB or lightheadedness with hypotension: no Has patient had a PCN reaction causing severe rash involving mucus membranes or skin necrosis: no Has patient had a PCN reaction that required hospitalization no Has patient had a PCN reaction occurring within the last 10 years: no If all of the above answers are "NO", then may proceed with Cephalosporin use.   Sulfonamide Derivatives Other (See Comments)   Reaction not known        Medication List        Accurate as of April 20, 2021 11:26 AM. If you have any questions, ask your nurse or doctor.          amLODipine 5 MG tablet Commonly known as: NORVASC Take 1.5 tablets (7.5 mg total) by mouth daily.   aspirin EC 81 MG tablet Take 81 mg by mouth  daily.   atorvastatin 40 MG tablet Commonly known as: LIPITOR Take 1 tablet (40 mg total) by mouth daily.   Calcium-Vitamin D 600-200 MG-UNIT Caps Take 2 tablets by mouth daily. Reported on 08/01/2015   [facility] SPECTRAVITE ADULT 50+ PO Take 1 tablet by mouth daily. Reported on 08/01/2015   Glucosamine-Chondroitin-MSM 750-400-375 MG Tabs Take 1 tablet by mouth daily.   lisinopril 10 MG tablet Commonly known as: ZESTRIL Take 1 tablet (10 mg total) by mouth daily.   omeprazole 40 MG capsule Commonly known as: PRILOSEC Take 1 capsule (40 mg total) by mouth  daily.   vitamin A 25000 UNIT capsule Take 25,000 Units by mouth daily.         Objective:   BP 130/70    Pulse (!) 59    Ht _0  (1.702 m)    Wt 172 lb (78 kg)    SpO2 98%    BMI 26.94 kg/m   Wt Readings from Last 3 Encounters:  04/20/21 172 lb (78 kg)  01/26/21 170 lb (77.1 kg)  11/01/20 165 lb (74.8 kg)    Physical Exam Vitals and nursing note reviewed.  Constitutional:      General: He is not in acute distress.    Appearance: He is well-developed. He is not diaphoretic.  Eyes:     General: No scleral icterus.    Conjunctiva/sclera: Conjunctivae normal.  Neck:     Thyroid: No thyromegaly.  Cardiovascular:     Rate and Rhythm: Normal rate and regular rhythm.     Heart sounds: Normal heart sounds. No murmur heard. Pulmonary:     Effort: Pulmonary effort is normal. No respiratory distress.     Breath sounds: Normal breath sounds. No wheezing.  Musculoskeletal:        General: No swelling or tenderness (No tenderness in and shoulder on exam, does have some crackling and popping with arthritis).     Cervical back: Neck supple.  Lymphadenopathy:     Cervical: No cervical adenopathy.  Skin:    General: Skin is warm and dry.     Findings: No rash.  Neurological:     Mental Status: He is alert and oriented to person, place, and time.     Coordination: Coordination normal.  Psychiatric:        Behavior: Behavior normal.      Assessment & Plan:   Problem List Items Addressed This Visit       Cardiovascular and Mediastinum   Essential hypertension   Relevant Medications   atorvastatin (LIPITOR) 40 MG tablet   lisinopril (ZESTRIL) 10 MG tablet   amLODipine (NORVASC) 5 MG tablet   Other Relevant Orders   CBC with Differential/Platelet   CMP14+EGFR   Lipid panel   Bayer DCA Hb A1c Waived     Digestive   GERD   Relevant Medications   omeprazole (PRILOSEC) 40 MG capsule     Endocrine   T2DM (type 2 diabetes mellitus) ([place]) - Primary   Relevant  Medications   atorvastatin (LIPITOR) 40 MG tablet   lisinopril (ZESTRIL) 10 MG tablet   Other Relevant Orders   CBC with Differential/Platelet   CMP14+EGFR   Lipid panel   Bayer DCA Hb A1c Waived     Other   Hyperlipidemia LDL goal <130   Relevant Medications   atorvastatin (LIPITOR) 40 MG tablet   lisinopril (ZESTRIL) 10 MG tablet   amLODipine (NORVASC) 5 MG tablet   Other Relevant Orders   CBC with  Differential/Platelet   CMP14+EGFR   Lipid panel   Bayer DCA Hb A1c Waived   Other Visit Diagnoses     Need for shingles vaccine       Relevant Orders   Varicella-zoster vaccine IM (Shingrix)       Continue current medicine, blood pressure looks good, no changes.  A1c is 7.3 so his blood sugars are up a little bit, mainly focus on diet at this point. Follow up plan: Return in about 3 months (around 07/18/2021), or if symptoms worsen or fail to improve, for dm and htn and hld.  Counseling provided for all of the vaccine components Orders Placed This Encounter  Procedures   Varicella-zoster vaccine IM (Shingrix)   CBC with Differential/Platelet   CMP14+EGFR   Lipid panel   Bayer DCA Hb A1c Waived    Caryl Pina, MD [place] 04/20/2021, 11:26 AM

## 2021-04-21 LAB — CMP14+EGFR
ALT: 13 IU/L (ref 0–44)
AST: 14 IU/L (ref 0–40)
Albumin/Globulin Ratio: 1.6 (ref 1.2–2.2)
Albumin: 4 g/dL (ref 3.7–4.7)
Alkaline Phosphatase: 126 IU/L — ABNORMAL HIGH (ref 44–121)
BUN/Creatinine Ratio: 19 (ref 10–24)
BUN: 21 mg/dL (ref 8–27)
Bilirubin Total: 0.5 mg/dL (ref 0.0–1.2)
CO2: 25 mmol/L (ref 20–29)
Calcium: 9 mg/dL (ref 8.6–10.2)
Chloride: 104 mmol/L (ref 96–106)
Creatinine, Ser: 1.11 mg/dL (ref 0.76–1.27)
Globulin, Total: 2.5 g/dL (ref 1.5–4.5)
Glucose: 141 mg/dL — ABNORMAL HIGH (ref 70–99)
Potassium: 5 mmol/L (ref 3.5–5.2)
Sodium: 140 mmol/L (ref 134–144)
Total Protein: 6.5 g/dL (ref 6.0–8.5)
eGFR: 67 mL/min/{1.73_m2} (ref >59)

## 2021-04-21 LAB — LIPID PANEL
Chol/HDL Ratio: 2.6 ratio (ref 0.0–5.0)
Cholesterol, Total: 141 mg/dL (ref 100–199)
HDL: 54 mg/dL (ref >39)
LDL Chol Calc (NIH): 77 mg/dL (ref 0–99)
Triglycerides: 44 mg/dL (ref 0–149)
VLDL Cholesterol Cal: 10 mg/dL (ref 5–40)

## 2021-04-21 LAB — CBC WITH DIFFERENTIAL/PLATELET
Basophils Absolute: 0 10*3/uL (ref 0.0–0.2)
Basos: 0 %
EOS (ABSOLUTE): 0.1 10*3/uL (ref 0.0–0.4)
Eos: 2 %
Hematocrit: 37 % — ABNORMAL LOW (ref 37.5–51.0)
Hemoglobin: 11.7 g/dL — ABNORMAL LOW (ref 13.0–17.7)
Immature Grans (Abs): 0 10*3/uL (ref 0.0–0.1)
Immature Granulocytes: 0 %
Lymphocytes Absolute: 0.8 10*3/uL (ref 0.7–3.1)
Lymphs: 17 %
MCH: 27.8 pg (ref 26.6–33.0)
MCHC: 31.6 g/dL (ref 31.5–35.7)
MCV: 88 fL (ref 79–97)
Monocytes Absolute: 0.5 10*3/uL (ref 0.1–0.9)
Monocytes: 10 %
Neutrophils Absolute: 3.5 10*3/uL (ref 1.4–7.0)
Neutrophils: 71 %
Platelets: 225 10*3/uL (ref 150–450)
RBC: 4.21 x10E6/uL (ref 4.14–5.80)
RDW: 13.7 % (ref 11.6–15.4)
WBC: 4.8 10*3/uL (ref 3.4–10.8)

## 2021-04-24 DIAGNOSIS — Z20822 Contact with and (suspected) exposure to covid-19: Secondary | ICD-10-CM | POA: Diagnosis not present

## 2021-05-13 DIAGNOSIS — M25562 Pain in left knee: Secondary | ICD-10-CM | POA: Insufficient documentation

## 2021-05-13 DIAGNOSIS — M179 Osteoarthritis of knee, unspecified: Secondary | ICD-10-CM | POA: Insufficient documentation

## 2021-06-23 DIAGNOSIS — Z1152 Encounter for screening for COVID-19: Secondary | ICD-10-CM | POA: Diagnosis not present

## 2021-06-23 DIAGNOSIS — Z20828 Contact with and (suspected) exposure to other viral communicable diseases: Secondary | ICD-10-CM | POA: Diagnosis not present

## 2021-07-03 DIAGNOSIS — Z20822 Contact with and (suspected) exposure to covid-19: Secondary | ICD-10-CM | POA: Diagnosis not present

## 2021-07-11 DIAGNOSIS — Z20822 Contact with and (suspected) exposure to covid-19: Secondary | ICD-10-CM | POA: Diagnosis not present

## 2021-07-11 DIAGNOSIS — Z85828 Personal history of other malignant neoplasm of skin: Secondary | ICD-10-CM | POA: Diagnosis not present

## 2021-07-11 DIAGNOSIS — Z1283 Encounter for screening for malignant neoplasm of skin: Secondary | ICD-10-CM | POA: Diagnosis not present

## 2021-07-11 DIAGNOSIS — L570 Actinic keratosis: Secondary | ICD-10-CM | POA: Diagnosis not present

## 2021-07-20 ENCOUNTER — Ambulatory Visit (INDEPENDENT_AMBULATORY_CARE_PROVIDER_SITE_OTHER): Payer: Medicare Other | Admitting: Family Medicine

## 2021-07-20 ENCOUNTER — Encounter: Payer: Self-pay | Admitting: Family Medicine

## 2021-07-20 VITALS — BP 142/70 | HR 54 | Ht 67.0 in | Wt 176.0 lb

## 2021-07-20 DIAGNOSIS — E785 Hyperlipidemia, unspecified: Secondary | ICD-10-CM

## 2021-07-20 DIAGNOSIS — E119 Type 2 diabetes mellitus without complications: Secondary | ICD-10-CM | POA: Diagnosis not present

## 2021-07-20 DIAGNOSIS — I10 Essential (primary) hypertension: Secondary | ICD-10-CM | POA: Diagnosis not present

## 2021-07-20 LAB — BAYER DCA HB A1C WAIVED: HB A1C (BAYER DCA - WAIVED): 7 % — ABNORMAL HIGH (ref 4.8–5.6)

## 2021-07-20 NOTE — Progress Notes (Signed)
? ?BP (!) 142/70   Pulse (!) 54   Ht '5\' 7"'$  (1.702 m)   Wt 176 lb (79.8 kg)   SpO2 100%   BMI 27.57 kg/m?   ? ?Subjective:  ? ?Patient ID: Shawn Lambert, male    DOB: Apr 15, 1940, 81 y.o.   MRN: [id number] ? ?HPI: ?Shawn Lambert is a 81 y.o. male presenting on 07/20/2021 for Medical Management of Chronic Issues, Diabetes, and Hypertension ? ? ?HPI ?Type 2 diabetes mellitus ?Patient comes in today for recheck of his diabetes. Patient has been currently taking no medication, has been diet controlled, A1c 7.0 which is slightly up, recommended to focus on diet.. Patient is currently on an ACE inhibitor/ARB. Patient has not seen an ophthalmologist this year. Patient denies any new issues with their feet, he does have bunions on both feet but they do not bother him significantly. The symptom started onset as an adult hyperlipidemia and hypertension ARE RELATED TO DM  ? ?Hyperlipidemia ?Patient is coming in for recheck of his hyperlipidemia. The patient is currently taking atorvastatin. They deny any issues with myalgias or history of liver damage from it. They deny any focal numbness or weakness or chest pain.  ? ?Hypertension ?Patient is currently on amlodipine and lisinopril, and their blood pressure today is 142/70. Patient denies any lightheadedness or dizziness. Patient denies headaches, blurred vision, chest pains, shortness of breath, or weakness. Denies any side effects from medication and is content with current medication.  ? ?Patient does complain of a globus sensation or faculty swallowing in the back of his throat.  He does admit that he is missed some of his heartburn medicine recently.  He denies any sinus drainage or allergies.  He does say that the biggest issue is when he is trying to swallow his pills. ? ?Relevant past medical, surgical, family and social history reviewed and updated as indicated. Interim medical history since our last visit reviewed. ?Allergies and medications reviewed and  updated. ? ?Review of Systems  ?Constitutional:  Negative for chills and fever.  ?HENT:  Positive for trouble swallowing. Negative for congestion, rhinorrhea, sinus pressure and sinus pain.   ?Respiratory:  Positive for cough. Negative for shortness of breath and wheezing.   ?Cardiovascular:  Negative for chest pain and leg swelling.  ?Gastrointestinal:  Negative for abdominal distention and abdominal pain.  ?Skin:  Negative for rash.  ?Neurological:  Negative for dizziness, weakness and numbness.  ?All other systems reviewed and are negative. ? ?Per HPI unless specifically indicated above ? ? ?Allergies as of 07/20/2021   ? ?   Reactions  ? Penicillins Itching  ? Has patient had a PCN reaction causing immediate rash, facial/tongue/throat swelling, SOB or lightheadedness with hypotension: no ?Has patient had a PCN reaction causing severe rash involving mucus membranes or skin necrosis: no ?Has patient had a PCN reaction that required hospitalization no ?Has patient had a PCN reaction occurring within the last 10 years: no ?If all of the above answers are "NO", then may proceed with Cephalosporin use.  ? Sulfonamide Derivatives Other (See Comments)  ? Reaction not known  ? ?  ? ?  ?Medication List  ?  ? ?  ? Accurate as of Jul 20, 2021  8:47 AM. If you have any questions, ask your nurse or doctor.  ?  ?  ? ?  ? ?amLODipine 5 MG tablet ?Commonly known as: NORVASC ?Take 1.5 tablets (7.5 mg total) by mouth daily. ?  ?aspirin EC 81  MG tablet ?Take 81 mg by mouth daily. ?  ?atorvastatin 40 MG tablet ?Commonly known as: LIPITOR ?Take 1 tablet (40 mg total) by mouth daily. ?  ?Calcium-Vitamin D 600-200 MG-UNIT Caps ?Take 2 tablets by mouth daily. Reported on 08/01/2015 ?  ?[facility] SPECTRAVITE ADULT 50+ PO ?Take 1 tablet by mouth daily. Reported on 08/01/2015 ?  ?Glucosamine-Chondroitin-MSM 750-400-375 MG Tabs ?Take 1 tablet by mouth daily. ?  ?lisinopril 10 MG tablet ?Commonly known as: ZESTRIL ?Take 1 tablet (10 mg total) by mouth  daily. ?  ?omeprazole 40 MG capsule ?Commonly known as: PRILOSEC ?Take 1 capsule (40 mg total) by mouth daily. ?  ?vitamin A 25000 UNIT capsule ?Take 25,000 Units by mouth daily. ?  ? ?  ? ? ? ?Objective:  ? ?BP (!) 142/70   Pulse (!) 54   Ht '5\' 7"'$  (1.702 m)   Wt 176 lb (79.8 kg)   SpO2 100%   BMI 27.57 kg/m?   ?Wt Readings from Last 3 Encounters:  ?07/20/21 176 lb (79.8 kg)  ?04/20/21 172 lb (78 kg)  ?01/26/21 170 lb (77.1 kg)  ?  ?Physical Exam ?Vitals and nursing note reviewed.  ?Constitutional:   ?   General: He is not in acute distress. ?   Appearance: He is well-developed. He is not diaphoretic.  ?Eyes:  ?   General: No scleral icterus. ?   Conjunctiva/sclera: Conjunctivae normal.  ?Neck:  ?   Thyroid: No thyromegaly.  ?Cardiovascular:  ?   Rate and Rhythm: Normal rate and regular rhythm.  ?   Heart sounds: Normal heart sounds. No murmur heard. ?Pulmonary:  ?   Effort: Pulmonary effort is normal. No respiratory distress.  ?   Breath sounds: Normal breath sounds. No wheezing.  ?Musculoskeletal:     ?   General: No swelling. Normal range of motion.  ?   Cervical back: Neck supple.  ?Lymphadenopathy:  ?   Cervical: No cervical adenopathy.  ?Skin: ?   General: Skin is warm and dry.  ?   Findings: No rash.  ?Neurological:  ?   Mental Status: He is alert and oriented to person, place, and time.  ?   Coordination: Coordination normal.  ?Psychiatric:     ?   Behavior: Behavior normal.  ? ? ? ? ?Assessment & Plan:  ? ?Problem List Items Addressed This Visit   ? ?  ? Cardiovascular and Mediastinum  ? Essential hypertension  ?  ? Endocrine  ? T2DM (type 2 diabetes mellitus) ([place]) - Primary  ? Relevant Orders  ? Bayer DCA Hb A1c Waived  ?  ? Other  ? Hyperlipidemia LDL goal <130  ?  ?A1c is up slightly at 7.0, recommend to focus on diet, we will keep a close eye but if continues to rise in the future we may need to start medicine. ?Follow up plan: ?Return in about 3 months (around 10/20/2021), or if symptoms worsen or  fail to improve, for Diabetes and hypertension and cholesterol. ? ?Counseling provided for all of the vaccine components ?Orders Placed This Encounter  ?Procedures  ? Bayer DCA Hb A1c Waived  ? ? ?Caryl Pina, MD ?[place] ?07/20/2021, 8:47 AM ? ? ? ? ?

## 2021-09-08 DIAGNOSIS — Z970 Presence of artificial eye: Secondary | ICD-10-CM | POA: Diagnosis not present

## 2021-09-08 DIAGNOSIS — Z961 Presence of intraocular lens: Secondary | ICD-10-CM | POA: Diagnosis not present

## 2021-10-26 ENCOUNTER — Encounter: Payer: Self-pay | Admitting: Family Medicine

## 2021-10-26 ENCOUNTER — Ambulatory Visit (INDEPENDENT_AMBULATORY_CARE_PROVIDER_SITE_OTHER): Payer: Medicare Other | Admitting: Family Medicine

## 2021-10-26 VITALS — BP 138/67 | HR 54 | Temp 97.3°F | Ht 67.0 in | Wt 176.0 lb

## 2021-10-26 DIAGNOSIS — I10 Essential (primary) hypertension: Secondary | ICD-10-CM | POA: Diagnosis not present

## 2021-10-26 DIAGNOSIS — K219 Gastro-esophageal reflux disease without esophagitis: Secondary | ICD-10-CM

## 2021-10-26 DIAGNOSIS — E119 Type 2 diabetes mellitus without complications: Secondary | ICD-10-CM | POA: Diagnosis not present

## 2021-10-26 DIAGNOSIS — E785 Hyperlipidemia, unspecified: Secondary | ICD-10-CM

## 2021-10-26 LAB — BAYER DCA HB A1C WAIVED: HB A1C (BAYER DCA - WAIVED): 6.9 % — ABNORMAL HIGH (ref 4.8–5.6)

## 2021-10-26 NOTE — Progress Notes (Signed)
BP 138/67   Pulse (!) 54   Temp (!) 97.3 F (36.3 C)   Ht '5\' 7"'  (1.702 m)   Wt 176 lb (79.8 kg)   SpO2 100%   BMI 27.57 kg/m    Subjective:   Patient ID: Shawn Lambert, male    DOB: [birth year], 81 y.o.   MRN: [id number]  HPI: Shawn Lambert is a 81 y.o. male presenting on 10/26/2021 for Medical Management of Chronic Issues, Diabetes, and Hypertension   HPI Type 2 diabetes mellitus Patient comes in today for recheck of his diabetes. Patient has been currently taking no medication, diet controlled, A1c 6.9. Patient is currently on an ACE inhibitor/ARB. Patient has not seen an ophthalmologist this year. Patient denies any issues with their feet. The symptom started onset as an adult hypertension and hyperlipidemia ARE RELATED TO DM   Hypertension Patient is currently on amlodipine and lisinopril, and their blood pressure today is 138/67. Patient denies any lightheadedness or dizziness. Patient denies headaches, blurred vision, chest pains, shortness of breath, or weakness. Denies any side effects from medication and is content with current medication.   Hyperlipidemia Patient is coming in for recheck of his hyperlipidemia. The patient is currently taking atorvastatin. They deny any issues with myalgias or history of liver damage from it. They deny any focal numbness or weakness or chest pain.   GERD Patient is currently on omeprazole.  She denies any major symptoms or abdominal pain or belching or burping. She denies any blood in her stool or lightheadedness or dizziness.   Relevant past medical, surgical, family and social history reviewed and updated as indicated. Interim medical history since our last visit reviewed. Allergies and medications reviewed and updated.  Review of Systems  Constitutional:  Negative for chills and fever.  Respiratory:  Negative for shortness of breath and wheezing.   Cardiovascular:  Negative for chest pain and leg swelling.  Musculoskeletal:   Negative for back pain and gait problem.  Skin:  Negative for rash.  Neurological:  Negative for dizziness, weakness and light-headedness.  All other systems reviewed and are negative.   Per HPI unless specifically indicated above   Allergies as of 10/26/2021       Reactions   Penicillins Itching   Has patient had a PCN reaction causing immediate rash, facial/tongue/throat swelling, SOB or lightheadedness with hypotension: no Has patient had a PCN reaction causing severe rash involving mucus membranes or skin necrosis: no Has patient had a PCN reaction that required hospitalization no Has patient had a PCN reaction occurring within the last 10 years: no If all of the above answers are "NO", then may proceed with Cephalosporin use.   Sulfonamide Derivatives Other (See Comments)   Reaction not known        Medication List        Accurate as of October 26, 2021 10:36 AM. If you have any questions, ask your nurse or doctor.          amLODipine 5 MG tablet Commonly known as: NORVASC Take 1.5 tablets (7.5 mg total) by mouth daily.   aspirin EC 81 MG tablet Take 81 mg by mouth daily.   atorvastatin 40 MG tablet Commonly known as: LIPITOR Take 1 tablet (40 mg total) by mouth daily.   Calcium-Vitamin D 600-200 MG-UNIT Caps Take 2 tablets by mouth daily. Reported on 08/01/2015   [facility]S SPECTRAVITE ADULT 50+ PO Take 1 tablet by mouth daily. Reported on 08/01/2015   Glucosamine-Chondroitin-MSM 469-629-528  MG Tabs Take 1 tablet by mouth daily.   lisinopril 10 MG tablet Commonly known as: ZESTRIL Take 1 tablet (10 mg total) by mouth daily.   omeprazole 40 MG capsule Commonly known as: PRILOSEC Take 1 capsule (40 mg total) by mouth daily.   vitamin A 25000 UNIT capsule Take 25,000 Units by mouth daily.         Objective:   BP 138/67   Pulse (!) 54   Temp (!) 97.3 F (36.3 C)   Ht '5\' 7"'  (1.702 m)   Wt 176 lb (79.8 kg)   SpO2 100%   BMI 27.57 kg/m   Wt  Readings from Last 3 Encounters:  10/26/21 176 lb (79.8 kg)  07/20/21 176 lb (79.8 kg)  04/20/21 172 lb (78 kg)    Physical Exam Vitals and nursing note reviewed.  Constitutional:      General: He is not in acute distress.    Appearance: He is well-developed. He is not diaphoretic.  Eyes:     General: No scleral icterus.    Conjunctiva/sclera: Conjunctivae normal.  Neck:     Thyroid: No thyromegaly.  Cardiovascular:     Rate and Rhythm: Normal rate and regular rhythm.     Heart sounds: Normal heart sounds. No murmur heard. Pulmonary:     Effort: Pulmonary effort is normal. No respiratory distress.     Breath sounds: Normal breath sounds. No wheezing.  Musculoskeletal:        General: No swelling. Normal range of motion.     Cervical back: Neck supple.  Lymphadenopathy:     Cervical: No cervical adenopathy.  Skin:    General: Skin is warm and dry.     Findings: No rash.  Neurological:     Mental Status: He is alert and oriented to person, place, and time.     Coordination: Coordination normal.  Psychiatric:        Behavior: Behavior normal.       Assessment & Plan:   Problem List Items Addressed This Visit       Cardiovascular and Mediastinum   Essential hypertension   Relevant Orders   CBC with Differential/Platelet   CMP14+EGFR   Lipid panel   Bayer DCA Hb A1c Waived     Digestive   GERD     Endocrine   T2DM (type 2 diabetes mellitus) ([place]) - Primary   Relevant Orders   CBC with Differential/Platelet   CMP14+EGFR   Lipid panel   Bayer DCA Hb A1c Waived     Other   Hyperlipidemia LDL goal <130   Relevant Orders   CBC with Differential/Platelet   CMP14+EGFR   Lipid panel   Bayer DCA Hb A1c Waived    A1c looks good at 6.9.  Continue diet control.  Blood pressure looks decent.  No changes Follow up plan: Return in about 3 months (around 01/26/2022), or if symptoms worsen or fail to improve, for DM adn HTN and HLD.  Counseling provided for all of  the vaccine components Orders Placed This Encounter  Procedures   CBC with Differential/Platelet   CMP14+EGFR   Lipid panel   Bayer DCA Hb A1c Waived    Caryl Pina, MD [facility] 10/26/2021, 10:36 AM

## 2021-10-27 ENCOUNTER — Other Ambulatory Visit: Payer: Self-pay | Admitting: *Deleted

## 2021-10-27 DIAGNOSIS — R899 Unspecified abnormal finding in specimens from other organs, systems and tissues: Secondary | ICD-10-CM

## 2021-10-27 LAB — CBC WITH DIFFERENTIAL/PLATELET
Basophils Absolute: 0 10*3/uL (ref 0.0–0.2)
Basos: 0 %
EOS (ABSOLUTE): 0.2 10*3/uL (ref 0.0–0.4)
Eos: 4 %
Hematocrit: 30.3 % — ABNORMAL LOW (ref 37.5–51.0)
Hemoglobin: 9.9 g/dL — ABNORMAL LOW (ref 13.0–17.7)
Immature Grans (Abs): 0 10*3/uL (ref 0.0–0.1)
Immature Granulocytes: 0 %
Lymphocytes Absolute: 0.9 10*3/uL (ref 0.7–3.1)
Lymphs: 15 %
MCH: 28.9 pg (ref 26.6–33.0)
MCHC: 32.7 g/dL (ref 31.5–35.7)
MCV: 89 fL (ref 79–97)
Monocytes Absolute: 0.5 10*3/uL (ref 0.1–0.9)
Monocytes: 9 %
Neutrophils Absolute: 4.2 10*3/uL (ref 1.4–7.0)
Neutrophils: 72 %
Platelets: 233 10*3/uL (ref 150–450)
RBC: 3.42 x10E6/uL — ABNORMAL LOW (ref 4.14–5.80)
RDW: 13.7 % (ref 11.6–15.4)
WBC: 5.8 10*3/uL (ref 3.4–10.8)

## 2021-10-27 LAB — CMP14+EGFR
ALT: 16 IU/L (ref 0–44)
AST: 18 IU/L (ref 0–40)
Albumin/Globulin Ratio: 1.7 (ref 1.2–2.2)
Albumin: 3.9 g/dL (ref 3.8–4.8)
Alkaline Phosphatase: 93 IU/L (ref 44–121)
BUN/Creatinine Ratio: 23 (ref 10–24)
BUN: 25 mg/dL (ref 8–27)
Bilirubin Total: 0.3 mg/dL (ref 0.0–1.2)
CO2: 20 mmol/L (ref 20–29)
Calcium: 9 mg/dL (ref 8.6–10.2)
Chloride: 107 mmol/L — ABNORMAL HIGH (ref 96–106)
Creatinine, Ser: 1.09 mg/dL (ref 0.76–1.27)
Globulin, Total: 2.3 g/dL (ref 1.5–4.5)
Glucose: 151 mg/dL — ABNORMAL HIGH (ref 70–99)
Potassium: 4.8 mmol/L (ref 3.5–5.2)
Sodium: 142 mmol/L (ref 134–144)
Total Protein: 6.2 g/dL (ref 6.0–8.5)
eGFR: 69 mL/min/{1.73_m2} (ref >59)

## 2021-10-27 LAB — LIPID PANEL
Chol/HDL Ratio: 2.9 ratio (ref 0.0–5.0)
Cholesterol, Total: 146 mg/dL (ref 100–199)
HDL: 50 mg/dL (ref >39)
LDL Chol Calc (NIH): 88 mg/dL (ref 0–99)
Triglycerides: 30 mg/dL (ref 0–149)
VLDL Cholesterol Cal: 8 mg/dL (ref 5–40)

## 2021-10-30 ENCOUNTER — Other Ambulatory Visit: Payer: Medicare Other

## 2021-10-30 DIAGNOSIS — R899 Unspecified abnormal finding in specimens from other organs, systems and tissues: Secondary | ICD-10-CM

## 2021-10-30 LAB — CBC WITH DIFFERENTIAL/PLATELET
Basophils Absolute: 0 10*3/uL (ref 0.0–0.2)
Basos: 1 %
EOS (ABSOLUTE): 0.3 10*3/uL (ref 0.0–0.4)
Eos: 5 %
Hematocrit: 32.1 % — ABNORMAL LOW (ref 37.5–51.0)
Hemoglobin: 10.1 g/dL — ABNORMAL LOW (ref 13.0–17.7)
Immature Grans (Abs): 0 10*3/uL (ref 0.0–0.1)
Immature Granulocytes: 0 %
Lymphocytes Absolute: 0.9 10*3/uL (ref 0.7–3.1)
Lymphs: 19 %
MCH: 28.7 pg (ref 26.6–33.0)
MCHC: 31.5 g/dL (ref 31.5–35.7)
MCV: 91 fL (ref 79–97)
Monocytes Absolute: 0.5 10*3/uL (ref 0.1–0.9)
Monocytes: 9 %
Neutrophils Absolute: 3.4 10*3/uL (ref 1.4–7.0)
Neutrophils: 66 %
Platelets: 243 10*3/uL (ref 150–450)
RBC: 3.52 x10E6/uL — ABNORMAL LOW (ref 4.14–5.80)
RDW: 14.1 % (ref 11.6–15.4)
WBC: 5.1 10*3/uL (ref 3.4–10.8)

## 2021-11-02 ENCOUNTER — Ambulatory Visit: Payer: Medicare Other

## 2022-01-29 ENCOUNTER — Ambulatory Visit (INDEPENDENT_AMBULATORY_CARE_PROVIDER_SITE_OTHER): Payer: Medicare Other | Admitting: Family Medicine

## 2022-01-29 ENCOUNTER — Encounter: Payer: Self-pay | Admitting: Family Medicine

## 2022-01-29 VITALS — BP 150/69 | HR 55 | Temp 97.6°F | Ht 67.0 in | Wt 173.0 lb

## 2022-01-29 DIAGNOSIS — E785 Hyperlipidemia, unspecified: Secondary | ICD-10-CM

## 2022-01-29 DIAGNOSIS — E119 Type 2 diabetes mellitus without complications: Secondary | ICD-10-CM

## 2022-01-29 DIAGNOSIS — I10 Essential (primary) hypertension: Secondary | ICD-10-CM | POA: Diagnosis not present

## 2022-01-29 DIAGNOSIS — Z1211 Encounter for screening for malignant neoplasm of colon: Secondary | ICD-10-CM

## 2022-01-29 DIAGNOSIS — D649 Anemia, unspecified: Secondary | ICD-10-CM

## 2022-01-29 LAB — CBC WITH DIFFERENTIAL/PLATELET
Basophils Absolute: 0 10*3/uL (ref 0.0–0.2)
Basos: 1 %
EOS (ABSOLUTE): 0.2 10*3/uL (ref 0.0–0.4)
Eos: 6 %
Hematocrit: 29.1 % — ABNORMAL LOW (ref 37.5–51.0)
Hemoglobin: 9.1 g/dL — ABNORMAL LOW (ref 13.0–17.7)
Immature Grans (Abs): 0 10*3/uL (ref 0.0–0.1)
Immature Granulocytes: 0 %
Lymphocytes Absolute: 0.8 10*3/uL (ref 0.7–3.1)
Lymphs: 22 %
MCH: 26.9 pg (ref 26.6–33.0)
MCHC: 31.3 g/dL — ABNORMAL LOW (ref 31.5–35.7)
MCV: 86 fL (ref 79–97)
Monocytes Absolute: 0.5 10*3/uL (ref 0.1–0.9)
Monocytes: 13 %
Neutrophils Absolute: 2.2 10*3/uL (ref 1.4–7.0)
Neutrophils: 58 %
Platelets: 231 10*3/uL (ref 150–450)
RBC: 3.38 x10E6/uL — ABNORMAL LOW (ref 4.14–5.80)
RDW: 12.9 % (ref 11.6–15.4)
WBC: 3.8 10*3/uL (ref 3.4–10.8)

## 2022-01-29 LAB — BAYER DCA HB A1C WAIVED: HB A1C (BAYER DCA - WAIVED): 7.2 % — ABNORMAL HIGH (ref 4.8–5.6)

## 2022-01-29 NOTE — Progress Notes (Addendum)
BP (!) 150/69   Pulse (!) 55   Temp 97.6 F (36.4 C)   Ht '5\' 7"'$  (1.702 m)   Wt 173 lb (78.5 kg)   SpO2 99%   BMI 27.10 kg/m    Subjective:   Patient ID: Shawn Lambert, male    DOB: [birth year], 81 y.o.   MRN: [id number]  HPI: Shawn Lambert is a 81 y.o. male presenting on 01/29/2022 for Medical Management of Chronic Issues, Hyperlipidemia, Hypertension, and Diabetes   HPI Hypertension Patient is currently on amlodipine and lisinopril, and their blood pressure today is 150/69.  His home blood pressures that are checked every day is 139 and 140 over 60s. Patient denies any lightheadedness or dizziness. Patient denies headaches, blurred vision, chest pains, shortness of breath, or weakness. Denies any side effects from medication and is content with current medication.   Type 2 diabetes mellitus Patient comes in today for recheck of his diabetes. Patient has been currently taking no medicine currently, diet control. Patient is currently on an ACE inhibitor/ARB. Patient has seen an ophthalmologist this year. Patient denies any issues with their feet. The symptom started onset as an adult hyperlipidemia and hypertension ARE RELATED TO DM   Hyperlipidemia Patient is coming in for recheck of his hyperlipidemia. The patient is currently taking atorvastatin. They deny any issues with myalgias or history of liver damage from it. They deny any focal numbness or weakness or chest pain.   Relevant past medical, surgical, family and social history reviewed and updated as indicated. Interim medical history since our last visit reviewed. Allergies and medications reviewed and updated.  Review of Systems  Constitutional:  Negative for chills and fever.  Eyes:  Negative for visual disturbance.  Respiratory:  Negative for shortness of breath and wheezing.   Cardiovascular:  Negative for chest pain and leg swelling.  Musculoskeletal:  Negative for back pain and gait problem.  Skin:  Negative for  rash.  Neurological:  Negative for dizziness, weakness and light-headedness.  All other systems reviewed and are negative.   Per HPI unless specifically indicated above   Allergies as of 01/29/2022       Reactions   Penicillins Itching   Has patient had a PCN reaction causing immediate rash, facial/tongue/throat swelling, SOB or lightheadedness with hypotension: no Has patient had a PCN reaction causing severe rash involving mucus membranes or skin necrosis: no Has patient had a PCN reaction that required hospitalization no Has patient had a PCN reaction occurring within the last 10 years: no If all of the above answers are "NO", then may proceed with Cephalosporin use.   Sulfonamide Derivatives Other (See Comments)   Reaction not known        Medication List        Accurate as of January 29, 2022  8:56 AM. If you have any questions, ask your nurse or doctor.          amLODipine 5 MG tablet Commonly known as: NORVASC Take 1.5 tablets (7.5 mg total) by mouth daily.   aspirin EC 81 MG tablet Take 81 mg by mouth daily.   atorvastatin 40 MG tablet Commonly known as: LIPITOR Take 1 tablet (40 mg total) by mouth daily.   Calcium-Vitamin D 600-200 MG-UNIT Caps Take 2 tablets by mouth daily. Reported on 08/01/2015   [employer] SPECTRAVITE ADULT 50+ PO Take 1 tablet by mouth daily. Reported on 08/01/2015   Glucosamine-Chondroitin-MSM 750-400-375 MG Tabs Take 1 tablet by mouth daily.  lisinopril 10 MG tablet Commonly known as: ZESTRIL Take 1 tablet (10 mg total) by mouth daily.   omeprazole 40 MG capsule Commonly known as: PRILOSEC Take 1 capsule (40 mg total) by mouth daily.   vitamin A 25000 UNIT capsule Take 25,000 Units by mouth daily.         Objective:   BP (!) 150/69   Pulse (!) 55   Temp 97.6 F (36.4 C)   Ht '5\' 7"'$  (1.702 m)   Wt 173 lb (78.5 kg)   SpO2 99%   BMI 27.10 kg/m   Wt Readings from Last 3 Encounters:  01/29/22 173 lb (78.5 kg)   10/26/21 176 lb (79.8 kg)  07/20/21 176 lb (79.8 kg)    Physical Exam Vitals and nursing note reviewed.  Constitutional:      General: He is not in acute distress.    Appearance: He is well-developed. He is not diaphoretic.  Eyes:     General: No scleral icterus.    Conjunctiva/sclera: Conjunctivae normal.  Neck:     Thyroid: No thyromegaly.  Cardiovascular:     Rate and Rhythm: Normal rate and regular rhythm.     Heart sounds: Normal heart sounds. No murmur heard. Pulmonary:     Effort: Pulmonary effort is normal. No respiratory distress.     Breath sounds: Normal breath sounds. No wheezing.  Musculoskeletal:        General: No swelling. Normal range of motion.     Cervical back: Neck supple.  Lymphadenopathy:     Cervical: No cervical adenopathy.  Skin:    General: Skin is warm and dry.     Findings: No rash.  Neurological:     Mental Status: He is alert and oriented to person, place, and time.     Coordination: Coordination normal.  Psychiatric:        Behavior: Behavior normal.       Assessment & Plan:   Problem List Items Addressed This Visit       Cardiovascular and Mediastinum   Essential hypertension     Endocrine   T2DM (type 2 diabetes mellitus) ([place]) - Primary   Relevant Orders   CBC with Differential/Platelet   Bayer DCA Hb A1c Waived   Microalbumin / creatinine urine ratio     Other   Hyperlipidemia LDL goal <130   Other Visit Diagnoses     Colon cancer screening           Patient would not qualify because of history of polyps.  A1c slightly up at 7.2, recommended diet, no medication changes at this point. Follow up plan: Return if symptoms worsen or fail to improve, for 45-monthdiabetes.  Counseling provided for all of the vaccine components Orders Placed This Encounter  Procedures   CBC with Differential/Platelet   Bayer DCA Hb A1c Waived   Microalbumin / creatinine urine ratio    JCaryl Pina MD [facility] 01/29/2022, 8:56 AM

## 2022-01-30 LAB — MICROALBUMIN / CREATININE URINE RATIO
Creatinine, Urine: 73.7 mg/dL
Microalb/Creat Ratio: <4 mg/g creat (ref 0–29)
Microalbumin, Urine: <3 ug/mL

## 2022-02-06 ENCOUNTER — Other Ambulatory Visit: Payer: Self-pay

## 2022-02-06 DIAGNOSIS — R710 Precipitous drop in hematocrit: Secondary | ICD-10-CM

## 2022-02-06 NOTE — Progress Notes (Signed)
Future order for CBC placed per lab result note from [facility]

## 2022-02-19 ENCOUNTER — Other Ambulatory Visit: Payer: Medicare Other

## 2022-02-19 DIAGNOSIS — R710 Precipitous drop in hematocrit: Secondary | ICD-10-CM

## 2022-02-19 LAB — CBC WITH DIFFERENTIAL/PLATELET
Basophils Absolute: 0 10*3/uL (ref 0.0–0.2)
Basos: 1 %
EOS (ABSOLUTE): 0.2 10*3/uL (ref 0.0–0.4)
Eos: 5 %
Hematocrit: 28.4 % — ABNORMAL LOW (ref 37.5–51.0)
Hemoglobin: 8.7 g/dL — CL (ref 13.0–17.7)
Immature Grans (Abs): 0 10*3/uL (ref 0.0–0.1)
Immature Granulocytes: 0 %
Lymphocytes Absolute: 0.8 10*3/uL (ref 0.7–3.1)
Lymphs: 20 %
MCH: 25.9 pg — ABNORMAL LOW (ref 26.6–33.0)
MCHC: 30.6 g/dL — ABNORMAL LOW (ref 31.5–35.7)
MCV: 85 fL (ref 79–97)
Monocytes Absolute: 0.4 10*3/uL (ref 0.1–0.9)
Monocytes: 10 %
Neutrophils Absolute: 2.6 10*3/uL (ref 1.4–7.0)
Neutrophils: 64 %
Platelets: 259 10*3/uL (ref 150–450)
RBC: 3.36 x10E6/uL — ABNORMAL LOW (ref 4.14–5.80)
RDW: 13.3 % (ref 11.6–15.4)
WBC: 4 10*3/uL (ref 3.4–10.8)

## 2022-02-21 NOTE — Addendum Note (Signed)
Addended by: Geryl Rankins D on: 02/21/2022 10:35 AM   Modules accepted: Orders

## 2022-02-28 ENCOUNTER — Other Ambulatory Visit: Payer: Medicare Other

## 2022-02-28 DIAGNOSIS — D649 Anemia, unspecified: Secondary | ICD-10-CM

## 2022-02-28 LAB — CBC WITH DIFFERENTIAL/PLATELET
Basophils Absolute: 0 10*3/uL (ref 0.0–0.2)
Basos: 1 %
EOS (ABSOLUTE): 0.2 10*3/uL (ref 0.0–0.4)
Eos: 5 %
Hematocrit: 27 % — ABNORMAL LOW (ref 37.5–51.0)
Hemoglobin: 8.4 g/dL — CL (ref 13.0–17.7)
Immature Grans (Abs): 0 10*3/uL (ref 0.0–0.1)
Immature Granulocytes: 0 %
Lymphocytes Absolute: 0.8 10*3/uL (ref 0.7–3.1)
Lymphs: 22 %
MCH: 26.5 pg — ABNORMAL LOW (ref 26.6–33.0)
MCHC: 31.1 g/dL — ABNORMAL LOW (ref 31.5–35.7)
MCV: 85 fL (ref 79–97)
Monocytes Absolute: 0.4 10*3/uL (ref 0.1–0.9)
Monocytes: 12 %
Neutrophils Absolute: 2.3 10*3/uL (ref 1.4–7.0)
Neutrophils: 60 %
Platelets: 271 10*3/uL (ref 150–450)
RBC: 3.17 x10E6/uL — ABNORMAL LOW (ref 4.14–5.80)
RDW: 14 % (ref 11.6–15.4)
WBC: 3.7 10*3/uL (ref 3.4–10.8)

## 2022-05-02 ENCOUNTER — Encounter: Payer: Self-pay | Admitting: Family Medicine

## 2022-05-02 ENCOUNTER — Ambulatory Visit (INDEPENDENT_AMBULATORY_CARE_PROVIDER_SITE_OTHER): Payer: Medicare Other | Admitting: Family Medicine

## 2022-05-02 VITALS — BP 124/70 | HR 58 | Ht 67.0 in | Wt 171.0 lb

## 2022-05-02 DIAGNOSIS — R350 Frequency of micturition: Secondary | ICD-10-CM

## 2022-05-02 DIAGNOSIS — E119 Type 2 diabetes mellitus without complications: Secondary | ICD-10-CM

## 2022-05-02 DIAGNOSIS — K219 Gastro-esophageal reflux disease without esophagitis: Secondary | ICD-10-CM | POA: Diagnosis not present

## 2022-05-02 DIAGNOSIS — E785 Hyperlipidemia, unspecified: Secondary | ICD-10-CM | POA: Diagnosis not present

## 2022-05-02 DIAGNOSIS — N401 Enlarged prostate with lower urinary tract symptoms: Secondary | ICD-10-CM | POA: Insufficient documentation

## 2022-05-02 DIAGNOSIS — I10 Essential (primary) hypertension: Secondary | ICD-10-CM

## 2022-05-02 LAB — BAYER DCA HB A1C WAIVED: HB A1C (BAYER DCA - WAIVED): 7.2 % — ABNORMAL HIGH (ref 4.8–5.6)

## 2022-05-02 MED ORDER — TAMSULOSIN HCL 0.4 MG PO CAPS: 0.4000 mg | ORAL_CAPSULE | Freq: Every day | ORAL | 3 refills | Status: DC

## 2022-05-02 MED ORDER — LISINOPRIL 10 MG PO TABS: 10.0000 mg | ORAL_TABLET | Freq: Every day | ORAL | 3 refills | Status: DC

## 2022-05-02 MED ORDER — OMEPRAZOLE 40 MG PO CPDR: 40.0000 mg | DELAYED_RELEASE_CAPSULE | Freq: Every day | ORAL | 3 refills | Status: DC

## 2022-05-02 MED ORDER — ATORVASTATIN CALCIUM 40 MG PO TABS: 40.0000 mg | ORAL_TABLET | Freq: Every day | ORAL | 3 refills | Status: DC

## 2022-05-02 MED ORDER — AMLODIPINE BESYLATE 5 MG PO TABS: 7.5000 mg | ORAL_TABLET | Freq: Every day | ORAL | 3 refills | Status: DC

## 2022-05-02 NOTE — Progress Notes (Signed)
BP 124/70   Pulse (!) 58   Ht 5' 7"$  (1.702 m)   Wt 171 lb (77.6 kg)   SpO2 100%   BMI 26.78 kg/m    Subjective:   Patient ID: Shawn Lambert, male    DOB: January 23, 1941, 82 y.o.   MRN: [id number]  HPI: Shawn Lambert is a 82 y.o. male presenting on 05/02/2022 for Medical Management of Chronic Issues, Diabetes, Hyperlipidemia, and Hypertension   HPI Type 2 diabetes mellitus Patient comes in today for recheck of his diabetes. Patient has been currently taking No medicine for diabetes, has been diet controlled. Patient is currently on an ACE inhibitor/ARB. Patient has not seen an ophthalmologist this year. Patient denies any issues with their feet. The symptom started onset as an adult hyperlipidemia and hypertension ARE RELATED TO DM   Hyperlipidemia Patient is coming in for recheck of his hyperlipidemia. The patient is currently taking atorvastatin. They deny any issues with myalgias or history of liver damage from it. They deny any focal numbness or weakness or chest pain.   GERD Patient is currently on omeprazole.  She denies any major symptoms or abdominal pain or belching or burping. She denies any blood in her stool or lightheadedness or dizziness.   Hypertension Patient is currently on lisinopril and amlodipine, and their blood pressure today is 124/70. Patient denies any lightheadedness or dizziness today, but did have 1 episode that he describes about a month ago where he was out working outside and felt dizzy and nausea that lasted a couple hours.  He says when he checked it his blood pressure was 106/32.  He denies any chest pain or shortness of breath or anything like that during the episode.  He just felt some dizziness lightheadedness and nausea and then it passed and has not returned.. Patient denies headaches, blurred vision, chest pains, shortness of breath, or weakness. Denies any side effects from medication and is content with current medication.   Patient comes in today  complaining of urinary hesitancy and urinary frequency.  He says it has been gradually increasing over the past few years but has gotten to the point where when he has to go he has to go fast.  He says he does have a little bit anxious similar to that with the bowel but mostly is the urinary issues.  Relevant past medical, surgical, family and social history reviewed and updated as indicated. Interim medical history since our last visit reviewed. Allergies and medications reviewed and updated.  Review of Systems  Constitutional:  Negative for chills and fever.  Eyes:  Negative for visual disturbance.  Respiratory:  Negative for shortness of breath and wheezing.   Cardiovascular:  Negative for chest pain and leg swelling.  Gastrointestinal:  Positive for nausea.  Musculoskeletal:  Negative for back pain and gait problem.  Skin:  Negative for rash.  Neurological:  Positive for dizziness and light-headedness. Negative for numbness.  All other systems reviewed and are negative.   Per HPI unless specifically indicated above   Allergies as of 05/02/2022       Reactions   Penicillins Itching   Has patient had a PCN reaction causing immediate rash, facial/tongue/throat swelling, SOB or lightheadedness with hypotension: no Has patient had a PCN reaction causing severe rash involving mucus membranes or skin necrosis: no Has patient had a PCN reaction that required hospitalization no Has patient had a PCN reaction occurring within the last 10 years: no If all of the above  answers are "NO", then may proceed with Cephalosporin use.   Sulfonamide Derivatives Other (See Comments)   Reaction not known        Medication List        Accurate as of May 02, 2022  8:27 AM. If you have any questions, ask your nurse or doctor.          amLODipine 5 MG tablet Commonly known as: NORVASC Take 1.5 tablets (7.5 mg total) by mouth daily.   aspirin EC 81 MG tablet Take 81 mg by mouth daily.    atorvastatin 40 MG tablet Commonly known as: LIPITOR Take 1 tablet (40 mg total) by mouth daily.   Calcium-Vitamin D 600-200 MG-UNIT Caps Take 2 tablets by mouth daily. Reported on 08/01/2015   [facility] SPECTRAVITE ADULT 50+ PO Take 1 tablet by mouth daily. Reported on 08/01/2015   Glucosamine-Chondroitin-MSM 750-400-375 MG Tabs Take 1 tablet by mouth daily.   lisinopril 10 MG tablet Commonly known as: ZESTRIL Take 1 tablet (10 mg total) by mouth daily.   omeprazole 40 MG capsule Commonly known as: PRILOSEC Take 1 capsule (40 mg total) by mouth daily.   vitamin A 25000 UNIT capsule Take 25,000 Units by mouth daily.   vitamin B-12 50 MCG tablet Commonly known as: CYANOCOBALAMIN Take 50 mcg by mouth daily.         Objective:   BP 124/70   Pulse (!) 58   Ht 5' 7"$  (1.702 m)   Wt 171 lb (77.6 kg)   SpO2 100%   BMI 26.78 kg/m   Wt Readings from Last 3 Encounters:  05/02/22 171 lb (77.6 kg)  01/29/22 173 lb (78.5 kg)  10/26/21 176 lb (79.8 kg)    Physical Exam Vitals and nursing note reviewed.  Constitutional:      General: He is not in acute distress.    Appearance: He is well-developed. He is not diaphoretic.  Eyes:     General: No scleral icterus.    Conjunctiva/sclera: Conjunctivae normal.  Neck:     Thyroid: No thyromegaly.  Cardiovascular:     Rate and Rhythm: Normal rate and regular rhythm.     Heart sounds: Normal heart sounds. No murmur heard. Pulmonary:     Effort: Pulmonary effort is normal. No respiratory distress.     Breath sounds: Normal breath sounds. No wheezing.  Musculoskeletal:     Cervical back: Neck supple.  Lymphadenopathy:     Cervical: No cervical adenopathy.  Skin:    General: Skin is warm and dry.     Findings: No rash.  Neurological:     Mental Status: He is alert and oriented to person, place, and time.     Coordination: Coordination normal.  Psychiatric:        Behavior: Behavior normal.       Assessment & Plan:    Problem List Items Addressed This Visit       Cardiovascular and Mediastinum   Essential hypertension   Relevant Medications   amLODipine (NORVASC) 5 MG tablet   atorvastatin (LIPITOR) 40 MG tablet   lisinopril (ZESTRIL) 10 MG tablet   Other Relevant Orders   CBC with Differential/Platelet   CMP14+EGFR   Lipid panel   Bayer DCA Hb A1c Waived     Digestive   GERD   Relevant Medications   omeprazole (PRILOSEC) 40 MG capsule     Endocrine   T2DM (type 2 diabetes mellitus) ([place]) - Primary   Relevant Medications   atorvastatin (  LIPITOR) 40 MG tablet   lisinopril (ZESTRIL) 10 MG tablet   Other Relevant Orders   CBC with Differential/Platelet   CMP14+EGFR   Lipid panel   Bayer DCA Hb A1c Waived     Other   Hyperlipidemia LDL goal <130   Relevant Medications   amLODipine (NORVASC) 5 MG tablet   atorvastatin (LIPITOR) 40 MG tablet   lisinopril (ZESTRIL) 10 MG tablet   Other Relevant Orders   CBC with Differential/Platelet   CMP14+EGFR   Lipid panel   Bayer DCA Hb A1c Waived   Benign prostatic hyperplasia with urinary frequency    Patient also has anemia and sees hematology but we will recheck his blood counts today. Follow up plan: Return in about 3 months (around 07/31/2022), or if symptoms worsen or fail to improve, for Diabetes hypertension cholesterol.  Counseling provided for all of the vaccine components Orders Placed This Encounter  Procedures   CBC with Differential/Platelet   CMP14+EGFR   Lipid panel   Bayer DCA Hb A1c Waived    Caryl Pina, MD [facility] 05/02/2022, 8:27 AM

## 2022-05-03 LAB — CMP14+EGFR
ALT: 15 IU/L (ref 0–44)
AST: 18 IU/L (ref 0–40)
Albumin/Globulin Ratio: 2 (ref 1.2–2.2)
Albumin: 4.2 g/dL (ref 3.7–4.7)
Alkaline Phosphatase: 103 IU/L (ref 44–121)
BUN/Creatinine Ratio: 15 (ref 10–24)
BUN: 20 mg/dL (ref 8–27)
Bilirubin Total: 0.3 mg/dL (ref 0.0–1.2)
CO2: 25 mmol/L (ref 20–29)
Calcium: 9.4 mg/dL (ref 8.6–10.2)
Chloride: 104 mmol/L (ref 96–106)
Creatinine, Ser: 1.32 mg/dL — ABNORMAL HIGH (ref 0.76–1.27)
Globulin, Total: 2.1 g/dL (ref 1.5–4.5)
Glucose: 126 mg/dL — ABNORMAL HIGH (ref 70–99)
Potassium: 5.4 mmol/L — ABNORMAL HIGH (ref 3.5–5.2)
Sodium: 140 mmol/L (ref 134–144)
Total Protein: 6.3 g/dL (ref 6.0–8.5)
eGFR: 54 mL/min/{1.73_m2} — ABNORMAL LOW (ref >59)

## 2022-05-03 LAB — CBC WITH DIFFERENTIAL/PLATELET
Basophils Absolute: 0 10*3/uL (ref 0.0–0.2)
Basos: 1 %
EOS (ABSOLUTE): 0.3 10*3/uL (ref 0.0–0.4)
Eos: 6 %
Hematocrit: 39.8 % (ref 37.5–51.0)
Hemoglobin: 12.6 g/dL — ABNORMAL LOW (ref 13.0–17.7)
Immature Grans (Abs): 0 10*3/uL (ref 0.0–0.1)
Immature Granulocytes: 0 %
Lymphocytes Absolute: 1 10*3/uL (ref 0.7–3.1)
Lymphs: 23 %
MCH: 28.5 pg (ref 26.6–33.0)
MCHC: 31.7 g/dL (ref 31.5–35.7)
MCV: 90 fL (ref 79–97)
Monocytes Absolute: 0.5 10*3/uL (ref 0.1–0.9)
Monocytes: 12 %
Neutrophils Absolute: 2.5 10*3/uL (ref 1.4–7.0)
Neutrophils: 58 %
Platelets: 212 10*3/uL (ref 150–450)
RBC: 4.42 x10E6/uL (ref 4.14–5.80)
RDW: 17.2 % — ABNORMAL HIGH (ref 11.6–15.4)
WBC: 4.2 10*3/uL (ref 3.4–10.8)

## 2022-05-03 LAB — LIPID PANEL
Chol/HDL Ratio: 3.5 ratio (ref 0.0–5.0)
Cholesterol, Total: 163 mg/dL (ref 100–199)
HDL: 47 mg/dL (ref >39)
LDL Chol Calc (NIH): 105 mg/dL — ABNORMAL HIGH (ref 0–99)
Triglycerides: 52 mg/dL (ref 0–149)
VLDL Cholesterol Cal: 11 mg/dL (ref 5–40)

## 2022-06-05 ENCOUNTER — Telehealth: Payer: Self-pay | Admitting: Family Medicine

## 2022-06-05 NOTE — Telephone Encounter (Signed)
Called patient to schedule Medicare Annual Wellness Visit (AWV). Left message for patient to call back and schedule Medicare Annual Wellness Visit (AWV).  Last date of AWV: : 11/01/2020   Please schedule an appointment at any time with either Mickel Baas or [place], NHA's. .  If any questions, please contact me at [phone].  Thank you,  Colletta [place],  [facility] Direct Dial ??[phone]

## 2022-06-27 ENCOUNTER — Telehealth: Payer: Self-pay | Admitting: Family Medicine

## 2022-06-27 NOTE — Telephone Encounter (Signed)
Called patient to schedule Medicare Annual Wellness Visit (AWV). Left message for patient to call back and schedule Medicare Annual Wellness Visit (AWV).  Last date of AWV: : 11/01/2020   Please schedule an appointment at any time with either Laura or Courtney, NHA's. .  If any questions, please contact me at [phone].  Thank you,  Stephanie,  [facility] Clinical Support [facility] AWV Program Direct Dial ??[phone]   

## 2022-06-28 ENCOUNTER — Telehealth: Payer: Self-pay | Admitting: Family Medicine

## 2022-06-28 NOTE — Telephone Encounter (Signed)
Contacted Shawn Lambert to schedule their annual wellness visit. Appointment made for 07/05/2022.  Thank you,  [name],  AMB Clinical Support [facility] AWV Program Direct Dial ??[phone]

## 2022-07-03 ENCOUNTER — Telehealth: Payer: Self-pay

## 2022-07-03 NOTE — Telephone Encounter (Signed)
Continue to hold medication, will forward to PCP.   Jannifer Rodney, FNP

## 2022-07-03 NOTE — Telephone Encounter (Signed)
[facility] patient - FYI, patient reports that since he started Tamsulosin he has been breaking out with an itchy rash.  He started the medication back in February but has continued to take it but thinks the rash is worsening and has stopped the medication.  Wanted to let us know.

## 2022-07-04 NOTE — Telephone Encounter (Signed)
Okay yes, hold medication and will discuss other options when he returns

## 2022-07-05 ENCOUNTER — Ambulatory Visit (INDEPENDENT_AMBULATORY_CARE_PROVIDER_SITE_OTHER): Payer: Medicare Other

## 2022-07-05 VITALS — Ht 68.0 in | Wt 169.0 lb

## 2022-07-05 DIAGNOSIS — Z0000 Encounter for general adult medical examination without abnormal findings: Secondary | ICD-10-CM

## 2022-07-05 NOTE — Progress Notes (Signed)
Subjective:   Shawn Lambert is a 82 y.o. male who presents for Medicare Annual/Subsequent preventive examination. I connected with  Joline Maxcy on 07/05/22 by a audio enabled telemedicine application and verified that I am speaking with the correct person using two identifiers.  Patient Location: Home  Provider Location: Home Office  I discussed the limitations of evaluation and management by telemedicine. The patient expressed understanding and agreed to proceed.  Review of Systems     Cardiac Risk Factors include: advanced age (>38men, >28 women);hypertension;male gender;dyslipidemia;diabetes mellitus     Objective:    Today's Vitals   07/05/22 0926  Weight: 169 lb (76.7 kg)  Height:  (1.727 m)   Body mass index is 25.7 kg/m.     07/05/2022    9:30 AM 11/01/2020    9:07 AM 05/04/2018   10:20 PM 05/04/2018    5:08 PM 10/24/2017   10:40 AM 08/01/2015    1:44 PM 07/26/2015    3:39 PM  Advanced Directives  Does Patient Have a Medical Advance Directive? No No No No No No No  Would patient like information on creating a medical advance directive? No - Patient declined No - Patient declined No - Patient declined  No - Patient declined  No - patient declined information    Current Medications (verified) Outpatient Encounter Medications as of 07/05/2022  Medication Sig   amLODipine (NORVASC) 5 MG tablet Take 1.5 tablets (7.5 mg total) by mouth daily.   aspirin EC 81 MG tablet Take 81 mg by mouth daily.   atorvastatin (LIPITOR) 40 MG tablet Take 1 tablet (40 mg total) by mouth daily.   Beta Carotene (VITAMIN A) 25000 UNIT capsule Take 25,000 Units by mouth daily.   Calcium Carbonate-Vitamin D (CALCIUM-VITAMIN D) 600-200 MG-UNIT CAPS Take 2 tablets by mouth daily. Reported on 08/01/2015   Glucosamine-Chondroitin-MSM 750-400-375 MG TABS Take 1 tablet by mouth daily.    lisinopril (ZESTRIL) 10 MG tablet Take 1 tablet (10 mg total) by mouth daily.   Multiple Vitamins-Minerals  ([employer] SPECTRAVITE ADULT 50+ PO) Take 1 tablet by mouth daily. Reported on 08/01/2015   omeprazole (PRILOSEC) 40 MG capsule Take 1 capsule (40 mg total) by mouth daily.   vitamin B-12 (CYANOCOBALAMIN) 50 MCG tablet Take 50 mcg by mouth daily.   tamsulosin (FLOMAX) 0.4 MG CAPS capsule Take 1 capsule (0.4 mg total) by mouth daily after supper. (Patient not taking: Reported on 07/05/2022)   Facility-Administered Encounter Medications as of 07/05/2022  Medication   Betamethasone Sod Phos & Acet 30 MG/5ML KIT 2 mL    Allergies (verified) Penicillins and Sulfonamide derivatives   History: Past Medical History:  Diagnosis Date   Anginal pain    Arthritis    Cancer    basil cell skin cancer   Dysrhythmia    pt. states physician said " he has a irregular heart beat"   GERD (gastroesophageal reflux disease)    Hyperlipidemia    Hypertension    Kidney stones    Past Surgical History:  Procedure Laterality Date   EYE SURGERY Right 1965   Prosthetic Eye   LEG SURGERY Left 12/18/2016   fractured femur   TOTAL KNEE ARTHROPLASTY     TOTAL KNEE ARTHROPLASTY Right 07/26/2015   Procedure: RIGHT TOTAL KNEE ARTHROPLASTY;  Surgeon: Durene Romans, MD;  Location: [facility];  Service: Orthopedics;  Laterality: Right;   Family History  Problem Relation Age of Onset   Congestive Heart Failure Father  Congestive Heart Failure Mother    AAA (abdominal aortic aneurysm) Mother    COPD Mother    Macular degeneration Mother    Heart attack Brother 44   Arthritis Brother    Multiple sclerosis Son    Social History   Socioeconomic History   Marital status: Widowed    Spouse name: Not on file   Number of children: 2   Years of education: 14   Highest education level: Associate degree: occupational, Scientist, product/process development, or vocational program  Occupational History   Occupation: Retired    Comment: Barrister's clerk  Tobacco Use   Smoking status: Never   Smokeless tobacco: Never  Vaping Use   Vaping Use: Never  used  Substance and Sexual Activity   Alcohol use: No   Drug use: No   Sexual activity: Yes  Other Topics Concern   Not on file  Social History Narrative   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   Social Determinants of Health   Financial Resource Strain: Low Risk  (07/05/2022)   Overall Financial Resource Strain (CARDIA)    Difficulty of Paying Living Expenses: Not hard at all  Food Insecurity: No Food Insecurity (07/05/2022)   Hunger Vital Sign    Worried About Running Out of Food in the Last Year: Never true    Ran Out of Food in the Last Year: Never true  Transportation Needs: No Transportation Needs (07/05/2022)   PRAPARE - Administrator, Civil Service (Medical): No    Lack of Transportation (Non-Medical): No  Physical Activity: Sufficiently Active (07/05/2022)   Exercise Vital Sign    Days of Exercise per Week: 5 days    Minutes of Exercise per Session: 30 min  Stress: No Stress Concern Present (07/05/2022)   [employer] of Occupational Health - Occupational Stress Questionnaire    Feeling of Stress : Not at all  Social Connections: Moderately Isolated (07/05/2022)   Social Connection and Isolation Panel [NHANES]    Frequency of Communication with Friends and Family: More than three times a week    Frequency of Social Gatherings with Friends and Family: More than three times a week    Attends Religious Services: More than 4 times per year    Active Member of [employer] or Organizations: No    Attends Banker Meetings: Never    Marital Status: Widowed    Tobacco Counseling Counseling given: Not Answered   Clinical Intake:  Pre-visit preparation completed: Yes  Pain : No/denies pain     Nutritional Risks: None Diabetes: Yes CBG done?: No Did pt. bring in CBG monitor from home?: No  How often do you need to have someone help you when you  read instructions, pamphlets, or other written materials from your doctor or pharmacy?: 1 - Never  Diabetic?yes  Nutrition Risk Assessment:  Has the patient had any N/V/D within the last 2 months?  No  Does the patient have any non-healing wounds?  No  Has the patient had any unintentional weight loss or weight gain?  No   Diabetes:  Is the patient diabetic?  Yes  If diabetic, was a CBG obtained today?  No  Did the patient bring in their glucometer from home?  No  How often do you monitor your CBG's? Every other day .   Financial Strains and Diabetes Management:  Are  you having any financial strains with the device, your supplies or your medication? No .  Does the patient want to be seen by Chronic Care Management for management of their diabetes?  No  Would the patient like to be referred to a Nutritionist or for Diabetic Management?  No   Diabetic Exams:  Diabetic Eye Exam: Completed 06/2022 Diabetic Foot Exam: Overdue, Pt has been advised about the importance in completing this exam. Pt is scheduled for diabetic foot exam on next office visit .   Interpreter Needed?: No  Information entered by :: Renie Ora, LPN   Activities of Daily Living    07/05/2022    9:30 AM  In your present state of health, do you have any difficulty performing the following activities:  Hearing? 0  Vision? 0  Difficulty concentrating or making decisions? 0  Walking or climbing stairs? 0  Dressing or bathing? 0  Doing errands, shopping? 0  Preparing Food and eating ? N  Using the Toilet? N  In the past six months, have you accidently leaked urine? N  Do you have problems with loss of bowel control? N  Managing your Medications? N  Managing your Finances? N  Housekeeping or managing your Housekeeping? N    Patient Care Team: Dettinger, Elige Radon, MD as PCP - General (Family Medicine) Danella Maiers, [facility] (Pharmacist) Beverely Low, MD as Consulting Physician (Orthopedic  Surgery) Genia Del Daisy Blossom, MD as Consulting Physician (Ophthalmology) Marcelino Duster, MD as Referring Physician (Dermatology)  Indicate any recent Medical Services you may have received from other than [facility] providers in the past year (date may be approximate).     Assessment:   This is a routine wellness examination for Salvator.  Hearing/Vision screen Vision Screening - Comments:: Wears rx glasses - up to date with routine eye exams with  Dr.Johnson   Dietary issues and exercise activities discussed: Current Exercise Habits: Home exercise routine, Type of exercise: walking, Time (Minutes): 30, Frequency (Times/Week): 5, Weekly Exercise (Minutes/Week): 150, Intensity: Mild, Exercise limited by: None identified   Goals Addressed             This Visit's Progress    Exercise 150 min/wk Moderate Activity   On track      Depression Screen    07/05/2022    9:29 AM 05/02/2022    7:58 AM 01/29/2022    8:20 AM 10/26/2021    9:45 AM 07/20/2021    8:23 AM 04/20/2021   10:34 AM 01/26/2021   10:23 AM  PHQ 2/9 Scores  PHQ - 2 Score 0 0 0 0 0 0 0  PHQ- 9 Score   0  0      Fall Risk    07/05/2022    9:28 AM 05/02/2022    7:58 AM 01/29/2022    8:20 AM 10/26/2021    9:45 AM 07/20/2021    8:23 AM  Fall Risk   Falls in the past year? 0 0 0 0 0  Number falls in past yr: 0      Injury with Fall? 0      Risk for fall due to : No Fall Risks      Follow up Falls prevention discussed        FALL RISK PREVENTION PERTAINING TO THE HOME:  Any stairs in or around the home? No  If so, are there any without handrails? No  Home free of loose throw rugs in walkways, pet beds, electrical cords, etc? Yes  Adequate lighting in your home to reduce risk of falls? Yes   ASSISTIVE DEVICES UTILIZED TO PREVENT FALLS:  Life alert? No  Use of a cane, walker or w/c? No  Grab bars in the bathroom? Yes  Shower chair or bench in shower? No  Elevated toilet seat or a handicapped toilet? No        10/24/2017   10:47 AM  MMSE - Mini Mental State Exam  Orientation to time 5  Orientation to Place 5  Registration 3  Attention/ Calculation 5  Recall 1  Language- name 2 objects 2  Language- repeat 1  Language- follow 3 step command 3  Language- read & follow direction 1  Write a sentence 1  Copy design 1  Total score 28        07/05/2022    9:31 AM 11/01/2020    9:07 AM  6CIT Screen  What Year? 0 points 0 points  What month? 0 points 0 points  What time? 0 points 0 points  Count back from 20 0 points 0 points  Months in reverse 0 points 0 points  Repeat phrase 0 points 2 points  Total Score 0 points 2 points    Immunizations Immunization History  Administered Date(s) Administered   Fluad Quad(high Dose 65+) 01/26/2021   Influenza, High Dose Seasonal PF 12/08/2013, 12/30/2015, 12/27/2016, 01/10/2018   Influenza,inj,Quad PF,6+ Mos 12/08/2012, 02/28/2015   Influenza-Unspecified 03/06/2019, 01/19/2020, 12/01/2021   Moderna Sars-Covid-2 Vaccination 04/22/2019, 05/20/2019, 03/17/2020   Pneumococcal Conjugate-13 02/28/2015   Pneumococcal Polysaccharide-23 11/08/2016   Tdap 02/18/2020   Zoster Recombinat (Shingrix) 10/24/2020, 04/20/2021   Zoster, Live 06/25/2013    TDAP status: Up to date  Flu Vaccine status: Up to date  Pneumococcal vaccine status: Up to date  Covid-19 vaccine status: Completed vaccines  Qualifies for Shingles Vaccine? Yes   Zostavax completed Yes   Shingrix Completed?: Yes  Screening Tests Health Maintenance  Topic Date Due   COVID-19 Vaccine (4 - 2023-24 season) 11/17/2021   FOOT EXAM  07/21/2022   OPHTHALMOLOGY EXAM  08/29/2022   INFLUENZA VACCINE  10/18/2022   HEMOGLOBIN A1C  10/31/2022   Diabetic kidney evaluation - Urine ACR  01/30/2023   Diabetic kidney evaluation - eGFR measurement  05/03/2023   Medicare Annual Wellness (AWV)  07/05/2023   DTaP/Tdap/Td (2 - Td or Tdap) 02/17/2030   Pneumonia Vaccine 66+ Years old  Completed    Zoster Vaccines- Shingrix  Completed   HPV VACCINES  Aged Out    Health Maintenance  Health Maintenance Due  Topic Date Due   COVID-19 Vaccine (4 - 2023-24 season) 11/17/2021    Colorectal cancer screening: No longer required.   Lung Cancer Screening: (Low Dose CT Chest recommended if Age 75-80 years, 30 pack-year currently smoking OR have quit w/in 15years.) does not qualify.   Lung Cancer Screening Referral: n/a  Additional Screening:  Hepatitis C Screening: does not qualify;   Vision Screening: Recommended annual ophthalmology exams for early detection of glaucoma and other disorders of the eye. Is the patient up to date with their annual eye exam?  Yes  Who is the provider or what is the name of the office in which the patient attends annual eye exams? Dr.Johnson  If pt is not established with a provider, would they like to be referred to a provider to establish care? No .   Dental Screening: Recommended annual dental exams for proper oral hygiene  Community Resource Referral / Chronic Care Management: CRR  required this visit?  No   CCM required this visit?  No      Plan:     I have personally reviewed and noted the following in the patient's chart:   Medical and social history Use of alcohol, tobacco or illicit drugs  Current medications and supplements including opioid prescriptions. Patient is not currently taking opioid prescriptions. Functional ability and status Nutritional status Physical activity Advanced directives List of other physicians Hospitalizations, surgeries, and ER visits in previous 12 months Vitals Screenings to include cognitive, depression, and falls Referrals and appointments  In addition, I have reviewed and discussed with patient certain preventive protocols, quality metrics, and best practice recommendations. A written personalized care plan for preventive services as well as general preventive health recommendations were provided to  patient.     Lorrene Reid, LPN   1/61/0960   Nurse Notes: none

## 2022-07-05 NOTE — Patient Instructions (Signed)
Shawn Lambert , Thank you for taking time to come for your Medicare Wellness Visit. I appreciate your ongoing commitment to your health goals. Please review the following plan we discussed and let me know if I can assist you in the future.   These are the goals we discussed:  Goals      Exercise 150 min/wk Moderate Activity        This is a list of the screening recommended for you and due dates:  Health Maintenance  Topic Date Due   COVID-19 Vaccine (4 - 2023-24 season) 11/17/2021   Complete foot exam   07/21/2022   Eye exam for diabetics  08/29/2022   Flu Shot  10/18/2022   Hemoglobin A1C  10/31/2022   Yearly kidney health urinalysis for diabetes  01/30/2023   Yearly kidney function blood test for diabetes  05/03/2023   Medicare Annual Wellness Visit  07/05/2023   DTaP/Tdap/Td vaccine (2 - Td or Tdap) 02/17/2030   Pneumonia Vaccine  Completed   Zoster (Shingles) Vaccine  Completed   HPV Vaccine  Aged Out    Advanced directives: Advance directive discussed with you today. I have provided a copy for you to complete at home and have notarized. Once this is complete please bring a copy in to our office so we can scan it into your chart.   Conditions/risks identified: Aim for 30 minutes of exercise or brisk walking, 6-8 glasses of water, and 5 servings of fruits and vegetables each day.   Next appointment: Follow up in one year for your annual wellness visit.   Preventive Care 82 Years and Older, Male  Preventive care refers to lifestyle choices and visits with your health care provider that can promote health and wellness. What does preventive care include? A yearly physical exam. This is also called an annual well check. Dental exams once or twice a year. Routine eye exams. Ask your health care provider how often you should have your eyes checked. Personal lifestyle choices, including: Daily care of your teeth and gums. Regular physical activity. Eating a healthy  diet. Avoiding tobacco and drug use. Limiting alcohol use. Practicing safe sex. Taking low doses of aspirin every day. Taking vitamin and mineral supplements as recommended by your health care provider. What happens during an annual well check? The services and screenings done by your health care provider during your annual well check will depend on your age, overall health, lifestyle risk factors, and family history of disease. Counseling  Your health care provider may ask you questions about your: Alcohol use. Tobacco use. Drug use. Emotional well-being. Home and relationship well-being. Sexual activity. Eating habits. History of falls. Memory and ability to understand (cognition). Work and work Astronomer. Screening  You may have the following tests or measurements: Height, weight, and BMI. Blood pressure. Lipid and cholesterol levels. These may be checked every 5 years, or more frequently if you are over 80 years old. Skin check. Lung cancer screening. You may have this screening every year starting at age 67 if you have a 30-pack-year history of smoking and currently smoke or have quit within the past 15 years. Fecal occult blood test (FOBT) of the stool. You may have this test every year starting at age 71. Flexible sigmoidoscopy or colonoscopy. You may have a sigmoidoscopy every 5 years or a colonoscopy every 10 years starting at age 64. Prostate cancer screening. Recommendations will vary depending on your family history and other risks. Hepatitis C blood test. Hepatitis B  blood test. Sexually transmitted disease (STD) testing. Diabetes screening. This is done by checking your blood sugar (glucose) after you have not eaten for a while (fasting). You may have this done every 1-3 years. Abdominal aortic aneurysm (AAA) screening. You may need this if you are a current or former smoker. Osteoporosis. You may be screened starting at age 46 if you are at high risk. Talk with  your health care provider about your test results, treatment options, and if necessary, the need for more tests. Vaccines  Your health care provider may recommend certain vaccines, such as: Influenza vaccine. This is recommended every year. Tetanus, diphtheria, and acellular pertussis (Tdap, Td) vaccine. You may need a Td booster every 10 years. Zoster vaccine. You may need this after age 5. Pneumococcal 13-valent conjugate (PCV13) vaccine. One dose is recommended after age 9. Pneumococcal polysaccharide (PPSV23) vaccine. One dose is recommended after age 63. Talk to your health care provider about which screenings and vaccines you need and how often you need them. This information is not intended to replace advice given to you by your health care provider. Make sure you discuss any questions you have with your health care provider. Document Released: 04/01/2015 Document Revised: 11/23/2015 Document Reviewed: 01/04/2015 Elsevier Interactive Patient Education  2017 [employer] [employer] Prevention in the Home Falls can cause injuries. They can happen to people of all ages. There are many things you can do to make your home safe and to help prevent falls. What can I do on the outside of my home? Regularly fix the edges of walkways and driveways and fix any cracks. Remove anything that might make you trip as you walk through a door, such as a raised step or threshold. Trim any bushes or trees on the path to your home. Use bright outdoor lighting. Clear any walking paths of anything that might make someone trip, such as rocks or tools. Regularly check to see if handrails are loose or broken. Make sure that both sides of any steps have handrails. Any raised decks and porches should have guardrails on the edges. Have any leaves, snow, or ice cleared regularly. Use sand or salt on walking paths during winter. Clean up any spills in your garage right away. This includes oil or grease spills. What  can I do in the bathroom? Use night lights. Install grab bars by the toilet and in the tub and shower. Do not use towel bars as grab bars. Use non-skid mats or decals in the tub or shower. If you need to sit down in the shower, use a plastic, non-slip stool. Keep the floor dry. Clean up any water that spills on the floor as soon as it happens. Remove soap buildup in the tub or shower regularly. Attach bath mats securely with double-sided non-slip rug tape. Do not have throw rugs and other things on the floor that can make you trip. What can I do in the bedroom? Use night lights. Make sure that you have a light by your bed that is easy to reach. Do not use any sheets or blankets that are too big for your bed. They should not hang down onto the floor. Have a firm chair that has side arms. You can use this for support while you get dressed. Do not have throw rugs and other things on the floor that can make you trip. What can I do in the kitchen? Clean up any spills right away. Avoid walking on wet floors. Keep items  that you use a lot in easy-to-reach places. If you need to reach something above you, use a strong step stool that has a grab bar. Keep electrical cords out of the way. Do not use floor polish or wax that makes floors slippery. If you must use wax, use non-skid floor wax. Do not have throw rugs and other things on the floor that can make you trip. What can I do with my stairs? Do not leave any items on the stairs. Make sure that there are handrails on both sides of the stairs and use them. Fix handrails that are broken or loose. Make sure that handrails are as long as the stairways. Check any carpeting to make sure that it is firmly attached to the stairs. Fix any carpet that is loose or worn. Avoid having throw rugs at the top or bottom of the stairs. If you do have throw rugs, attach them to the floor with carpet tape. Make sure that you have a light switch at the top of the  stairs and the bottom of the stairs. If you do not have them, ask someone to add them for you. What else can I do to help prevent falls? Wear shoes that: Do not have high heels. Have rubber bottoms. Are comfortable and fit you well. Are closed at the toe. Do not wear sandals. If you use a stepladder: Make sure that it is fully opened. Do not climb a closed stepladder. Make sure that both sides of the stepladder are locked into place. Ask someone to hold it for you, if possible. Clearly mark and make sure that you can see: Any grab bars or handrails. First and last steps. Where the edge of each step is. Use tools that help you move around (mobility aids) if they are needed. These include: Canes. Walkers. Scooters. Crutches. Turn on the lights when you go into a dark area. Replace any light bulbs as soon as they burn out. Set up your furniture so you have a clear path. Avoid moving your furniture around. If any of your floors are uneven, fix them. If there are any pets around you, be aware of where they are. Review your medicines with your doctor. Some medicines can make you feel dizzy. This can increase your chance of falling. Ask your doctor what other things that you can do to help prevent falls. This information is not intended to replace advice given to you by your health care provider. Make sure you discuss any questions you have with your health care provider. Document Released: 12/30/2008 Document Revised: 08/11/2015 Document Reviewed: 04/09/2014 Elsevier Interactive Patient Education  2017 [employer].

## 2022-07-30 DIAGNOSIS — L570 Actinic keratosis: Secondary | ICD-10-CM | POA: Diagnosis not present

## 2022-07-30 DIAGNOSIS — Z85828 Personal history of other malignant neoplasm of skin: Secondary | ICD-10-CM | POA: Diagnosis not present

## 2022-07-30 DIAGNOSIS — Z1283 Encounter for screening for malignant neoplasm of skin: Secondary | ICD-10-CM | POA: Diagnosis not present

## 2022-07-30 DIAGNOSIS — D485 Neoplasm of uncertain behavior of skin: Secondary | ICD-10-CM | POA: Diagnosis not present

## 2022-08-15 ENCOUNTER — Ambulatory Visit (INDEPENDENT_AMBULATORY_CARE_PROVIDER_SITE_OTHER): Payer: Medicare Other | Admitting: Family Medicine

## 2022-08-15 ENCOUNTER — Encounter: Payer: Self-pay | Admitting: Family Medicine

## 2022-08-15 VITALS — BP 130/65 | HR 67 | Ht 68.0 in | Wt 175.0 lb

## 2022-08-15 DIAGNOSIS — E1159 Type 2 diabetes mellitus with other circulatory complications: Secondary | ICD-10-CM

## 2022-08-15 DIAGNOSIS — E119 Type 2 diabetes mellitus without complications: Secondary | ICD-10-CM | POA: Diagnosis not present

## 2022-08-15 DIAGNOSIS — N401 Enlarged prostate with lower urinary tract symptoms: Secondary | ICD-10-CM

## 2022-08-15 DIAGNOSIS — E1169 Type 2 diabetes mellitus with other specified complication: Secondary | ICD-10-CM

## 2022-08-15 DIAGNOSIS — I152 Hypertension secondary to endocrine disorders: Secondary | ICD-10-CM | POA: Diagnosis not present

## 2022-08-15 DIAGNOSIS — E785 Hyperlipidemia, unspecified: Secondary | ICD-10-CM | POA: Diagnosis not present

## 2022-08-15 DIAGNOSIS — R350 Frequency of micturition: Secondary | ICD-10-CM

## 2022-08-15 DIAGNOSIS — I10 Essential (primary) hypertension: Secondary | ICD-10-CM

## 2022-08-15 LAB — BAYER DCA HB A1C WAIVED: HB A1C (BAYER DCA - WAIVED): 7 % — ABNORMAL HIGH (ref 4.8–5.6)

## 2022-08-15 LAB — PSA, TOTAL AND FREE

## 2022-08-15 MED ORDER — FINASTERIDE 5 MG PO TABS
5.0000 mg | ORAL_TABLET | Freq: Every day | ORAL | 1 refills | Status: DC
Start: 2022-08-15 — End: 2022-11-15

## 2022-08-15 NOTE — Addendum Note (Signed)
Addended by: Arville Care on: 08/15/2022 10:29 AM   Modules accepted: Orders

## 2022-08-15 NOTE — Progress Notes (Signed)
BP 130/65   Pulse 67   Ht 5\' 8"  (1.727 m)   Wt 175 lb (79.4 kg)   SpO2 97%   BMI 26.61 kg/m    Subjective:   Patient ID: Shawn Lambert, male    DOB: 04-Sep-1940, 82 y.o.   MRN: [id number]  HPI: Shawn Lambert is a 82 y.o. male presenting on 08/15/2022 for Medical Management of Chronic Issues and Diabetes   HPI Type 2 diabetes mellitus Patient comes in today for recheck of his diabetes. Patient has been currently taking no medicine currently, diet control. Patient is currently on an ACE inhibitor/ARB. Patient has not seen an ophthalmologist this year. Patient denies any new issues with their feet. The symptom started onset as an adult hypertension and hyperlipidemia ARE RELATED TO DM   Hypertension Patient is currently on amlodipine and lisinopril, and their blood pressure today is 130/65. Patient denies any lightheadedness or dizziness. Patient denies headaches, blurred vision, chest pains, shortness of breath, or weakness. Denies any side effects from medication and is content with current medication.   Hyperlipidemia Patient is coming in for recheck of his hyperlipidemia. The patient is currently taking atorvastatin. They deny any issues with myalgias or history of liver damage from it. They deny any focal numbness or weakness or chest pain.   BPH Patient is coming in for recheck on BPH Symptoms: Urinary frequency and urgency Medication: Tried tamsulosin but had a lot of itching with it, will try finasteride Last PSA: June 2021, 1.3  Relevant past medical, surgical, family and social history reviewed and updated as indicated. Interim medical history since our last visit reviewed. Allergies and medications reviewed and updated.  Review of Systems  Constitutional:  Negative for chills and fever.  Eyes:  Negative for visual disturbance.  Respiratory:  Negative for shortness of breath and wheezing.   Cardiovascular:  Negative for chest pain and leg swelling.  Musculoskeletal:   Negative for back pain and gait problem.  Skin:  Negative for rash.  Neurological:  Negative for dizziness and light-headedness.  All other systems reviewed and are negative.   Per HPI unless specifically indicated above   Allergies as of 08/15/2022       Reactions   Penicillins Itching   Has patient had a PCN reaction causing immediate rash, facial/tongue/throat swelling, SOB or lightheadedness with hypotension: no Has patient had a PCN reaction causing severe rash involving mucus membranes or skin necrosis: no Has patient had a PCN reaction that required hospitalization no Has patient had a PCN reaction occurring within the last 10 years: no If all of the above answers are "NO", then may proceed with Cephalosporin use.   Sulfonamide Derivatives Other (See Comments)   Reaction not known        Medication List        Accurate as of Aug 15, 2022 10:22 AM. If you have any questions, ask your nurse or doctor.          STOP taking these medications    Calcium-Vitamin D 600-200 MG-UNIT Caps Stopped by: Elige Radon Anetra Czerwinski, MD   tamsulosin 0.4 MG Caps capsule Commonly known as: FLOMAX Stopped by: Elige Radon Preslee Regas, MD   vitamin B-12 50 MCG tablet Commonly known as: CYANOCOBALAMIN Stopped by: Elige Radon Shermeka Rutt, MD       TAKE these medications    amLODipine 5 MG tablet Commonly known as: NORVASC Take 1.5 tablets (7.5 mg total) by mouth daily.   aspirin EC 81 MG  tablet Take 81 mg by mouth daily.   atorvastatin 40 MG tablet Commonly known as: LIPITOR Take 1 tablet (40 mg total) by mouth daily.   [employer]S SPECTRAVITE ADULT 50+ PO Take 1 tablet by mouth daily. Reported on 08/01/2015   Glucosamine-Chondroitin-MSM 750-400-375 MG Tabs Take 1 tablet by mouth daily.   lisinopril 10 MG tablet Commonly known as: ZESTRIL Take 1 tablet (10 mg total) by mouth daily.   omeprazole 40 MG capsule Commonly known as: PRILOSEC Take 1 capsule (40 mg total) by mouth daily.    vitamin A 54627 UNIT capsule Take 25,000 Units by mouth daily.         Objective:   BP 130/65   Pulse 67   Ht 5\' 8"  (1.727 m)   Wt 175 lb (79.4 kg)   SpO2 97%   BMI 26.61 kg/m   Wt Readings from Last 3 Encounters:  08/15/22 175 lb (79.4 kg)  07/05/22 169 lb (76.7 kg)  05/02/22 171 lb (77.6 kg)    Physical Exam Vitals and nursing note reviewed.  Constitutional:      General: He is not in acute distress.    Appearance: He is well-developed. He is not diaphoretic.  Eyes:     General: No scleral icterus.    Conjunctiva/sclera: Conjunctivae normal.  Neck:     Thyroid: No thyromegaly.  Cardiovascular:     Rate and Rhythm: Normal rate and regular rhythm.     Heart sounds: Normal heart sounds. No murmur heard. Pulmonary:     Effort: Pulmonary effort is normal. No respiratory distress.     Breath sounds: Normal breath sounds. No wheezing.  Musculoskeletal:        General: Normal range of motion.     Cervical back: Neck supple.  Lymphadenopathy:     Cervical: No cervical adenopathy.  Skin:    General: Skin is warm and dry.     Findings: No rash.  Neurological:     Mental Status: He is alert and oriented to person, place, and time.     Coordination: Coordination normal.  Psychiatric:        Behavior: Behavior normal.       Assessment & Plan:   Problem List Items Addressed This Visit       Cardiovascular and Mediastinum   Essential hypertension     Endocrine   T2DM (type 2 diabetes mellitus) (HCC) - Primary   Relevant Orders   Bayer DCA Hb A1c Waived   BMP8+EGFR     Other   Hyperlipidemia LDL goal <130   Benign prostatic hyperplasia with urinary frequency   Relevant Orders   PSA, total and free  Patient's A1c is 7.00, continue with diet.  Blood pressure looks good.  No changes today.  Seems to be doing well  Follow up plan: Return in about 3 months (around 11/15/2022), or if symptoms worsen or fail to improve, for Diabetes recheck.  Counseling  provided for all of the vaccine components Orders Placed This Encounter  Procedures   Bayer DCA Hb A1c Waived   BMP8+EGFR   PSA, total and free    Arville Care, MD [facility] 08/15/2022, 10:22 AM

## 2022-08-16 LAB — BMP8+EGFR
BUN/Creatinine Ratio: 21 (ref 10–24)
BUN: 25 mg/dL (ref 8–27)
CO2: 19 mmol/L — ABNORMAL LOW (ref 20–29)
Calcium: 8.7 mg/dL (ref 8.6–10.2)
Chloride: 108 mmol/L — ABNORMAL HIGH (ref 96–106)
Creatinine, Ser: 1.19 mg/dL (ref 0.76–1.27)
Glucose: 185 mg/dL — ABNORMAL HIGH (ref 70–99)
Potassium: 4.2 mmol/L (ref 3.5–5.2)
Sodium: 141 mmol/L (ref 134–144)
eGFR: 61 mL/min/{1.73_m2} (ref >59)

## 2022-08-16 LAB — PSA, TOTAL AND FREE
PSA, Free Pct: 25.9 %
Prostate Specific Ag, Serum: 1.7 ng/mL (ref 0.0–4.0)

## 2022-09-24 ENCOUNTER — Encounter (HOSPITAL_COMMUNITY): Payer: Self-pay

## 2022-09-24 ENCOUNTER — Emergency Department (HOSPITAL_COMMUNITY): Payer: Medicare Other

## 2022-09-24 ENCOUNTER — Telehealth: Payer: Self-pay | Admitting: Internal Medicine

## 2022-09-24 ENCOUNTER — Encounter: Payer: Self-pay | Admitting: Family Medicine

## 2022-09-24 ENCOUNTER — Ambulatory Visit (INDEPENDENT_AMBULATORY_CARE_PROVIDER_SITE_OTHER): Payer: Medicare Other | Admitting: Family Medicine

## 2022-09-24 ENCOUNTER — Other Ambulatory Visit: Payer: Self-pay

## 2022-09-24 ENCOUNTER — Inpatient Hospital Stay (HOSPITAL_COMMUNITY): Payer: Medicare Other

## 2022-09-24 ENCOUNTER — Inpatient Hospital Stay (HOSPITAL_COMMUNITY)
Admission: EM | Admit: 2022-09-24 | Discharge: 2022-09-26 | DRG: 244 | Disposition: A | Discharge status: Home / Self Care | Payer: Medicare Other | Source: Ambulatory Visit | Attending: Cardiovascular Disease | Admitting: Cardiovascular Disease

## 2022-09-24 ENCOUNTER — Encounter (HOSPITAL_COMMUNITY)
Admission: EM | Disposition: A | Discharge status: Home / Self Care | Payer: Self-pay | Source: Ambulatory Visit | Attending: Cardiovascular Disease

## 2022-09-24 VITALS — BP 132/60 | HR 31 | Ht 68.0 in | Wt 176.0 lb

## 2022-09-24 DIAGNOSIS — Z7901 Long term (current) use of anticoagulants: Secondary | ICD-10-CM

## 2022-09-24 DIAGNOSIS — Z8261 Family history of arthritis: Secondary | ICD-10-CM | POA: Diagnosis not present

## 2022-09-24 DIAGNOSIS — R0989 Other specified symptoms and signs involving the circulatory and respiratory systems: Secondary | ICD-10-CM | POA: Diagnosis not present

## 2022-09-24 DIAGNOSIS — Z8249 Family history of ischemic heart disease and other diseases of the circulatory system: Secondary | ICD-10-CM

## 2022-09-24 DIAGNOSIS — N400 Enlarged prostate without lower urinary tract symptoms: Secondary | ICD-10-CM | POA: Diagnosis present

## 2022-09-24 DIAGNOSIS — Z882 Allergy status to sulfonamides status: Secondary | ICD-10-CM

## 2022-09-24 DIAGNOSIS — R5383 Other fatigue: Secondary | ICD-10-CM | POA: Diagnosis not present

## 2022-09-24 DIAGNOSIS — K219 Gastro-esophageal reflux disease without esophagitis: Secondary | ICD-10-CM | POA: Diagnosis present

## 2022-09-24 DIAGNOSIS — Z96651 Presence of right artificial knee joint: Secondary | ICD-10-CM | POA: Diagnosis present

## 2022-09-24 DIAGNOSIS — R531 Weakness: Secondary | ICD-10-CM | POA: Diagnosis not present

## 2022-09-24 DIAGNOSIS — R001 Bradycardia, unspecified: Secondary | ICD-10-CM

## 2022-09-24 DIAGNOSIS — Z79899 Other long term (current) drug therapy: Secondary | ICD-10-CM | POA: Diagnosis not present

## 2022-09-24 DIAGNOSIS — Z87442 Personal history of urinary calculi: Secondary | ICD-10-CM

## 2022-09-24 DIAGNOSIS — Z86718 Personal history of other venous thrombosis and embolism: Secondary | ICD-10-CM | POA: Diagnosis not present

## 2022-09-24 DIAGNOSIS — Z950 Presence of cardiac pacemaker: Secondary | ICD-10-CM | POA: Diagnosis not present

## 2022-09-24 DIAGNOSIS — Z820 Family history of epilepsy and other diseases of the nervous system: Secondary | ICD-10-CM | POA: Diagnosis not present

## 2022-09-24 DIAGNOSIS — I10 Essential (primary) hypertension: Secondary | ICD-10-CM | POA: Diagnosis not present

## 2022-09-24 DIAGNOSIS — E785 Hyperlipidemia, unspecified: Secondary | ICD-10-CM | POA: Diagnosis present

## 2022-09-24 DIAGNOSIS — Z7982 Long term (current) use of aspirin: Secondary | ICD-10-CM

## 2022-09-24 DIAGNOSIS — Z85828 Personal history of other malignant neoplasm of skin: Secondary | ICD-10-CM | POA: Diagnosis not present

## 2022-09-24 DIAGNOSIS — Z970 Presence of artificial eye: Secondary | ICD-10-CM | POA: Diagnosis not present

## 2022-09-24 DIAGNOSIS — E119 Type 2 diabetes mellitus without complications: Secondary | ICD-10-CM | POA: Diagnosis present

## 2022-09-24 DIAGNOSIS — I959 Hypotension, unspecified: Secondary | ICD-10-CM | POA: Diagnosis not present

## 2022-09-24 DIAGNOSIS — Z825 Family history of asthma and other chronic lower respiratory diseases: Secondary | ICD-10-CM | POA: Diagnosis not present

## 2022-09-24 DIAGNOSIS — Z880 Allergy status to penicillin: Secondary | ICD-10-CM | POA: Diagnosis not present

## 2022-09-24 DIAGNOSIS — I442 Atrioventricular block, complete: Secondary | ICD-10-CM

## 2022-09-24 DIAGNOSIS — Z452 Encounter for adjustment and management of vascular access device: Secondary | ICD-10-CM | POA: Diagnosis not present

## 2022-09-24 HISTORY — PX: ARTERIAL LINE INSERTION: CATH118227

## 2022-09-24 HISTORY — PX: TEMPORARY PACEMAKER: CATH118268

## 2022-09-24 LAB — COMPREHENSIVE METABOLIC PANEL
ALT: 26 U/L (ref 0–44)
ALT: 40 U/L (ref 0–44)
AST: 13 U/L — ABNORMAL LOW (ref 15–41)
AST: 19 U/L (ref 15–41)
Albumin: 1.8 g/dL — ABNORMAL LOW (ref 3.5–5.0)
Albumin: 3 g/dL — ABNORMAL LOW (ref 3.5–5.0)
Alkaline Phosphatase: 33 U/L — ABNORMAL LOW (ref 38–126)
Alkaline Phosphatase: 56 U/L (ref 38–126)
Anion gap: 6 (ref 5–15)
Anion gap: 11 (ref 5–15)
BUN: 19 mg/dL (ref 8–23)
BUN: 28 mg/dL — ABNORMAL HIGH (ref 8–23)
CO2: 13 mmol/L — ABNORMAL LOW (ref 22–32)
CO2: 18 mmol/L — ABNORMAL LOW (ref 22–32)
Calcium: 5.2 mg/dL — CL (ref 8.9–10.3)
Calcium: 8.8 mg/dL — ABNORMAL LOW (ref 8.9–10.3)
Chloride: 125 mmol/L — ABNORMAL HIGH (ref 98–111)
Chloride: 111 mmol/L (ref 98–111)
Creatinine, Ser: 0.8 mg/dL (ref 0.61–1.24)
Creatinine, Ser: 1.24 mg/dL (ref 0.61–1.24)
GFR, Estimated: >60 mL/min (ref >60)
GFR, Estimated: 58 mL/min — ABNORMAL LOW (ref >60)
Glucose, Bld: 81 mg/dL (ref 70–99)
Glucose, Bld: 107 mg/dL — ABNORMAL HIGH (ref 70–99)
Potassium: 2.4 mmol/L — CL (ref 3.5–5.1)
Potassium: 3.9 mmol/L (ref 3.5–5.1)
Sodium: 144 mmol/L (ref 135–145)
Sodium: 140 mmol/L (ref 135–145)
Total Bilirubin: 0.3 mg/dL (ref 0.3–1.2)
Total Bilirubin: 0.7 mg/dL (ref 0.3–1.2)
Total Protein: 3.2 g/dL — ABNORMAL LOW (ref 6.5–8.1)
Total Protein: 5.3 g/dL — ABNORMAL LOW (ref 6.5–8.1)

## 2022-09-24 LAB — TROPONIN I (HIGH SENSITIVITY)
Troponin I (High Sensitivity): 9 ng/L (ref <18)
Troponin I (High Sensitivity): 12 ng/L (ref <18)

## 2022-09-24 LAB — CBC WITH DIFFERENTIAL/PLATELET
Abs Immature Granulocytes: 0.02 10*3/uL (ref 0.00–0.07)
Basophils Absolute: 0 10*3/uL (ref 0.0–0.1)
Basophils Relative: 0 %
Eosinophils Absolute: 0.1 10*3/uL (ref 0.0–0.5)
Eosinophils Relative: 3 %
HCT: 34.9 % — ABNORMAL LOW (ref 39.0–52.0)
Hemoglobin: 11.2 g/dL — ABNORMAL LOW (ref 13.0–17.0)
Immature Granulocytes: 1 %
Lymphocytes Relative: 20 %
Lymphs Abs: 0.9 10*3/uL (ref 0.7–4.0)
MCH: 30.2 pg (ref 26.0–34.0)
MCHC: 32.1 g/dL (ref 30.0–36.0)
MCV: 94.1 fL (ref 80.0–100.0)
Monocytes Absolute: 0.4 10*3/uL (ref 0.1–1.0)
Monocytes Relative: 9 %
Neutro Abs: 3 10*3/uL (ref 1.7–7.7)
Neutrophils Relative %: 67 %
Platelets: 156 10*3/uL (ref 150–400)
RBC: 3.71 MIL/uL — ABNORMAL LOW (ref 4.22–5.81)
RDW: 13.2 % (ref 11.5–15.5)
WBC: 4.4 10*3/uL (ref 4.0–10.5)
nRBC: 0 % (ref 0.0–0.2)

## 2022-09-24 LAB — GLUCOSE, CAPILLARY
Glucose-Capillary: 96 mg/dL (ref 70–99)
Glucose-Capillary: 144 mg/dL — ABNORMAL HIGH (ref 70–99)

## 2022-09-24 LAB — PROTIME-INR
INR: 1.1 (ref 0.8–1.2)
Prothrombin Time: 14.5 seconds (ref 11.4–15.2)

## 2022-09-24 LAB — MRSA NEXT GEN BY PCR, NASAL: MRSA by PCR Next Gen: NOT DETECTED

## 2022-09-24 LAB — MAGNESIUM
Magnesium: 0.9 mg/dL — CL (ref 1.7–2.4)
Magnesium: 1.6 mg/dL — ABNORMAL LOW (ref 1.7–2.4)

## 2022-09-24 SURGERY — TEMPORARY PACEMAKER
Anesthesia: LOCAL | Laterality: Right

## 2022-09-24 MED ORDER — MIDAZOLAM HCL 2 MG/2ML IJ SOLN
INTRAMUSCULAR | Status: AC
  Filled 2022-09-24: qty 2

## 2022-09-24 MED ORDER — ATORVASTATIN CALCIUM 40 MG PO TABS
40.0000 mg | ORAL_TABLET | Freq: Every day | ORAL | Status: DC
  Administered 2022-09-25 – 2022-09-26 (×2): 40 mg via ORAL
  Filled 2022-09-24 (×2): qty 1

## 2022-09-24 MED ORDER — PANTOPRAZOLE SODIUM 40 MG PO TBEC
40.0000 mg | DELAYED_RELEASE_TABLET | Freq: Every day | ORAL | Status: DC
  Administered 2022-09-25 – 2022-09-26 (×2): 40 mg via ORAL
  Filled 2022-09-24 (×2): qty 1

## 2022-09-24 MED ORDER — CHLORHEXIDINE GLUCONATE CLOTH 2 % EX PADS
6.0000 | MEDICATED_PAD | Freq: Every day | CUTANEOUS | Status: DC
  Administered 2022-09-24 – 2022-09-26 (×3): 6 via TOPICAL

## 2022-09-24 MED ORDER — SODIUM CHLORIDE 0.9 % IV SOLN: INTRAVENOUS | Status: DC | PRN

## 2022-09-24 MED ORDER — FENTANYL CITRATE (PF) 100 MCG/2ML IJ SOLN
INTRAMUSCULAR | Status: AC
  Filled 2022-09-24: qty 2

## 2022-09-24 MED ORDER — LIDOCAINE HCL (PF) 1 % IJ SOLN
INTRAMUSCULAR | Status: DC | PRN
  Administered 2022-09-24 (×2): 2 mL via INTRADERMAL

## 2022-09-24 MED ORDER — LIDOCAINE HCL (PF) 1 % IJ SOLN
INTRAMUSCULAR | Status: AC
  Filled 2022-09-24: qty 30

## 2022-09-24 MED ORDER — AMLODIPINE BESYLATE 5 MG PO TABS
5.0000 mg | ORAL_TABLET | Freq: Every day | ORAL | Status: DC
  Administered 2022-09-24 – 2022-09-26 (×3): 5 mg via ORAL
  Filled 2022-09-24 (×3): qty 1

## 2022-09-24 MED ORDER — MAGNESIUM SULFATE 2 GM/50ML IV SOLN
2.0000 g | Freq: Once | INTRAVENOUS | Status: AC
  Administered 2022-09-24: 2 g via INTRAVENOUS
  Filled 2022-09-24: qty 50

## 2022-09-24 MED ORDER — POTASSIUM CHLORIDE CRYS ER 20 MEQ PO TBCR
20.0000 meq | EXTENDED_RELEASE_TABLET | Freq: Once | ORAL | Status: AC
  Administered 2022-09-24: 20 meq via ORAL
  Filled 2022-09-24: qty 1

## 2022-09-24 MED ORDER — FINASTERIDE 5 MG PO TABS
5.0000 mg | ORAL_TABLET | Freq: Every day | ORAL | Status: DC
  Administered 2022-09-25 – 2022-09-26 (×2): 5 mg via ORAL
  Filled 2022-09-24 (×3): qty 1

## 2022-09-24 MED ORDER — HYDRALAZINE HCL 20 MG/ML IJ SOLN
10.0000 mg | Freq: Once | INTRAMUSCULAR | Status: AC
  Administered 2022-09-24: 10 mg via INTRAVENOUS
  Filled 2022-09-24: qty 1

## 2022-09-24 MED ORDER — ORAL CARE MOUTH RINSE: 15.0000 mL | OROMUCOSAL | Status: DC | PRN

## 2022-09-24 MED ORDER — MIDAZOLAM HCL 2 MG/2ML IJ SOLN
INTRAMUSCULAR | Status: DC | PRN
  Administered 2022-09-24: 1 mg via INTRAVENOUS

## 2022-09-24 MED ORDER — INSULIN ASPART 100 UNIT/ML IJ SOLN
0.0000 [IU] | Freq: Three times a day (TID) | INTRAMUSCULAR | Status: DC
  Administered 2022-09-26: 1 [IU] via SUBCUTANEOUS

## 2022-09-24 MED ORDER — LISINOPRIL 10 MG PO TABS
10.0000 mg | ORAL_TABLET | Freq: Every day | ORAL | Status: DC
  Administered 2022-09-25 – 2022-09-26 (×2): 10 mg via ORAL
  Filled 2022-09-24 (×2): qty 1

## 2022-09-24 MED ORDER — FENTANYL CITRATE (PF) 100 MCG/2ML IJ SOLN
INTRAMUSCULAR | Status: DC | PRN
  Administered 2022-09-24: 25 ug via INTRAVENOUS

## 2022-09-24 SURGICAL SUPPLY — 9 items
CABLE ADAPT PACING TEMP 12FT (ADAPTER) IMPLANT
GLIDESHEATH SLEND A-KIT 6F 22G (SHEATH) IMPLANT
GLIDESHEATH SLEND SS 6F .021 (SHEATH) IMPLANT
KIT MICROPUNCTURE NIT STIFF (SHEATH) IMPLANT
SHEATH PINNACLE 6F 10CM (SHEATH) IMPLANT
SHEATH PROBE COVER 6X72 (BAG) IMPLANT
WIRE MICRO SET SILHO 5FR 7 (SHEATH) IMPLANT
WIRE MICROINTRODUCER 60CM (WIRE) IMPLANT
WIRE PACING TEMP ST TIP 5 (CATHETERS) IMPLANT

## 2022-09-24 NOTE — Telephone Encounter (Signed)
New Message:     Please have Dr Jenene Slicker call Dr Dettinger asap about this patient. The patient is  or was in the office.

## 2022-09-24 NOTE — H&P (Addendum)
ELECTROPHYSIOLOGY CONSULT NOTE    Patient ID: Shawn Lambert MRN: [id number], DOB/AGE: [birth year] 82 y.o.  Admit date: 09/24/2022 Date of Consult: 09/24/2022  Primary Physician: Dettinger, Elige Radon, MD Primary Cardiologist: None  Electrophysiologist: New    Reason for admission: Complete Heart Block Requesting provider: Dr. Karene Fry  Patient Profile: Shawn Lambert is a 82 y.o. male with a history of DM2, HTN, HLD, h/o DVT on coumadin, and BPH who is being seen today for the evaluation of CHB at the request of Dr. Karene Fry.  HPI:  Shawn Lambert is a 82 y.o. male with medical history as above.   Pt was seen in his PCP office today with >1 week of extreme fatigue. Prior to that he was doing ADLs and [place] issue. Now he feels tired just walking to his car or from room to room.  Pulse was found to be 31 and EKG significant for CHB.   Currently, pt is feeling OK at rest. He has had some slight peripheral edema but no different than usual, notices when he doesn't wear socks. He denies syncope, chest pain, palpitations, dyspnea, PND, orthopnea, nausea, vomiting, dizziness, edema, weight gain, or early satiety.   Labs       PLT  156 (07/08 1209) HGB  11.2* (07/08 1209) WBC 4.4 (07/08 1209)  .   INR 1.1  Past Medical History:  Diagnosis Date   Anginal pain (HCC)    Arthritis    Cancer (HCC)    basil cell skin cancer   Dysrhythmia    pt. states physician said " he has a irregular heart beat"   GERD (gastroesophageal reflux disease)    Hyperlipidemia    Hypertension    Kidney stones      Surgical History:  Past Surgical History:  Procedure Laterality Date   EYE SURGERY Right 1965   Prosthetic Eye   LEG SURGERY Left 12/18/2016   fractured femur   TOTAL KNEE ARTHROPLASTY     TOTAL KNEE ARTHROPLASTY Right 07/26/2015   Procedure: RIGHT TOTAL KNEE ARTHROPLASTY;  Surgeon: Durene Romans, MD;  Location: [facility];  Service: Orthopedics;  Laterality: Right;      (Not in a hospital admission)   Inpatient Medications:   Betamethasone Sod Phos & Acet  2 mL Intramuscular Once    Allergies:  Allergies  Allergen Reactions   Penicillins Itching    Has patient had a PCN reaction causing immediate rash, facial/tongue/throat swelling, SOB or lightheadedness with hypotension: no Has patient had a PCN reaction causing severe rash involving mucus membranes or skin necrosis: no Has patient had a PCN reaction that required hospitalization no Has patient had a PCN reaction occurring within the last 10 years: no If all of the above answers are "NO", then may proceed with Cephalosporin use.    Sulfonamide Derivatives Other (See Comments)    Reaction not known    Family History  Problem Relation Age of Onset   Congestive Heart Failure Father    Congestive Heart Failure Mother    AAA (abdominal aortic aneurysm) Mother    COPD Mother    Macular degeneration Mother    Heart attack Brother 51   Arthritis Brother    Multiple sclerosis Son      Physical Exam: Vitals:   09/24/22 1205 09/24/22 1215 09/24/22 1220 09/24/22 1245  BP:      Pulse: (!) 29 (!) 27 (!) 29 (!) 38  Resp: 13 17 16  13  Temp:      TempSrc:      SpO2: 98% 98% 100% 100%  Weight:      Height:        GEN- NAD, A&O x 3, normal affect HEENT: Normocephalic, atraumatic Lungs- CTAB, Normal effort.  Heart- Regular rate and rhythm, No M/G/R.  GI- Soft, NT, ND.  Extremities- No clubbing, cyanosis, or edema   Radiology/Studies: No results found.  EKG:on arrival showed CHB in 64s (personally reviewed)  EKG 04/2018 shows NSR at 68 with bifascicular block and a PVC couplet  TELEMETRY: CHB in upper 20s, rarely low 30s (personally reviewed)  Assessment/Plan:  Symptomatic bradycardia Complete heart block EF 50-55% 04/2018 -> update Labs pending, INR 1.1 Not on AV nodal agents Explained risks, benefits, and alternatives to PPM implantation, including but not limited to bleeding,  infection, pneumothorax, pericardial effusion, lead dislodgement, heart attack, stroke, or death.  Pt verbalized understanding and agrees to proceed pending lab availability.   With very low HR and occasional PVCs, will likely be best served with temp wire given low likelihood of permanent pacing today. Pt is agreeable if this is MD recommendation as well.   H/o DVT On coumadin chronically.  INR 1.1, denies missed dose.  HTN Follow Avoid AV nodal agents.    For questions or updates, please contact [facility] Please consult [email or web address] for contact info under Cardiology/STEMI.  Dustin Flock, PA-C  09/24/2022 12:57 PM

## 2022-09-24 NOTE — ED Notes (Signed)
Consent at bedside.  

## 2022-09-24 NOTE — ED Provider Notes (Signed)
[place] EMERGENCY DEPARTMENT AT [facility] Provider Note   CSN: [id number] Arrival date & time: 09/24/22  1149     History  Chief Complaint  Patient presents with   Bradycardia    AAMARI Lambert is a 82 y.o. male.  HPI   82 year old male with medical history significant for HTN, HLD, GERD, nephrolithiasis who presents to the emergency department with bradycardia.  The patient states that for the past week he has noticed weakness on exertion.  He denies any chest pain or shortness of breath.  He states that he normally is able to ambulate quite a bit and was feeling profoundly weak on any attempts at exertion all week.  He went to his PCP earlier today and was found to be profoundly bradycardic with rates in the mid 20s to 30s.  He was hemodynamically stable with EMS and was transferred to the emergency department GCS 15, AAOx3. He denies symptoms at rest.  Home Medications Prior to Admission medications   Medication Sig Start Date End Date Taking? Authorizing Provider  amLODipine (NORVASC) 5 MG tablet Take 1.5 tablets (7.5 mg total) by mouth daily. 05/02/22   Dettinger, Elige Radon, MD  aspirin EC 81 MG tablet Take 81 mg by mouth daily.    [provider]  atorvastatin (LIPITOR) 40 MG tablet Take 1 tablet (40 mg total) by mouth daily. 05/02/22   Dettinger, Elige Radon, MD  Beta Carotene (VITAMIN A) 25000 UNIT capsule Take 25,000 Units by mouth daily.    [provider]  finasteride (PROSCAR) 5 MG tablet Take 1 tablet (5 mg total) by mouth daily. 08/15/22   Dettinger, Elige Radon, MD  Glucosamine-Chondroitin-MSM [phone] MG TABS Take 1 tablet by mouth daily.     [provider]  lisinopril (ZESTRIL) 10 MG tablet Take 1 tablet (10 mg total) by mouth daily. 05/02/22   Dettinger, Elige Radon, MD  Multiple Vitamins-Minerals (CVS SPECTRAVITE ADULT 50+ PO) Take 1 tablet by mouth daily. Reported on 08/01/2015    [provider]  omeprazole (PRILOSEC) 40 MG  capsule Take 1 capsule (40 mg total) by mouth daily. 05/02/22   Dettinger, Elige Radon, MD      Allergies    Penicillins and Sulfonamide derivatives    Review of Systems   Review of Systems  Neurological:  Positive for weakness.  All other systems reviewed and are negative.   Physical Exam Updated Vital Signs BP (!) 138/52 (BP Location: Right Arm)   Pulse (!) 38   Temp 97.6 F (36.4 C) (Oral)   Resp 13   Ht 5\' 8"  (1.727 m)   Wt 79.8 kg   SpO2 100%   BMI 26.76 kg/m  Physical Exam Vitals and nursing note reviewed.  Constitutional:      General: He is not in acute distress.    Appearance: He is well-developed.     Comments: GCS 15, AAOx3  HENT:     Head: Normocephalic and atraumatic.  Eyes:     Conjunctiva/sclera: Conjunctivae normal.  Cardiovascular:     Rate and Rhythm: Regular rhythm. Bradycardia present.     Pulses: Normal pulses.     Comments: Strong radial pulse Pulmonary:     Effort: Pulmonary effort is normal. No respiratory distress.     Breath sounds: Normal breath sounds.  Abdominal:     Palpations: Abdomen is soft.     Tenderness: There is no abdominal tenderness.  Musculoskeletal:        General: No  swelling.     Cervical back: Neck supple.  Skin:    General: Skin is warm and dry.     Capillary Refill: Capillary refill takes less than 2 seconds.  Neurological:     General: No focal deficit present.     Mental Status: He is alert. Mental status is at baseline.  Psychiatric:        Mood and Affect: Mood normal.     ED Results / Procedures / Treatments   Labs (all labs ordered are listed, but only abnormal results are displayed) Labs Reviewed  CBC WITH DIFFERENTIAL/PLATELET - Abnormal; Notable for the following components:      Result Value   RBC 3.71 (*)    Hemoglobin 11.2 (*)    HCT 34.9 (*)    All other components within normal limits  PROTIME-INR  COMPREHENSIVE METABOLIC PANEL  MAGNESIUM  TROPONIN I (HIGH SENSITIVITY)    EKG EKG  Interpretation Date/Time:  Monday September 24 2022 12:00:00 EDT Ventricular Rate:  27 PR Interval:    QRS Duration:  195 QT Interval:  586 QTC Calculation: 393 R Axis:   168  Text Interpretation: Junctional rhythm appears to be complete heart block Confirmed by Ernie Avena ([id number]) on 09/24/2022 1:26:38 PM  Radiology No results found.  Procedures .Critical Care  Performed by: Ernie Avena, MD Authorized by: Ernie Avena, MD   Critical care provider statement:    Critical care time (minutes):  30   Critical care was necessary to treat or prevent imminent or life-threatening deterioration of the following conditions:  Cardiac failure   Critical care was time spent personally by me on the following activities:  Development of treatment plan with patient or surrogate, discussions with consultants, evaluation of patient's response to treatment, examination of patient, ordering and review of laboratory studies, ordering and review of radiographic studies, ordering and performing treatments and interventions, pulse oximetry, re-evaluation of patient's condition and review of old charts   Care discussed with: admitting provider       Medications Ordered in ED Medications - No data to display  ED Course/ Medical Decision Making/ A&P                             Medical Decision Making Amount and/or Complexity of Data Reviewed Labs: ordered. Radiology: ordered.  Risk Decision regarding hospitalization.     82 year old male with medical history significant for HTN, HLD, GERD, nephrolithiasis who presents to the emergency department with bradycardia.  The patient states that for the past week he has noticed weakness on exertion.  He denies any chest pain or shortness of breath.  He states that he normally is able to ambulate quite a bit and was feeling profoundly weak on any attempts at exertion all week.  He went to his PCP earlier today and was found to be profoundly bradycardic with  rates in the mid 20s to 30s.  He was hemodynamically stable with EMS and was transferred to the emergency department GCS 15, AAOx3. He denies symptoms at rest.  On arrival, the patient was afebrile, bradycardic heart rate 29, a junctional rhythm versus complete heart block noted on cardiac telemetry, hemodynamically stable by manual pressure BP 138/52, saturating 100% on room air.  The patient was placed on Zoll monitoring and advised of the plan for cardiology consultation and likely admission.  EKG revealed a junctional rhythm, rate 27, likely CHB noted. No STEMI.   CXR: pending  Labs: pending  Consult: Cardiology evaluated the patient and admitted him and subsequently brought him to the cardiac catheterization lab for temporary pacemaker wiring.  Labs and imaging pending at time of admission.  The patient was admitted in critical but stable condition.  Final Clinical Impression(s) / ED Diagnoses Final diagnoses:  Complete heart block (HCC)  Symptomatic bradycardia    Rx / DC Orders ED Discharge Orders     None         Ernie Avena, MD 09/24/22 1327

## 2022-09-24 NOTE — ED Triage Notes (Signed)
Per EMS, Pt, from PCP office, c/o weakness w/ exertion x1 week.  Denies pain and n/v/d.  Pt was found to have heart rate between 27-35.  Pt is A&Ox4.  Denies cardiac history.

## 2022-09-24 NOTE — Progress Notes (Signed)
  Initial labs include Potassium2.4* (07/08 1209) Magnesium  0.9* (07/08 1209) Creatinine, ser  0.80 (07/08 1209) PLT  156 (07/08 1209) HGB  11.2* (07/08 1209) WBC 4.4 (07/08 1209) Troponin I (High Sensitivity)9 (07/08 1209).   Calcium 5.2 (7 when corrected for albumin)   With multiple discrepant values, will stat repeat and treat pending results.   Will give 2g of Mg and 20 meq of potassium to start, and adjust as needed once repeats result.  Casimiro Needle [address] [place], [place]  09/24/2022 4:42 PM

## 2022-09-24 NOTE — Interval H&P Note (Signed)
History and Physical Interval Note:  09/24/2022 1:25 PM  Shawn Lambert  has presented today for surgery, with the diagnosis of heart block.  The various methods of treatment have been discussed with the patient and family. After consideration of risks, benefits and other options for treatment, the patient has consented to  Procedure(s): TEMPORARY PACEMAKER (N/A) as a surgical intervention.  The patient's history has been reviewed, patient examined, no change in status, stable for surgery.  I have reviewed the patient's chart and labs.  Questions were answered to the patient's satisfaction.     [name]

## 2022-09-24 NOTE — Progress Notes (Signed)
 BP 132/60   Pulse (!) 31   Ht 5' 8" (1.727 m)   Wt 176 lb (79.8 kg)   SpO2 98%   BMI 26.76 kg/m    Subjective:   Patient ID: Shawn Lambert, male    DOB: 05/16/1940, 82 y.o.   MRN: [id number]  HPI: Shawn Lambert is a 82 y.o. male presenting on 09/24/2022 for Fatigue   HPI Fatigue Patient comes in for new fatigue.  He says over the past week he has just become extremely fatigued and has no energy. 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  He says he does not know anything else that changed.  He denies any chest pain or shortness of breath or fevers or chills.  He denies any constipation or bowel issues or diarrhea or blood in his stool.  He denies any urinary symptoms.  He says his blood pressures been running okay but on the heart rate sometimes his machine gives him an error.  His pulse is 31 today.  Relevant past medical, surgical, family and social history reviewed and updated as indicated. Interim medical history since our last visit reviewed. Allergies and medications reviewed and updated.  Review of Systems  Constitutional:  Positive for fatigue. Negative for chills and fever.  Eyes:  Negative for visual disturbance.  Respiratory:  Negative for shortness of breath and wheezing.   Cardiovascular:  Negative for chest pain and leg swelling.  Gastrointestinal:  Negative for abdominal pain, constipation, diarrhea, nausea and vomiting.  Musculoskeletal:  Negative for back pain and gait problem.  Skin:  Negative for rash.  Neurological:  Positive for weakness. Negative for dizziness, light-headedness and headaches.  All other systems reviewed and are negative.   Per HPI unless specifically indicated above   Allergies as of 09/24/2022       Reactions   Penicillins Itching   Has patient had a PCN reaction causing immediate rash,  facial/tongue/throat swelling, SOB or lightheadedness with hypotension: no Has patient had a PCN reaction causing severe rash involving mucus membranes or skin necrosis: no Has patient had a PCN reaction that required hospitalization no Has patient had a PCN reaction occurring within the last 10 years: no If all of the above answers are "NO", then may proceed with Cephalosporin use.   Sulfonamide Derivatives Other (See Comments)   Reaction not known        Medication List        Accurate as of September 24, 2022 10:34 AM. If you have any questions, ask your nurse or doctor.          amLODipine 5 MG tablet Commonly known as: NORVASC Take 1.5 tablets (7.5 mg total) by mouth daily.   aspirin EC 81 MG tablet Take 81 mg by mouth daily.   atorvastatin 40 MG tablet Commonly known as: LIPITOR Take 1 tablet (40 mg total) by mouth daily.   [facility] SPECTRAVITE ADULT 50+ PO Take 1 tablet by mouth daily. Reported on 08/01/2015   finasteride 5 MG tablet Commonly known as: Proscar Take 1 tablet (5 mg total) by mouth daily.   Glucosamine-Chondroitin-MSM 750-400-375 MG Tabs Take 1 tablet by mouth daily.   lisinopril 10 MG tablet Commonly known as: ZESTRIL Take 1 tablet (10 mg total) by mouth daily.   omeprazole 40 MG capsule   Commonly known as: PRILOSEC Take 1 capsule (40 mg total) by mouth daily.   vitamin A 25000 UNIT capsule Take 25,000 Units by mouth daily.         Objective:   BP 132/60   Pulse (!) 31   Ht 5' 8" (1.727 m)   Wt 176 lb (79.8 kg)   SpO2 98%   BMI 26.76 kg/m   Wt Readings from Last 3 Encounters:  09/24/22 176 lb (79.8 kg)  08/15/22 175 lb (79.4 kg)  07/05/22 169 lb (76.7 kg)    Physical Exam Vitals and nursing note reviewed.  Constitutional:      General: He is not in acute distress.    Appearance: He is well-developed. He is not diaphoretic.  Eyes:     General: No scleral icterus.    Conjunctiva/sclera: Conjunctivae normal.  Neck:     Thyroid: No  thyromegaly.  Cardiovascular:     Rate and Rhythm: Regular rhythm. Bradycardia present.     Heart sounds: Normal heart sounds. No murmur heard. Pulmonary:     Effort: Pulmonary effort is normal. No respiratory distress.     Breath sounds: Normal breath sounds. No wheezing.  Abdominal:     General: Abdomen is flat. Bowel sounds are normal. There is no distension.     Tenderness: There is no abdominal tenderness. There is no guarding or rebound.  Musculoskeletal:        General: No swelling. Normal range of motion.     Cervical back: Neck supple.  Lymphadenopathy:     Cervical: No cervical adenopathy.  Skin:    General: Skin is warm and dry.     Findings: No rash.  Neurological:     Mental Status: He is alert and oriented to person, place, and time.     Coordination: Coordination normal.  Psychiatric:        Behavior: Behavior normal.    EKG: Bradycardia with heart rate in the 29 range.  Wide-complex QRS consistent with right bundle blanch block no Q waves or ST changes that are significant.  Right bundle blanch block is present but sinus bradycardia is new   Assessment & Plan:   Problem List Items Addressed This Visit   None Visit Diagnoses     Other fatigue    -  Primary   Relevant Orders   EKG 12-Lead (Completed)   Ambulatory referral to Cardiology   CBC with Differential/Platelet   CMP14+EGFR   TSH   Symptomatic bradycardia       Relevant Orders   EKG 12-Lead (Completed)   Ambulatory referral to Cardiology   CBC with Differential/Platelet   CMP14+EGFR   TSH   Complete heart block (HCC)           Patient has hemodynamically stable new symptomatic bradycardia.  Attempted to call contact the on-call cardiologist and they were unable to contact them and they are going to try and call us back.  For now he is hemodynamically stable and place stat referral  Spoke with cardiology and they said he needs to go to the emergency department because it looks like complete  heart block. Dr Mallampati, will go via EMS Follow up plan: Return if symptoms worsen or fail to improve.  Counseling provided for all of the vaccine components Orders Placed This Encounter  Procedures   CBC with Differential/Platelet   CMP14+EGFR   TSH   Ambulatory referral to Cardiology   EKG 12-Lead    Antrice Pal, MD [facility] 09/24/2022, 10:34 AM     

## 2022-09-24 NOTE — H&P (View-Only) (Signed)
BP 132/60   Pulse (!) 31   Ht 5\' 8"  (1.727 m)   Wt 176 lb (79.8 kg)   SpO2 98%   BMI 26.76 kg/m    Subjective:   Patient ID: Shawn Lambert, male    DOB: 1940-08-06, 82 y.o.   MRN: [id number]  HPI: Shawn Lambert is a 82 y.o. male presenting on 09/24/2022 for Fatigue   HPI Fatigue Patient comes in for new fatigue.  He says over the past week he has just become extremely fatigued and has no energy. 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  He says he does not know anything else that changed.  He denies any chest pain or shortness of breath or fevers or chills.  He denies any constipation or bowel issues or diarrhea or blood in his stool.  He denies any urinary symptoms.  He says his blood pressures been running okay but on the heart rate sometimes his machine gives him an error.  His pulse is 31 today.  Relevant past medical, surgical, family and social history reviewed and updated as indicated. Interim medical history since our last visit reviewed. Allergies and medications reviewed and updated.  Review of Systems  Constitutional:  Positive for fatigue. Negative for chills and fever.  Eyes:  Negative for visual disturbance.  Respiratory:  Negative for shortness of breath and wheezing.   Cardiovascular:  Negative for chest pain and leg swelling.  Gastrointestinal:  Negative for abdominal pain, constipation, diarrhea, nausea and vomiting.  Musculoskeletal:  Negative for back pain and gait problem.  Skin:  Negative for rash.  Neurological:  Positive for weakness. Negative for dizziness, light-headedness and headaches.  All other systems reviewed and are negative.   Per HPI unless specifically indicated above   Allergies as of 09/24/2022       Reactions   Penicillins Itching   Has patient had a PCN reaction causing immediate rash,  facial/tongue/throat swelling, SOB or lightheadedness with hypotension: no Has patient had a PCN reaction causing severe rash involving mucus membranes or skin necrosis: no Has patient had a PCN reaction that required hospitalization no Has patient had a PCN reaction occurring within the last 10 years: no If all of the above answers are "NO", then may proceed with Cephalosporin use.   Sulfonamide Derivatives Other (See Comments)   Reaction not known        Medication List        Accurate as of September 24, 2022 10:34 AM. If you have any questions, ask your nurse or doctor.          amLODipine 5 MG tablet Commonly known as: NORVASC Take 1.5 tablets (7.5 mg total) by mouth daily.   aspirin EC 81 MG tablet Take 81 mg by mouth daily.   atorvastatin 40 MG tablet Commonly known as: LIPITOR Take 1 tablet (40 mg total) by mouth daily.   [facility]S SPECTRAVITE ADULT 50+ PO Take 1 tablet by mouth daily. Reported on 08/01/2015   finasteride 5 MG tablet Commonly known as: Proscar Take 1 tablet (5 mg total) by mouth daily.   Glucosamine-Chondroitin-MSM 750-400-375 MG Tabs Take 1 tablet by mouth daily.   lisinopril 10 MG tablet Commonly known as: ZESTRIL Take 1 tablet (10 mg total) by mouth daily.   omeprazole 40 MG capsule  Commonly known as: PRILOSEC Take 1 capsule (40 mg total) by mouth daily.   vitamin A 16109 UNIT capsule Take 25,000 Units by mouth daily.         Objective:   BP 132/60   Pulse (!) 31   Ht 5\' 8"  (1.727 m)   Wt 176 lb (79.8 kg)   SpO2 98%   BMI 26.76 kg/m   Wt Readings from Last 3 Encounters:  09/24/22 176 lb (79.8 kg)  08/15/22 175 lb (79.4 kg)  07/05/22 169 lb (76.7 kg)    Physical Exam Vitals and nursing note reviewed.  Constitutional:      General: He is not in acute distress.    Appearance: He is well-developed. He is not diaphoretic.  Eyes:     General: No scleral icterus.    Conjunctiva/sclera: Conjunctivae normal.  Neck:     Thyroid: No  thyromegaly.  Cardiovascular:     Rate and Rhythm: Regular rhythm. Bradycardia present.     Heart sounds: Normal heart sounds. No murmur heard. Pulmonary:     Effort: Pulmonary effort is normal. No respiratory distress.     Breath sounds: Normal breath sounds. No wheezing.  Abdominal:     General: Abdomen is flat. Bowel sounds are normal. There is no distension.     Tenderness: There is no abdominal tenderness. There is no guarding or rebound.  Musculoskeletal:        General: No swelling. Normal range of motion.     Cervical back: Neck supple.  Lymphadenopathy:     Cervical: No cervical adenopathy.  Skin:    General: Skin is warm and dry.     Findings: No rash.  Neurological:     Mental Status: He is alert and oriented to person, place, and time.     Coordination: Coordination normal.  Psychiatric:        Behavior: Behavior normal.    EKG: Bradycardia with heart rate in the 29 range.  Wide-complex QRS consistent with right bundle blanch block no Q waves or ST changes that are significant.  Right bundle blanch block is present but sinus bradycardia is new   Assessment & Plan:   Problem List Items Addressed This Visit   None Visit Diagnoses     Other fatigue    -  Primary   Relevant Orders   EKG 12-Lead (Completed)   Ambulatory referral to Cardiology   CBC with Differential/Platelet   CMP14+EGFR   TSH   Symptomatic bradycardia       Relevant Orders   EKG 12-Lead (Completed)   Ambulatory referral to Cardiology   CBC with Differential/Platelet   CMP14+EGFR   TSH   Complete heart block (HCC)           Patient has hemodynamically stable new symptomatic bradycardia.  Attempted to call contact the on-call cardiologist and they were unable to contact them and they are going to try and call us back.  For now he is hemodynamically stable and place stat referral  Spoke with cardiology and they said he needs to go to the emergency department because it looks like complete  heart block. Dr Mallampati, will go via EMS Follow up plan: Return if symptoms worsen or fail to improve.  Counseling provided for all of the vaccine components Orders Placed This Encounter  Procedures   CBC with Differential/Platelet   CMP14+EGFR   TSH   Ambulatory referral to Cardiology   EKG 12-Lead    Arville Care, MD [place]  [facility] 09/24/2022, 10:34 AM

## 2022-09-25 ENCOUNTER — Encounter (HOSPITAL_COMMUNITY)
Admission: EM | Disposition: A | Discharge status: Home / Self Care | Payer: Self-pay | Source: Ambulatory Visit | Attending: Cardiovascular Disease

## 2022-09-25 ENCOUNTER — Encounter (HOSPITAL_COMMUNITY): Payer: Self-pay | Admitting: Internal Medicine

## 2022-09-25 ENCOUNTER — Inpatient Hospital Stay (HOSPITAL_COMMUNITY): Payer: Medicare Other

## 2022-09-25 ENCOUNTER — Other Ambulatory Visit: Payer: Self-pay

## 2022-09-25 DIAGNOSIS — I442 Atrioventricular block, complete: Secondary | ICD-10-CM | POA: Diagnosis not present

## 2022-09-25 HISTORY — PX: PACEMAKER IMPLANT: EP1218

## 2022-09-25 LAB — BASIC METABOLIC PANEL
Anion gap: 5 (ref 5–15)
BUN: 22 mg/dL (ref 8–23)
CO2: 20 mmol/L — ABNORMAL LOW (ref 22–32)
Calcium: 8.7 mg/dL — ABNORMAL LOW (ref 8.9–10.3)
Chloride: 112 mmol/L — ABNORMAL HIGH (ref 98–111)
Creatinine, Ser: 1.22 mg/dL (ref 0.61–1.24)
GFR, Estimated: 60 mL/min — ABNORMAL LOW (ref >60)
Glucose, Bld: 111 mg/dL — ABNORMAL HIGH (ref 70–99)
Potassium: 4 mmol/L (ref 3.5–5.1)
Sodium: 137 mmol/L (ref 135–145)

## 2022-09-25 LAB — CBC
HCT: 33.6 % — ABNORMAL LOW (ref 39.0–52.0)
Hemoglobin: 11.5 g/dL — ABNORMAL LOW (ref 13.0–17.0)
MCH: 31.8 pg (ref 26.0–34.0)
MCHC: 34.2 g/dL (ref 30.0–36.0)
MCV: 92.8 fL (ref 80.0–100.0)
Platelets: 149 10*3/uL — ABNORMAL LOW (ref 150–400)
RBC: 3.62 MIL/uL — ABNORMAL LOW (ref 4.22–5.81)
RDW: 13.1 % (ref 11.5–15.5)
WBC: 5.4 10*3/uL (ref 4.0–10.5)
nRBC: 0 % (ref 0.0–0.2)

## 2022-09-25 LAB — GLUCOSE, CAPILLARY
Glucose-Capillary: 110 mg/dL — ABNORMAL HIGH (ref 70–99)
Glucose-Capillary: 120 mg/dL — ABNORMAL HIGH (ref 70–99)
Glucose-Capillary: 190 mg/dL — ABNORMAL HIGH (ref 70–99)

## 2022-09-25 LAB — PROTIME-INR
INR: 1.2 (ref 0.8–1.2)
Prothrombin Time: 15.5 seconds — ABNORMAL HIGH (ref 11.4–15.2)

## 2022-09-25 LAB — ECHOCARDIOGRAM COMPLETE
Area-P 1/2: 3.24 cm2
Calc EF: 61.1 %
Height: 68 in
MV M vel: 4.42 m/s
MV Peak grad: 78.1 mmHg
S' Lateral: 3.4 cm
Single Plane A2C EF: 64.2 %
Single Plane A4C EF: 58.3 %
Weight: 2816 oz

## 2022-09-25 SURGERY — PACEMAKER IMPLANT

## 2022-09-25 MED ORDER — FENTANYL CITRATE (PF) 100 MCG/2ML IJ SOLN
INTRAMUSCULAR | Status: DC | PRN
  Administered 2022-09-25: 25 ug via INTRAVENOUS

## 2022-09-25 MED ORDER — ONDANSETRON HCL 4 MG/2ML IJ SOLN: 4.0000 mg | Freq: Four times a day (QID) | INTRAMUSCULAR | Status: DC | PRN

## 2022-09-25 MED ORDER — VANCOMYCIN HCL IN DEXTROSE 1-5 GM/200ML-% IV SOLN
INTRAVENOUS | Status: AC
  Filled 2022-09-25: qty 200

## 2022-09-25 MED ORDER — MIDAZOLAM HCL 5 MG/5ML IJ SOLN
INTRAMUSCULAR | Status: AC
  Filled 2022-09-25: qty 5

## 2022-09-25 MED ORDER — SODIUM CHLORIDE 0.9 % IV SOLN: INTRAVENOUS | Status: DC

## 2022-09-25 MED ORDER — VANCOMYCIN HCL IN DEXTROSE 1-5 GM/200ML-% IV SOLN
1000.0000 mg | INTRAVENOUS | Status: AC
  Administered 2022-09-25: 1000 mg via INTRAVENOUS

## 2022-09-25 MED ORDER — LIDOCAINE HCL 1 % IJ SOLN
INTRAMUSCULAR | Status: AC
  Filled 2022-09-25: qty 60

## 2022-09-25 MED ORDER — SODIUM CHLORIDE 0.9 % IV SOLN
80.0000 mg | INTRAVENOUS | Status: DC
  Filled 2022-09-25: qty 2

## 2022-09-25 MED ORDER — CHLORHEXIDINE GLUCONATE 4 % EX SOLN
60.0000 mL | Freq: Once | CUTANEOUS | Status: AC
  Administered 2022-09-25: 4 via TOPICAL
  Filled 2022-09-25: qty 15

## 2022-09-25 MED ORDER — FENTANYL CITRATE (PF) 100 MCG/2ML IJ SOLN
INTRAMUSCULAR | Status: AC
  Filled 2022-09-25: qty 2

## 2022-09-25 MED ORDER — SODIUM CHLORIDE 0.9 % IV SOLN
INTRAVENOUS | Status: AC
  Filled 2022-09-25: qty 2

## 2022-09-25 MED ORDER — MUPIROCIN 2 % EX OINT
TOPICAL_OINTMENT | Freq: Two times a day (BID) | CUTANEOUS | Status: DC
  Filled 2022-09-25 (×2): qty 22

## 2022-09-25 MED ORDER — HEPARIN (PORCINE) IN NACL 1000-0.9 UT/500ML-% IV SOLN
INTRAVENOUS | Status: DC | PRN
  Administered 2022-09-25: 500 mL

## 2022-09-25 MED ORDER — MIDAZOLAM HCL 5 MG/5ML IJ SOLN
INTRAMUSCULAR | Status: DC | PRN
  Administered 2022-09-25: 1 mg via INTRAVENOUS

## 2022-09-25 MED ORDER — ACETAMINOPHEN 325 MG PO TABS
325.0000 mg | ORAL_TABLET | ORAL | Status: DC | PRN
  Administered 2022-09-25: 650 mg via ORAL
  Filled 2022-09-25: qty 2

## 2022-09-25 MED ORDER — LIDOCAINE HCL (PF) 1 % IJ SOLN
INTRAMUSCULAR | Status: DC | PRN
  Administered 2022-09-25: 50 mL

## 2022-09-25 MED ORDER — CHLORHEXIDINE GLUCONATE 4 % EX SOLN: 60.0000 mL | Freq: Once | CUTANEOUS | Status: AC

## 2022-09-25 SURGICAL SUPPLY — 12 items

## 2022-09-25 NOTE — Progress Notes (Signed)
  Echocardiogram 2D Echocardiogram has been performed.  Shawn Lambert 09/25/2022, 11:33 AM

## 2022-09-25 NOTE — Progress Notes (Signed)
  Patient Name: Shawn Lambert Date of Encounter: 09/25/2022  Primary Cardiologist: None Electrophysiologist: New  Interval Summary   S/p temp wire 7/8 Underlying is junctional escape in the 30s with sinus rhythm   The patient is doing well today.  At this time, the patient denies chest pain, shortness of breath, or any new concerns.  Inpatient Medications    Scheduled Meds:  amLODipine  5 mg Oral Daily   atorvastatin  40 mg Oral Daily   Chlorhexidine Gluconate Cloth  6 each Topical Daily   finasteride  5 mg Oral Daily   insulin aspart  0-9 Units Subcutaneous TID WC   lisinopril  10 mg Oral Daily   mupirocin ointment   Nasal BID   pantoprazole  40 mg Oral Daily   Continuous Infusions:  sodium chloride 10 mL/hr at 09/25/22 0700   sodium chloride Stopped (09/24/22 1719)   sodium chloride     PRN Meds: sodium chloride, sodium chloride, mouth rinse   Vital Signs    Vitals:   09/25/22 0000 09/25/22 0100 09/25/22 0315 09/25/22 0400  BP:  122/76  (!) 149/68  Pulse: 61   60  Resp: (!) 23   (!) 24  Temp:   98.1 F (36.7 C)   TempSrc:   Oral   SpO2: 99%   93%  Weight:      Height:        Intake/Output Summary (Last 24 hours) at 09/25/2022 0805 Last data filed at 09/25/2022 0700 Gross per 24 hour  Intake 214.89 ml  Output 2275 ml  Net -2060.11 ml   Filed Weights   09/24/22 1201  Weight: 79.8 kg    Physical Exam    GEN- The patient is well appearing, alert and oriented x 3 today.   Lungs- Clear to ausculation bilaterally, normal work of breathing Cardiac- Regular rate and rhythm, no murmurs, rubs or gallops GI- soft, NT, ND, + BS Extremities- no clubbing or cyanosis. No edema  Telemetry    V paced at 60 (personally reviewed)  Hospital Course    Shawn Lambert is a 82 y.o. male with a history of DM2, HTN, HLD, h/o DVT, and BPH who is being seen today for the evaluation of CHB at the request of Dr. Karene Fry.   Assessment & Plan    Symptomatic  bradycardia Complete heart block S/p temp wire 7/8; Threshold < 1 this am. EF 50-55% 04/2018 -> update pending Without reversible cause. Not on AV nodal agents Explained risks, benefits, and alternatives to PPM implantation, including but not limited to bleeding, infection, pneumothorax, pericardial effusion, lead dislodgement, heart attack, stroke, or death.  Pt verbalized understanding and agrees to proceed.     H/o DVT Confirmed that coumadin was a historic drug and not current.    HTN Follow Avoid AV nodal agents.   For questions or updates, please contact [facility] Please consult [email or web address] for contact info under Cardiology/STEMI.  Signed, Graciella Freer, PA-C  09/25/2022, 8:05 AM

## 2022-09-26 ENCOUNTER — Other Ambulatory Visit (HOSPITAL_COMMUNITY): Payer: Self-pay

## 2022-09-26 ENCOUNTER — Inpatient Hospital Stay (HOSPITAL_COMMUNITY): Payer: Medicare Other

## 2022-09-26 ENCOUNTER — Encounter (HOSPITAL_COMMUNITY): Payer: Self-pay | Admitting: Cardiology

## 2022-09-26 DIAGNOSIS — I442 Atrioventricular block, complete: Secondary | ICD-10-CM | POA: Diagnosis not present

## 2022-09-26 LAB — GLUCOSE, CAPILLARY: Glucose-Capillary: 140 mg/dL — ABNORMAL HIGH (ref 70–99)

## 2022-09-26 MED ORDER — ASPIRIN EC 81 MG PO TBEC: 81.0000 mg | DELAYED_RELEASE_TABLET | Freq: Every day | ORAL | 11 refills | Status: AC

## 2022-09-26 NOTE — Discharge Instructions (Signed)
After Your Pacemaker   You have a Medtronic Pacemaker  ACTIVITY Do not lift your arm above shoulder height for 1 week after your procedure. After 7 days, you may progress as below.  You should remove your sling 24 hours after your procedure, unless otherwise instructed by your provider.     Wednesday October 03, 2022  Thursday October 04, 2022 Friday October 05, 2022 Saturday October 06, 2022   Do not lift, push, pull, or carry anything over 10 pounds with the affected arm until 6 weeks (Wednesday November 07, 2022 ) after your procedure.   You may drive AFTER your wound check, unless you have been told otherwise by your provider.   Ask your healthcare provider when you can go back to work   INCISION/Dressing If you are on a blood thinner such as Coumadin, Xarelto, Eliquis, Plavix, or Pradaxa please confirm with your provider when this should be resumed.   If large square, outer bandage is left in place, this can be removed after 24 hours from your procedure. Do not remove steri-strips or glue as below.   If a PRESSURE DRESSING (a bulky dressing that usually goes up over your shoulder) was applied or left in place, please follow instructions given by your provider on when to return to have this removed.   Monitor your Pacemaker site for redness, swelling, and drainage. Call the device clinic at [phone] if you experience these symptoms or fever/chills.  If your incision is sealed with Steri-strips or staples, you may shower 7 days after your procedure or when told by your provider. Do not remove the steri-strips or let the shower hit directly on your site. You may wash around your site with soap and water.    If you were discharged in a sling, please do not wear this during the day more than 48 hours after your surgery unless otherwise instructed. This may increase the risk of stiffness and soreness in your shoulder.   Avoid lotions, ointments, or perfumes over your incision until it is  well-healed.  You may use a hot tub or a pool AFTER your wound check appointment if the incision is completely closed.  Pacemaker Alerts:  Some alerts are vibratory and others beep. These are NOT emergencies. Please call our office to let us know. If this occurs at night or on weekends, it can wait until the next business day. Send a remote transmission.  If your device is capable of reading fluid status (for heart failure), you will be offered monthly monitoring to review this with you.   DEVICE MANAGEMENT Remote monitoring is used to monitor your pacemaker from home. This monitoring is scheduled every 91 days by our office. It allows [place] to keep an eye on the functioning of your device to ensure it is working properly. You will routinely see your Electrophysiologist annually (more often if necessary).   You should receive your ID card for your new device in 4-8 weeks. Keep this card with you at all times once received. Consider wearing a medical alert bracelet or necklace.  Your Pacemaker may be MRI compatible. This will be discussed at your next office visit/wound check.  You should avoid contact with strong electric or magnetic fields.   Do not use amateur (ham) radio equipment or electric (arc) welding torches. MP3 player headphones with magnets should not be used. Some devices are safe to use if held at least 12 inches (30 cm) from your Pacemaker. These include power tools, lawn  mowers, and speakers. If you are unsure if something is safe to use, ask your health care provider.  When using your cell phone, hold it to the ear that is on the opposite side from the Pacemaker. Do not leave your cell phone in a pocket over the Pacemaker.  You may safely use electric blankets, heating pads, computers, and microwave ovens.  Call the office right away if: You have chest pain. You feel more short of breath than you have felt before. You feel more light-headed than you have felt before. Your  incision starts to open up.  This information is not intended to replace advice given to you by your health care provider. Make sure you discuss any questions you have with your health care provider.

## 2022-09-26 NOTE — Discharge Summary (Signed)
ELECTROPHYSIOLOGY PROCEDURE DISCHARGE SUMMARY    Patient ID: Shawn Lambert,  MRN: [id number], DOB/AGE: [birth year] 82 y.o.  Admit date: 09/24/2022 Discharge date: 09/26/2022  Primary Care Physician: Dettinger, Elige Radon, MD  Primary Cardiologist: None  Electrophysiologist: Dr. Lalla Brothers   Primary Discharge Diagnosis:  Symptomatic bradycardia status post pacemaker implantation this admission  Secondary Discharge Diagnosis:  DM2 HTN Distant history of DVT BPH  Allergies  Allergen Reactions   Penicillins Itching   Sulfonamide Derivatives Other (See Comments)    Reaction not known     Procedures This Admission:  1.  Implantation of a Medtronic Dual Chamber PPM on 09/25/2022 by Dr. Lalla Brothers. The patient received a Medtronic Azure XT DR [id number]  with a Medtronic CapSureFix Novus 5076 right atrial lead and a Medtronic SelectSure 3830 right ventricular lead.  There were no immediate post procedure complications.   2.  CXR on 09/26/2022 demonstrated no pneumothorax status post device implantation.       Brief HPI: Shawn Lambert is a 82 y.o. male was admitted for symptomatic bradycardia and electrophysiology team asked to see for consideration of PPM implantation.  Past medical history includes above.  The patient has had AV block without reversible causes identified.  Risks, benefits, and alternatives to PPM implantation were reviewed with the patient who wished to proceed.   Hospital Course:  The patient was admitted and urgently taken for temp wire given HRs in the 20s. He was stable on this, and the next day underwent implantation of a Medtronic dual chamber PPM with details as outlined above.  He was monitored on telemetry overnight which demonstrated appropriate pacing.  Left chest was without hematoma or ecchymosis.  The device was interrogated and found to be functioning normally.  CXR was obtained and demonstrated no pneumothorax status post device implantation.  Wound care,  arm mobility, and restrictions were reviewed with the patient.  The patient was examined and considered stable for discharge to home.    Anticoagulation resumption This patient is not on anticoagulation     Physical Exam: Vitals:   09/26/22 0600 09/26/22 0700 09/26/22 0800 09/26/22 0903  BP: (!) 149/72 137/69 (!) 146/71 (!) 146/104  Pulse: 66 (!) 58 60   Resp: 20 (!) 25 (!) 37   Temp:      TempSrc:      SpO2: 96% 93% 98%   Weight:      Height:        GEN- NAD. A&O x 3.  HEENT: Normocephalic, atraumatic Lungs- CTAB, Normal effort.  Heart- RRR, No M/G/R.  GI- Soft, NT, ND.  Extremities- No clubbing, cyanosis, or edema;  Skin- warm and dry, no rash or lesion, left chest without hematoma/ecchymosis  Discharge Medications:  Allergies as of 09/26/2022       Reactions   Penicillins Itching   Sulfonamide Derivatives Other (See Comments)   Reaction not known        Medication List     TAKE these medications    amLODipine 5 MG tablet Commonly known as: NORVASC Take 1.5 tablets (7.5 mg total) by mouth daily.   aspirin EC 81 MG tablet Take 1 tablet (81 mg total) by mouth daily. Start taking on: September 30, 2022 What changed: These instructions start on September 30, 2022. If you are unsure what to do until then, ask your doctor or other care provider.   atorvastatin 40 MG tablet Commonly known as: LIPITOR Take 1 tablet (40 mg total) by mouth daily.  CVS SPECTRAVITE ADULT 50+ PO Take 1 tablet by mouth daily. Reported on 08/01/2015   finasteride 5 MG tablet Commonly known as: Proscar Take 1 tablet (5 mg total) by mouth daily.   Glucosamine-Chondroitin-MSM 750-400-375 MG Tabs Take 1 tablet by mouth daily.   lisinopril 10 MG tablet Commonly known as: ZESTRIL Take 1 tablet (10 mg total) by mouth daily.   omeprazole 40 MG capsule Commonly known as: PRILOSEC Take 1 capsule (40 mg total) by mouth daily.   vitamin A 40981 UNIT capsule Take 25,000 Units by mouth daily.         Disposition:    Follow-up Information     [facility] Follow up.   Specialty: Cardiology Why: on 7/25 at 2 pm for post pacemaker follow up Contact information: [address], [address] [id number] mc [place] [address] [phone]                Duration of Discharge Encounter: Greater than 30 minutes including physician time.  Dustin Flock, PA-C  09/26/2022 10:17 AM

## 2022-09-26 NOTE — Progress Notes (Signed)
PIV removed. Discharge AVS given and explained, all questions answered. Medtronic box given to patient. Patient taken to discharge lounge per patient request to wait for son to pick him up.

## 2022-09-27 ENCOUNTER — Telehealth: Payer: Self-pay

## 2022-09-27 NOTE — Transitions of Care (Post Inpatient/ED Visit) (Signed)
09/27/2022  Name: Shawn Lambert MRN: [id number] DOB: [birth year]  Today's TOC FU Call Status: Today's TOC FU Call Status:: Successful TOC FU Call Competed TOC FU Call Complete Date: 09/27/22  Transition Care Management Follow-up Telephone Call Date of Discharge: 09/26/22 Discharge Facility: [facility]) Type of Discharge: Inpatient Admission Primary Inpatient Discharge Diagnosis:: Bradycardia How have you been since you were released from the hospital?: Better Any questions or concerns?: No  Items Reviewed: Did you receive and understand the discharge instructions provided?: Yes Medications obtained,verified, and reconciled?: Yes (Medications Reviewed) Any new allergies since your discharge?: No Dietary orders reviewed?: No Do you have support at home?: Yes People in Home: child(ren), adult Name of Support/Comfort Primary Source: Tammy Sours  Medications Reviewed Today: Medications Reviewed Today     Reviewed by Jodelle Gross, RN (Case Manager) on 09/27/22 at 1436  Med List Status: <None>   Medication Order Taking? Sig Documenting Provider Last Dose Status Informant  amLODipine (NORVASC) 5 MG tablet [id number] Yes Take 1.5 tablets (7.5 mg total) by mouth daily. Dettinger, Elige Radon, MD Taking Active Self  aspirin EC 81 MG tablet [id number] Yes Take 1 tablet (81 mg total) by mouth daily. Graciella Freer, PA-C Taking Active   atorvastatin (LIPITOR) 40 MG tablet [id number] Yes Take 1 tablet (40 mg total) by mouth daily. Dettinger, Elige Radon, MD Taking Active Self  Beta Carotene (VITAMIN A) 25000 UNIT capsule [id number]  Take 25,000 Units by mouth daily. [provider]  Active Self  Betamethasone Sod Phos & Acet 30 MG/5ML KIT 2 mL [id number]   Dettinger, Elige Radon, MD  Active   finasteride (PROSCAR) 5 MG tablet [id number] Yes Take 1 tablet (5 mg total) by mouth daily. Dettinger, Elige Radon, MD Taking Active Self  Glucosamine-Chondroitin-MSM [phone] MG TABS [id number] Yes  Take 1 tablet by mouth daily.  [provider] Taking Active Self  lisinopril (ZESTRIL) 10 MG tablet [id number] Yes Take 1 tablet (10 mg total) by mouth daily. Dettinger, Elige Radon, MD Taking Active Self  Multiple Vitamins-Minerals (CVS SPECTRAVITE ADULT 50+ PO) [id number] Yes Take 1 tablet by mouth daily. Reported on 08/01/2015 [provider] Taking Active Self  omeprazole (PRILOSEC) 40 MG capsule [id number] Yes Take 1 capsule (40 mg total) by mouth daily. Dettinger, Elige Radon, MD Taking Active Self            Home Care and Equipment/Supplies: Were Home Health Services Ordered?: No Any new equipment or medical supplies ordered?: No  Functional Questionnaire: Do you need assistance with bathing/showering or dressing?: No Do you need assistance with meal preparation?: No Do you need assistance with eating?: No Do you have difficulty maintaining continence: No Do you need assistance with getting out of bed/getting out of a chair/moving?: No Do you have difficulty managing or taking your medications?: No  Follow up appointments reviewed: PCP Follow-up appointment confirmed?: Yes Date of PCP follow-up appointment?: 11/15/22 Follow-up Provider: Dr. Louanne Skye Specialist [facility] Follow-up appointment confirmed?: Yes Date of Specialist follow-up appointment?: 10/11/22 Follow-Up Specialty Provider:: [facility] for wound check Do you need transportation to your follow-up appointment?: No Do you understand care options if your condition(s) worsen?: Yes-patient verbalized understanding  SDOH Interventions Today    Flowsheet Row Most Recent Value  SDOH Interventions   Food Insecurity Interventions Intervention Not Indicated  Housing Interventions Intervention Not Indicated  Transportation Interventions Intervention Not Indicated     Jodelle Gross, RN, BSN, CCM Care Management Coordinator [facility] Phone: [phone]/Fax: [phone]

## 2022-09-28 LAB — SURGICAL PCR SCREEN
MRSA, PCR: NEGATIVE
Staphylococcus aureus: NEGATIVE

## 2022-10-11 ENCOUNTER — Ambulatory Visit: Payer: Medicare Other | Attending: Cardiovascular Disease

## 2022-10-11 DIAGNOSIS — I442 Atrioventricular block, complete: Secondary | ICD-10-CM | POA: Insufficient documentation

## 2022-10-11 LAB — CUP PACEART INCLINIC DEVICE CHECK
Date Time Interrogation Session: 20240725151337
Implantable Lead Connection Status: 753985
Implantable Lead Connection Status: 753985
Implantable Lead Implant Date: 20240709
Implantable Lead Implant Date: 20240709
Implantable Lead Location: 753859
Implantable Lead Location: 753860
Implantable Lead Model: 3830
Implantable Lead Model: 5076
Implantable Pulse Generator Implant Date: 20240709

## 2022-10-11 NOTE — Progress Notes (Signed)

## 2022-10-11 NOTE — Patient Instructions (Signed)

## 2022-11-12 DIAGNOSIS — T7840XA Allergy, unspecified, initial encounter: Secondary | ICD-10-CM | POA: Diagnosis not present

## 2022-11-12 DIAGNOSIS — T63441A Toxic effect of venom of bees, accidental (unintentional), initial encounter: Secondary | ICD-10-CM | POA: Diagnosis not present

## 2022-11-15 ENCOUNTER — Ambulatory Visit (INDEPENDENT_AMBULATORY_CARE_PROVIDER_SITE_OTHER): Payer: Medicare Other | Admitting: Family Medicine

## 2022-11-15 ENCOUNTER — Encounter: Payer: Self-pay | Admitting: Family Medicine

## 2022-11-15 VITALS — BP 121/68 | HR 82 | Ht 68.0 in | Wt 175.0 lb

## 2022-11-15 DIAGNOSIS — R350 Frequency of micturition: Secondary | ICD-10-CM

## 2022-11-15 DIAGNOSIS — E119 Type 2 diabetes mellitus without complications: Secondary | ICD-10-CM

## 2022-11-15 DIAGNOSIS — N401 Enlarged prostate with lower urinary tract symptoms: Secondary | ICD-10-CM

## 2022-11-15 DIAGNOSIS — E785 Hyperlipidemia, unspecified: Secondary | ICD-10-CM | POA: Diagnosis not present

## 2022-11-15 DIAGNOSIS — I10 Essential (primary) hypertension: Secondary | ICD-10-CM | POA: Diagnosis not present

## 2022-11-15 LAB — BAYER DCA HB A1C WAIVED: HB A1C (BAYER DCA - WAIVED): 6.8 % — ABNORMAL HIGH (ref 4.8–5.6)

## 2022-11-15 MED ORDER — FINASTERIDE 5 MG PO TABS
5.0000 mg | ORAL_TABLET | Freq: Every day | ORAL | 1 refills | Status: DC
Start: 2022-11-15 — End: 2023-05-23

## 2022-11-15 NOTE — Progress Notes (Signed)
BP 121/68   Pulse 82   Ht 5\' 8"  (1.727 m)   Wt 175 lb (79.4 kg)   SpO2 98%   BMI 26.61 kg/m    Subjective:   Patient ID: Shawn Lambert, male    DOB: [birth year], 82 y.o.   MRN: [id number]  HPI: Shawn Lambert is a 81 y.o. male presenting on 11/15/2022 for Medical Management of Chronic Issues and Diabetes   HPI Type 2 diabetes mellitus Patient comes in today for recheck of his diabetes. Patient has been currently taking no medicine currently, diet controlled no medicine currently, diet control. Patient is currently on an ACE inhibitor/ARB. Patient has not seen an ophthalmologist this year. Patient denies any new issues with their feet. The symptom started onset as an adult hypertension and hyperlipidemia ARE RELATED TO DM   Hypertension Patient is currently on amlodipine and lisinopril, and their blood pressure today is 121/68. Patient denies any lightheadedness or dizziness. Patient denies headaches, blurred vision, chest pains, shortness of breath, or weakness. Denies any side effects from medication and is content with current medication.   Hyperlipidemia Patient is coming in for recheck of his hyperlipidemia. The patient is currently taking atorvastatin. They deny any issues with myalgias or history of liver damage from it. They deny any focal numbness or weakness or chest pain.   BPH Patient is coming in for recheck on BPH Symptoms: Frequency Medication: Finasteride Last PSA: Less than a year ago  Relevant past medical, surgical, family and social history reviewed and updated as indicated. Interim medical history since our last visit reviewed. Allergies and medications reviewed and updated.  Review of Systems  Constitutional:  Negative for chills and fever.  Eyes:  Negative for visual disturbance.  Respiratory:  Negative for shortness of breath and wheezing.   Cardiovascular:  Negative for chest pain and leg swelling.  Musculoskeletal:  Negative for back pain and gait  problem.  Skin:  Negative for rash.  All other systems reviewed and are negative.   Per HPI unless specifically indicated above   Allergies as of 11/15/2022       Reactions   Penicillins Itching   Sulfonamide Derivatives Other (See Comments)   Reaction not known        Medication List        Accurate as of November 15, 2022 10:46 AM. If you have any questions, ask your nurse or doctor.          amLODipine 5 MG tablet Commonly known as: NORVASC Take 1.5 tablets (7.5 mg total) by mouth daily.   aspirin EC 81 MG tablet Take 1 tablet (81 mg total) by mouth daily.   atorvastatin 40 MG tablet Commonly known as: LIPITOR Take 1 tablet (40 mg total) by mouth daily.   CVS SPECTRAVITE ADULT 50+ PO Take 1 tablet by mouth daily. Reported on 08/01/2015   finasteride 5 MG tablet Commonly known as: Proscar Take 1 tablet (5 mg total) by mouth daily.   Glucosamine-Chondroitin-MSM 750-400-375 MG Tabs Take 1 tablet by mouth daily.   lisinopril 10 MG tablet Commonly known as: ZESTRIL Take 1 tablet (10 mg total) by mouth daily.   omeprazole 40 MG capsule Commonly known as: PRILOSEC Take 1 capsule (40 mg total) by mouth daily.   vitamin A 14782 UNIT capsule Take 25,000 Units by mouth daily.         Objective:   BP 121/68   Pulse 82   Ht 5\' 8"  (1.727 m)  Wt 175 lb (79.4 kg)   SpO2 98%   BMI 26.61 kg/m   Wt Readings from Last 3 Encounters:  11/15/22 175 lb (79.4 kg)  09/24/22 176 lb (79.8 kg)  09/24/22 176 lb (79.8 kg)    Physical Exam Vitals and nursing note reviewed.  Constitutional:      General: He is not in acute distress.    Appearance: He is well-developed. He is not diaphoretic.  Eyes:     General: No scleral icterus.    Conjunctiva/sclera: Conjunctivae normal.  Neck:     Thyroid: No thyromegaly.  Cardiovascular:     Rate and Rhythm: Normal rate and regular rhythm.     Heart sounds: Normal heart sounds. No murmur heard. Pulmonary:     Effort:  Pulmonary effort is normal. No respiratory distress.     Breath sounds: Normal breath sounds. No wheezing.  Musculoskeletal:        General: Normal range of motion.     Cervical back: Neck supple.  Lymphadenopathy:     Cervical: No cervical adenopathy.  Skin:    General: Skin is warm and dry.     Findings: No rash.  Neurological:     Mental Status: He is alert and oriented to person, place, and time.     Coordination: Coordination normal.  Psychiatric:        Behavior: Behavior normal.       Assessment & Plan:   Problem List Items Addressed This Visit       Cardiovascular and Mediastinum   Essential hypertension     Endocrine   T2DM (type 2 diabetes mellitus) (HCC) - Primary   Relevant Orders   Lipid panel   Bayer DCA Hb A1c Waived     Other   Hyperlipidemia LDL goal <130   Benign prostatic hyperplasia with urinary frequency   Relevant Medications   finasteride (PROSCAR) 5 MG tablet    A1c looks good at 6.8.  No changes.  Seems to be doing well. Follow up plan: Return in about 3 months (around 02/15/2023), or if symptoms worsen or fail to improve, for Hypertension and diabetes recheck.  Counseling provided for all of the vaccine components Orders Placed This Encounter  Procedures   Lipid panel   Bayer DCA Hb A1c Waived    Arville Care, MD [facility] 11/15/2022, 10:46 AM

## 2022-11-16 LAB — LIPID PANEL
Chol/HDL Ratio: 5.3 ratio — ABNORMAL HIGH (ref 0.0–5.0)
Cholesterol, Total: 171 mg/dL (ref 100–199)
HDL: 32 mg/dL — ABNORMAL LOW (ref >39)
LDL Chol Calc (NIH): 128 mg/dL — ABNORMAL HIGH (ref 0–99)
Triglycerides: 56 mg/dL (ref 0–149)
VLDL Cholesterol Cal: 11 mg/dL (ref 5–40)

## 2022-12-26 ENCOUNTER — Ambulatory Visit (INDEPENDENT_AMBULATORY_CARE_PROVIDER_SITE_OTHER): Payer: Medicare Other

## 2022-12-26 DIAGNOSIS — I442 Atrioventricular block, complete: Secondary | ICD-10-CM | POA: Diagnosis not present

## 2022-12-26 LAB — CUP PACEART REMOTE DEVICE CHECK
Battery Remaining Longevity: 145 mo
Battery Voltage: 3.18 V
Brady Statistic AP VP Percent: 49.72 %
Brady Statistic AP VS Percent: 0.1 %
Brady Statistic AS VP Percent: 49.67 %
Brady Statistic AS VS Percent: 0.51 %
Brady Statistic RA Percent Paced: 49.88 %
Brady Statistic RV Percent Paced: 99.39 %
Date Time Interrogation Session: 20241008231401
Implantable Lead Connection Status: 753985
Implantable Lead Connection Status: 753985
Implantable Lead Implant Date: 20240709
Implantable Lead Implant Date: 20240709
Implantable Lead Location: 753859
Implantable Lead Location: 753860
Implantable Lead Model: 3830
Implantable Lead Model: 5076
Implantable Pulse Generator Implant Date: 20240709
Lead Channel Impedance Value: 266 Ohm
Lead Channel Impedance Value: 342 Ohm
Lead Channel Impedance Value: 342 Ohm
Lead Channel Impedance Value: 570 Ohm
Lead Channel Pacing Threshold Amplitude: 0.625 V
Lead Channel Pacing Threshold Amplitude: 0.75 V
Lead Channel Pacing Threshold Pulse Width: 0.4 ms
Lead Channel Pacing Threshold Pulse Width: 0.4 ms
Lead Channel Sensing Intrinsic Amplitude: 1.375 mV
Lead Channel Sensing Intrinsic Amplitude: 1.375 mV
Lead Channel Sensing Intrinsic Amplitude: 29.375 mV
Lead Channel Sensing Intrinsic Amplitude: 29.375 mV
Lead Channel Setting Pacing Amplitude: 1.5 V
Lead Channel Setting Pacing Amplitude: 2 V
Lead Channel Setting Pacing Pulse Width: 0.4 ms
Lead Channel Setting Sensing Sensitivity: 4 mV
Zone Setting Status: 755011

## 2023-01-02 ENCOUNTER — Ambulatory Visit: Payer: Federal, State, Local not specified - PPO | Attending: Student | Admitting: Student

## 2023-01-03 ENCOUNTER — Encounter: Payer: Self-pay | Admitting: Student

## 2023-01-14 NOTE — Progress Notes (Signed)
Remote pacemaker transmission.   

## 2023-02-04 ENCOUNTER — Encounter: Payer: Self-pay | Admitting: Student

## 2023-02-04 ENCOUNTER — Ambulatory Visit: Payer: Medicare Other | Attending: Student | Admitting: Student

## 2023-02-04 VITALS — BP 130/66 | HR 82 | Ht 68.0 in | Wt 180.0 lb

## 2023-02-04 DIAGNOSIS — I10 Essential (primary) hypertension: Secondary | ICD-10-CM | POA: Insufficient documentation

## 2023-02-04 DIAGNOSIS — I471 Supraventricular tachycardia, unspecified: Secondary | ICD-10-CM | POA: Insufficient documentation

## 2023-02-04 DIAGNOSIS — I442 Atrioventricular block, complete: Secondary | ICD-10-CM | POA: Diagnosis not present

## 2023-02-04 LAB — CUP PACEART INCLINIC DEVICE CHECK
Battery Remaining Longevity: 144 mo
Battery Voltage: 3.17 V
Brady Statistic AP VP Percent: 53.51 %
Brady Statistic AP VS Percent: 0.11 %
Brady Statistic AS VP Percent: 45.72 %
Brady Statistic AS VS Percent: 0.66 %
Brady Statistic RA Percent Paced: 53.65 %
Brady Statistic RV Percent Paced: 99.22 %
Date Time Interrogation Session: 20241118110206
Implantable Lead Connection Status: 753985
Implantable Lead Connection Status: 753985
Implantable Lead Implant Date: 20240709
Implantable Lead Implant Date: 20240709
Implantable Lead Location: 753859
Implantable Lead Location: 753860
Implantable Lead Model: 3830
Implantable Lead Model: 5076
Implantable Pulse Generator Implant Date: 20240709
Lead Channel Impedance Value: 304 Ohm
Lead Channel Impedance Value: 361 Ohm
Lead Channel Impedance Value: 380 Ohm
Lead Channel Impedance Value: 570 Ohm
Lead Channel Pacing Threshold Amplitude: 0.625 V
Lead Channel Pacing Threshold Amplitude: 0.75 V
Lead Channel Pacing Threshold Pulse Width: 0.4 ms
Lead Channel Pacing Threshold Pulse Width: 0.4 ms
Lead Channel Sensing Intrinsic Amplitude: 1.125 mV
Lead Channel Sensing Intrinsic Amplitude: 1.5 mV
Lead Channel Sensing Intrinsic Amplitude: 22.125 mV
Lead Channel Sensing Intrinsic Amplitude: 28.75 mV
Lead Channel Setting Pacing Amplitude: 1.5 V
Lead Channel Setting Pacing Amplitude: 2 V
Lead Channel Setting Pacing Pulse Width: 0.4 ms
Lead Channel Setting Sensing Sensitivity: 4 mV
Zone Setting Status: 755011

## 2023-02-04 MED ORDER — METOPROLOL SUCCINATE ER 25 MG PO TB24
25.0000 mg | ORAL_TABLET | Freq: Every day | ORAL | 3 refills | Status: DC
Start: 2023-02-04 — End: 2023-08-23

## 2023-02-04 NOTE — Patient Instructions (Signed)
Medication Instructions:  Stop amlodipine Start metoprolol succinate (Toprol XL) 25 mg daily *If you need a refill on your cardiac medications before your next appointment, please call your pharmacy*  Lab Work: None ordered If you have labs (blood work) drawn today and your tests are completely normal, you will receive your results only by: MyChart Message (if you have MyChart) OR A paper copy in the mail If you have any lab test that is abnormal or we need to change your treatment, we will call you to review the results.  Follow-Up: At [facility], you and your health needs are our priority.  As part of our continuing mission to provide you with exceptional heart care, we have created designated Provider Care Teams.  These Care Teams include your primary Cardiologist (physician) and Advanced Practice Providers (APPs -  Physician Assistants and Nurse Practitioners) who all work together to provide you with the care you need, when you need it.  Your next appointment:   6 month(s)  Provider:   Casimiro Needle "Otilio Saber, PA-C

## 2023-02-04 NOTE — Progress Notes (Signed)
  Electrophysiology Office Note:   ID:  BISHOY RAATZ, DOB [birth year], MRN [id number]  Primary Cardiologist: None Electrophysiologist: Lanier Prude, MD      History of Present Illness:   Shawn Lambert is a 82 y.o. male with h/o HLD, HTN, and CHB s/p PPM seen today for routine electrophysiology followup.   Since last being seen in our clinic the patient reports doing well overall. Lost his brother to age/dementia earlier this fall.  Currently, pt  he denies chest pain, palpitations, dyspnea, PND, orthopnea, nausea, vomiting, dizziness, syncope, edema, weight gain, or early satiety. Is having trouble with his knees and may ask for surgery  Review of systems complete and found to be negative unless listed in HPI.   EP Information / Studies Reviewed:    EKG is ordered today. Personal review as below.  EKG Interpretation Date/Time:  Monday February 04 2023 10:43:55 EST Ventricular Rate:  82 PR Interval:  178 QRS Duration:  146 QT Interval:  408 QTC Calculation: 476 R Axis:   -28  Text Interpretation: AV dual-paced rhythm When compared with ECG of 24-Sep-2022 12:00, PREVIOUS ECG IS PRESENT Confirmed by Maxine Glenn [id number]) on 02/04/2023 11:04:42 AM    PPM Interrogation-  reviewed in detail today,  See PACEART report.  Device History: Medtronic Dual Chamber PPM implanted 09/25/2022 for CHB  Physical Exam:   VS:  BP 130/66   Pulse 82   Ht 5\' 8"  (1.727 m)   Wt 180 lb (81.6 kg)   BMI 27.37 kg/m    Wt Readings from Last 3 Encounters:  02/04/23 180 lb (81.6 kg)  11/15/22 175 lb (79.4 kg)  09/24/22 176 lb (79.8 kg)     GEN: Well nourished, well developed in no acute distress NECK: No JVD; No carotid bruits CARDIAC: Regular rate and rhythm, no murmurs, rubs, gallops RESPIRATORY:  Clear to auscultation without rales, wheezing or rhonchi  ABDOMEN: Soft, non-tender, non-distended EXTREMITIES:  No edema; No deformity   ASSESSMENT AND PLAN:    CHB s/p Medtronic PPM   Normal PPM function See Pace Art report No changes today  HTN Stable on current regimen   SVT/AT Minimally symptomatic.  Change amlodipine to Toprol and follow   Disposition:   Follow up with EP APP in 6 months  Signed, Graciella Freer, PA-C

## 2023-02-20 ENCOUNTER — Ambulatory Visit (INDEPENDENT_AMBULATORY_CARE_PROVIDER_SITE_OTHER): Payer: Medicare Other | Admitting: Family Medicine

## 2023-02-20 ENCOUNTER — Ambulatory Visit: Payer: Medicare Other

## 2023-02-20 ENCOUNTER — Encounter: Payer: Self-pay | Admitting: Family Medicine

## 2023-02-20 VITALS — BP 136/81 | HR 82 | Ht 68.0 in | Wt 180.0 lb

## 2023-02-20 DIAGNOSIS — E119 Type 2 diabetes mellitus without complications: Secondary | ICD-10-CM

## 2023-02-20 DIAGNOSIS — I10 Essential (primary) hypertension: Secondary | ICD-10-CM | POA: Diagnosis not present

## 2023-02-20 DIAGNOSIS — E785 Hyperlipidemia, unspecified: Secondary | ICD-10-CM

## 2023-02-20 DIAGNOSIS — N529 Male erectile dysfunction, unspecified: Secondary | ICD-10-CM | POA: Insufficient documentation

## 2023-02-20 DIAGNOSIS — E1159 Type 2 diabetes mellitus with other circulatory complications: Secondary | ICD-10-CM | POA: Diagnosis not present

## 2023-02-20 DIAGNOSIS — E1169 Type 2 diabetes mellitus with other specified complication: Secondary | ICD-10-CM

## 2023-02-20 LAB — LIPID PANEL
Chol/HDL Ratio: 2.9 {ratio} (ref 0.0–5.0)
Cholesterol, Total: 158 mg/dL (ref 100–199)
HDL: 54 mg/dL (ref >39)
LDL Chol Calc (NIH): 95 mg/dL (ref 0–99)
Triglycerides: 42 mg/dL (ref 0–149)
VLDL Cholesterol Cal: 9 mg/dL (ref 5–40)

## 2023-02-20 LAB — CBC WITH DIFFERENTIAL/PLATELET
Basophils Absolute: 0 10*3/uL (ref 0.0–0.2)
Basos: 0 %
EOS (ABSOLUTE): 0.2 10*3/uL (ref 0.0–0.4)
Eos: 4 %
Hematocrit: 35.9 % — ABNORMAL LOW (ref 37.5–51.0)
Hemoglobin: 11.3 g/dL — ABNORMAL LOW (ref 13.0–17.7)
Immature Grans (Abs): 0 10*3/uL (ref 0.0–0.1)
Immature Granulocytes: 0 %
Lymphocytes Absolute: 0.9 10*3/uL (ref 0.7–3.1)
Lymphs: 18 %
MCH: 30.5 pg (ref 26.6–33.0)
MCHC: 31.5 g/dL (ref 31.5–35.7)
MCV: 97 fL (ref 79–97)
Monocytes Absolute: 0.5 10*3/uL (ref 0.1–0.9)
Monocytes: 10 %
Neutrophils Absolute: 3.5 10*3/uL (ref 1.4–7.0)
Neutrophils: 68 %
Platelets: 219 10*3/uL (ref 150–450)
RBC: 3.71 x10E6/uL — ABNORMAL LOW (ref 4.14–5.80)
RDW: 12 % (ref 11.6–15.4)
WBC: 5.1 10*3/uL (ref 3.4–10.8)

## 2023-02-20 LAB — BAYER DCA HB A1C WAIVED: HB A1C (BAYER DCA - WAIVED): 7 % — ABNORMAL HIGH (ref 4.8–5.6)

## 2023-02-20 LAB — CMP14+EGFR
ALT: 15 [IU]/L (ref 0–44)
AST: 14 [IU]/L (ref 0–40)
Albumin: 3.7 g/dL (ref 3.7–4.7)
Alkaline Phosphatase: 92 [IU]/L (ref 44–121)
BUN/Creatinine Ratio: 13 (ref 10–24)
BUN: 15 mg/dL (ref 8–27)
Bilirubin Total: 0.3 mg/dL (ref 0.0–1.2)
CO2: 24 mmol/L (ref 20–29)
Calcium: 9.2 mg/dL (ref 8.6–10.2)
Chloride: 106 mmol/L (ref 96–106)
Creatinine, Ser: 1.16 mg/dL (ref 0.76–1.27)
Globulin, Total: 2.1 g/dL (ref 1.5–4.5)
Glucose: 149 mg/dL — ABNORMAL HIGH (ref 70–99)
Potassium: 4.3 mmol/L (ref 3.5–5.2)
Sodium: 142 mmol/L (ref 134–144)
Total Protein: 5.8 g/dL — ABNORMAL LOW (ref 6.0–8.5)
eGFR: 63 mL/min/{1.73_m2} (ref >59)

## 2023-02-20 NOTE — Progress Notes (Signed)
BP 136/81   Pulse 82   Ht 5\' 8"  (1.727 m)   Wt 180 lb (81.6 kg)   SpO2 98%   BMI 27.37 kg/m    Subjective:   Patient ID: Shawn Lambert, male    DOB: 01-15-1941, 82 y.o.   MRN: [id number]  HPI: Shawn Lambert is a 82 y.o. male presenting on 02/20/2023 for Medical Management of Chronic Issues and Diabetes   HPI Type 2 diabetes mellitus Patient comes in today for recheck of his diabetes. Patient has been currently taking no medicine, diet control. Patient is currently on an ACE inhibitor/ARB. Patient has seen an ophthalmologist this year. Patient denies any new issues with their feet. The symptom started onset as an adult hypertension and hyperlipidemia ARE RELATED TO DM   Hypertension Patient is currently on lisinopril and metoprolol, and their blood pressure today is 136/81. Patient denies any lightheadedness or dizziness. Patient denies headaches, blurred vision, chest pains, shortness of breath, or weakness. Denies any side effects from medication and is content with current medication.   Hyperlipidemia Patient is coming in for recheck of his hyperlipidemia. The patient is currently taking atorvastatin. They deny any issues with myalgias or history of liver damage from it. They deny any focal numbness or weakness or chest pain.   Erectile dysfunction Patient is coming in for erectile dysfunction.  He says he has a new younger girlfriend that is what prompted him looking into medicines for it.  He recently had a pacemaker placed because of bradycardia and had cardiac evaluation.  Relevant past medical, surgical, family and social history reviewed and updated as indicated. Interim medical history since our last visit reviewed. Allergies and medications reviewed and updated.  Review of Systems  Constitutional:  Negative for chills and fever.  Eyes:  Negative for visual disturbance.  Respiratory:  Negative for shortness of breath and wheezing.   Cardiovascular:  Negative for chest  pain and leg swelling.  Musculoskeletal:  Positive for arthralgias. Negative for back pain and gait problem.  Skin:  Negative for rash.  Neurological:  Negative for dizziness, weakness and light-headedness.  All other systems reviewed and are negative.   Per HPI unless specifically indicated above   Allergies as of 02/20/2023       Reactions   Penicillins Itching   Sulfonamide Derivatives Other (See Comments)   Reaction not known        Medication List        Accurate as of February 20, 2023  9:54 AM. If you have any questions, ask your nurse or doctor.          aspirin EC 81 MG tablet Take 1 tablet (81 mg total) by mouth daily.   atorvastatin 40 MG tablet Commonly known as: LIPITOR Take 1 tablet (40 mg total) by mouth daily. What changed:  when to take this additional instructions   CVS SPECTRAVITE ADULT 50+ PO Take 1 tablet by mouth daily. Reported on 08/01/2015   finasteride 5 MG tablet Commonly known as: Proscar Take 1 tablet (5 mg total) by mouth daily.   Glucosamine-Chondroitin-MSM 750-400-375 MG Tabs Take 1 tablet by mouth daily.   lisinopril 10 MG tablet Commonly known as: ZESTRIL Take 1 tablet (10 mg total) by mouth daily.   metoprolol succinate 25 MG 24 hr tablet Commonly known as: Toprol XL Take 1 tablet (25 mg total) by mouth daily.   omeprazole 40 MG capsule Commonly known as: PRILOSEC Take 1 capsule (40 mg total)  by mouth daily.   vitamin A 16109 UNIT capsule Take 25,000 Units by mouth daily.         Objective:   BP 136/81   Pulse 82   Ht 5\' 8"  (1.727 m)   Wt 180 lb (81.6 kg)   SpO2 98%   BMI 27.37 kg/m   Wt Readings from Last 3 Encounters:  02/20/23 180 lb (81.6 kg)  02/04/23 180 lb (81.6 kg)  11/15/22 175 lb (79.4 kg)    Physical Exam Vitals and nursing note reviewed.  Constitutional:      General: He is not in acute distress.    Appearance: He is well-developed. He is not diaphoretic.  Eyes:     General: No  scleral icterus.    Conjunctiva/sclera: Conjunctivae normal.  Neck:     Thyroid: No thyromegaly.  Cardiovascular:     Rate and Rhythm: Normal rate and regular rhythm.     Heart sounds: Normal heart sounds. No murmur heard. Pulmonary:     Effort: Pulmonary effort is normal. No respiratory distress.     Breath sounds: Normal breath sounds. No wheezing.  Musculoskeletal:        General: Normal range of motion.     Cervical back: Neck supple.  Lymphadenopathy:     Cervical: No cervical adenopathy.  Skin:    General: Skin is warm and dry.     Findings: No rash.  Neurological:     Mental Status: He is alert and oriented to person, place, and time.     Coordination: Coordination normal.  Psychiatric:        Behavior: Behavior normal.       Assessment & Plan:   Problem List Items Addressed This Visit       Cardiovascular and Mediastinum   Essential hypertension   Relevant Orders   CBC with Differential/Platelet   CMP14+EGFR   Lipid panel   Bayer DCA Hb A1c Waived   Microalbumin / creatinine urine ratio     Endocrine   T2DM (type 2 diabetes mellitus) (HCC) - Primary   Relevant Orders   CBC with Differential/Platelet   CMP14+EGFR   Lipid panel   Bayer DCA Hb A1c Waived   Microalbumin / creatinine urine ratio     Other   Hyperlipidemia LDL goal <130   Relevant Orders   CBC with Differential/Platelet   CMP14+EGFR   Lipid panel   Bayer DCA Hb A1c Waived   Microalbumin / creatinine urine ratio   ED (erectile dysfunction)    A1c was 7.0, focus on diet. Patient discussed Cialis or Viagra and we can prescribe those as long as cardiology says it is okay, he will contact his cardiologist and if they say he is okay to have it in regards to his heart then we can prescribe it. Follow up plan: Return in about 3 months (around 05/21/2023), or if symptoms worsen or fail to improve, for Diabetes and hypertension.  Counseling provided for all of the vaccine components Orders  Placed This Encounter  Procedures   CBC with Differential/Platelet   CMP14+EGFR   Lipid panel   Bayer DCA Hb A1c Waived   Microalbumin / creatinine urine ratio    Arville Care, MD [facility] 02/20/2023, 9:54 AM

## 2023-02-22 LAB — MICROALBUMIN / CREATININE URINE RATIO
Creatinine, Urine: 97.1 mg/dL
Microalb/Creat Ratio: 4 mg/g{creat} (ref 0–29)
Microalbumin, Urine: 4.2 ug/mL

## 2023-03-27 ENCOUNTER — Ambulatory Visit: Payer: Medicare Other

## 2023-03-27 DIAGNOSIS — I442 Atrioventricular block, complete: Secondary | ICD-10-CM | POA: Diagnosis not present

## 2023-03-28 LAB — CUP PACEART REMOTE DEVICE CHECK
Battery Remaining Longevity: 140 mo
Battery Voltage: 3.14 V
Brady Statistic AP VP Percent: 77.99 %
Brady Statistic AP VS Percent: 0.36 %
Brady Statistic AS VP Percent: 19.09 %
Brady Statistic AS VS Percent: 2.56 %
Brady Statistic RA Percent Paced: 79.22 %
Brady Statistic RV Percent Paced: 97.08 %
Date Time Interrogation Session: 20250108042932
Implantable Lead Connection Status: 753985
Implantable Lead Connection Status: 753985
Implantable Lead Implant Date: 20240709
Implantable Lead Implant Date: 20240709
Implantable Lead Location: 753859
Implantable Lead Location: 753860
Implantable Lead Model: 3830
Implantable Lead Model: 5076
Implantable Pulse Generator Implant Date: 20240709
Lead Channel Impedance Value: 285 Ohm
Lead Channel Impedance Value: 361 Ohm
Lead Channel Impedance Value: 399 Ohm
Lead Channel Impedance Value: 494 Ohm
Lead Channel Pacing Threshold Amplitude: 0.625 V
Lead Channel Pacing Threshold Amplitude: 0.75 V
Lead Channel Pacing Threshold Pulse Width: 0.4 ms
Lead Channel Pacing Threshold Pulse Width: 0.4 ms
Lead Channel Sensing Intrinsic Amplitude: 0.875 mV
Lead Channel Sensing Intrinsic Amplitude: 0.875 mV
Lead Channel Sensing Intrinsic Amplitude: 28.125 mV
Lead Channel Sensing Intrinsic Amplitude: 28.125 mV
Lead Channel Setting Pacing Amplitude: 1.5 V
Lead Channel Setting Pacing Amplitude: 2 V
Lead Channel Setting Pacing Pulse Width: 0.4 ms
Lead Channel Setting Sensing Sensitivity: 4 mV
Zone Setting Status: 755011

## 2023-04-26 ENCOUNTER — Other Ambulatory Visit: Payer: Self-pay | Admitting: Family Medicine

## 2023-04-26 DIAGNOSIS — K219 Gastro-esophageal reflux disease without esophagitis: Secondary | ICD-10-CM

## 2023-04-26 DIAGNOSIS — I10 Essential (primary) hypertension: Secondary | ICD-10-CM

## 2023-04-26 DIAGNOSIS — E785 Hyperlipidemia, unspecified: Secondary | ICD-10-CM

## 2023-05-10 NOTE — Addendum Note (Signed)
 Addended by: Elease Etienne A on: 05/10/2023 08:32 AM   Modules accepted: Orders

## 2023-05-10 NOTE — Progress Notes (Signed)
 Remote pacemaker transmission.

## 2023-05-23 ENCOUNTER — Ambulatory Visit: Payer: Medicare Other | Admitting: Family Medicine

## 2023-05-23 ENCOUNTER — Encounter: Payer: Self-pay | Admitting: Family Medicine

## 2023-05-23 VITALS — BP 139/87 | HR 87 | Ht 68.0 in | Wt 179.0 lb

## 2023-05-23 DIAGNOSIS — E785 Hyperlipidemia, unspecified: Secondary | ICD-10-CM

## 2023-05-23 DIAGNOSIS — Z23 Encounter for immunization: Secondary | ICD-10-CM

## 2023-05-23 DIAGNOSIS — I10 Essential (primary) hypertension: Secondary | ICD-10-CM

## 2023-05-23 DIAGNOSIS — N401 Enlarged prostate with lower urinary tract symptoms: Secondary | ICD-10-CM

## 2023-05-23 DIAGNOSIS — N529 Male erectile dysfunction, unspecified: Secondary | ICD-10-CM

## 2023-05-23 DIAGNOSIS — E119 Type 2 diabetes mellitus without complications: Secondary | ICD-10-CM | POA: Diagnosis not present

## 2023-05-23 DIAGNOSIS — K219 Gastro-esophageal reflux disease without esophagitis: Secondary | ICD-10-CM | POA: Diagnosis not present

## 2023-05-23 LAB — BAYER DCA HB A1C WAIVED: HB A1C (BAYER DCA - WAIVED): 7.3 % — ABNORMAL HIGH (ref 4.8–5.6)

## 2023-05-23 MED ORDER — ATORVASTATIN CALCIUM 40 MG PO TABS: 40.0000 mg | ORAL_TABLET | Freq: Every day | ORAL | 3 refills | Status: DC

## 2023-05-23 MED ORDER — LISINOPRIL 10 MG PO TABS: 10.0000 mg | ORAL_TABLET | Freq: Every day | ORAL | 3 refills | Status: AC

## 2023-05-23 MED ORDER — FINASTERIDE 5 MG PO TABS: 5.0000 mg | ORAL_TABLET | Freq: Every day | ORAL | 3 refills | Status: DC

## 2023-05-23 MED ORDER — OMEPRAZOLE 40 MG PO CPDR: 40.0000 mg | DELAYED_RELEASE_CAPSULE | Freq: Every day | ORAL | 3 refills | Status: AC

## 2023-05-23 MED ORDER — TADALAFIL 10 MG PO TABS: 10.0000 mg | ORAL_TABLET | Freq: Every day | ORAL | 1 refills | Status: DC | PRN

## 2023-05-23 NOTE — Addendum Note (Signed)
 Addended by: Arville Care on: 05/23/2023 10:17 AM   Modules accepted: Orders

## 2023-05-23 NOTE — Progress Notes (Signed)
 BP 139/87   Pulse 87   Ht 5\' 8"  (1.727 m)   Wt 179 lb (81.2 kg)   SpO2 100%   BMI 27.22 kg/m    Subjective:   Patient ID: Shawn Lambert, male    DOB: [birth year], 83 y.o.   MRN: [id number]  HPI: Shawn Lambert is a 83 y.o. male presenting on 05/23/2023 for Medical Management of Chronic Issues, Diabetes, Hyperlipidemia, and Hypertension   HPI Type 2 diabetes mellitus Patient comes in today for recheck of his diabetes. Patient has been currently taking no medication currently. Patient is currently on an ACE inhibitor/ARB. Patient has not seen an ophthalmologist this year. Patient denies any new issues with their feet. The symptom started onset as an adult hypertension and hyperlipidemia ARE RELATED TO DM   Hypertension Patient is currently on lisinopril and metoprolol, and their blood pressure today is 139/87. Patient denies any lightheadedness or dizziness. Patient denies headaches, blurred vision, chest pains, shortness of breath, or weakness. Denies any side effects from medication and is content with current medication.   Hyperlipidemia Patient is coming in for recheck of his hyperlipidemia. The patient is currently taking atorvastatin. They deny any issues with myalgias or history of liver damage from it. They deny any focal numbness or weakness or chest pain.   GERD Patient is currently on omeprazole.  She denies any major symptoms or abdominal pain or belching or burping. She denies any blood in her stool or lightheadedness or dizziness.   BPH Patient is coming in for recheck on BPH Symptoms: Erectile dysfunction and some decreased urinary stream Medication: Finasteride Last PSA: Over a year ago  Erectile dysfunction Patient has a new younger girlfriend and has erectile dysfunction and wants to try some medicine for it.  He has never tried any of it before we want him to talk with his cardiologist first to make sure was okay.  Relevant past medical, surgical, family and  social history reviewed and updated as indicated. Interim medical history since our last visit reviewed. Allergies and medications reviewed and updated.  Review of Systems  Constitutional:  Negative for chills and fever.  Eyes:  Negative for visual disturbance.  Respiratory:  Negative for shortness of breath and wheezing.   Cardiovascular:  Negative for chest pain and leg swelling.  Musculoskeletal:  Negative for back pain and gait problem.  Skin:  Negative for rash.  Neurological:  Negative for dizziness and light-headedness.  All other systems reviewed and are negative.   Per HPI unless specifically indicated above   Allergies as of 05/23/2023       Reactions   Penicillins Itching   Sulfonamide Derivatives Other (See Comments)   Reaction not known        Medication List        Accurate as of May 23, 2023 10:13 AM. If you have any questions, ask your nurse or doctor.          aspirin EC 81 MG tablet Take 1 tablet (81 mg total) by mouth daily.   atorvastatin 40 MG tablet Commonly known as: LIPITOR Take 1 tablet (40 mg total) by mouth daily.   CVS SPECTRAVITE ADULT 50+ PO Take 1 tablet by mouth daily. Reported on 08/01/2015   finasteride 5 MG tablet Commonly known as: Proscar Take 1 tablet (5 mg total) by mouth daily.   Glucosamine-Chondroitin-MSM 750-400-375 MG Tabs Take 1 tablet by mouth daily.   lisinopril 10 MG tablet Commonly known as: ZESTRIL Take  1 tablet (10 mg total) by mouth daily.   metoprolol succinate 25 MG 24 hr tablet Commonly known as: Toprol XL Take 1 tablet (25 mg total) by mouth daily.   omeprazole 40 MG capsule Commonly known as: PRILOSEC Take 1 capsule (40 mg total) by mouth daily.   vitamin A 40981 UNIT capsule Take 25,000 Units by mouth daily.         Objective:   BP 139/87   Pulse 87   Ht 5\' 8"  (1.727 m)   Wt 179 lb (81.2 kg)   SpO2 100%   BMI 27.22 kg/m   Wt Readings from Last 3 Encounters:  05/23/23 179 lb  (81.2 kg)  02/20/23 180 lb (81.6 kg)  02/04/23 180 lb (81.6 kg)    Physical Exam Vitals and nursing note reviewed.  Constitutional:      General: He is not in acute distress.    Appearance: He is well-developed. He is not diaphoretic.  Eyes:     General: No scleral icterus.    Conjunctiva/sclera: Conjunctivae normal.  Neck:     Thyroid: No thyromegaly.  Cardiovascular:     Rate and Rhythm: Normal rate and regular rhythm.     Heart sounds: Normal heart sounds. No murmur heard. Pulmonary:     Effort: Pulmonary effort is normal. No respiratory distress.     Breath sounds: Normal breath sounds. No wheezing.  Musculoskeletal:        General: No swelling.     Cervical back: Neck supple.  Lymphadenopathy:     Cervical: No cervical adenopathy.  Skin:    General: Skin is warm and dry.     Findings: No rash.  Neurological:     Mental Status: He is alert and oriented to person, place, and time.     Coordination: Coordination normal.  Psychiatric:        Behavior: Behavior normal.       Assessment & Plan:   Problem List Items Addressed This Visit       Cardiovascular and Mediastinum   Essential hypertension   Relevant Medications   atorvastatin (LIPITOR) 40 MG tablet   lisinopril (ZESTRIL) 10 MG tablet     Digestive   GERD   Relevant Medications   omeprazole (PRILOSEC) 40 MG capsule     Endocrine   T2DM (type 2 diabetes mellitus) (HCC) - Primary   Relevant Medications   atorvastatin (LIPITOR) 40 MG tablet   lisinopril (ZESTRIL) 10 MG tablet   Other Relevant Orders   Bayer DCA Hb A1c Waived     Other   Hyperlipidemia LDL goal <130   Relevant Medications   atorvastatin (LIPITOR) 40 MG tablet   lisinopril (ZESTRIL) 10 MG tablet   Benign prostatic hyperplasia with urinary frequency   Relevant Medications   finasteride (PROSCAR) 5 MG tablet   ED (erectile dysfunction)   Other Visit Diagnoses       Encounter for immunization       Relevant Orders   Flu  Vaccine Trivalent High Dose (Fluad) (Completed)       For blood pressure continue current medicine, seems to be doing well.  A1c is up a little bit at 7.3 but he wants to focus on diet for that.  Will give prescription for Cialis for erectile dysfunction.  The rest of his blood work for cholesterol and everything else looked good last time, will do blood work next time Follow up plan: Return in about 3 months (around 08/23/2023), or if  symptoms worsen or fail to improve, for Diabetes.  Counseling provided for all of the vaccine components Orders Placed This Encounter  Procedures   Flu Vaccine Trivalent High Dose (Fluad)   Bayer DCA Hb A1c Waived    Arville Care, MD [facility] 05/23/2023, 10:13 AM

## 2023-06-26 ENCOUNTER — Ambulatory Visit (INDEPENDENT_AMBULATORY_CARE_PROVIDER_SITE_OTHER): Payer: Medicare Other

## 2023-06-26 DIAGNOSIS — I442 Atrioventricular block, complete: Secondary | ICD-10-CM

## 2023-06-26 LAB — CUP PACEART REMOTE DEVICE CHECK
Battery Remaining Longevity: 138 mo
Battery Voltage: 3.08 V
Brady Statistic AP VP Percent: 63.72 %
Brady Statistic AP VS Percent: 0.69 %
Brady Statistic AS VP Percent: 30.86 %
Brady Statistic AS VS Percent: 4.73 %
Brady Statistic RA Percent Paced: 66.32 %
Brady Statistic RV Percent Paced: 94.58 %
Date Time Interrogation Session: 20250409004016
Implantable Lead Connection Status: 753985
Implantable Lead Connection Status: 753985
Implantable Lead Implant Date: 20240709
Implantable Lead Implant Date: 20240709
Implantable Lead Location: 753859
Implantable Lead Location: 753860
Implantable Lead Model: 3830
Implantable Lead Model: 5076
Implantable Pulse Generator Implant Date: 20240709
Lead Channel Impedance Value: 285 Ohm
Lead Channel Impedance Value: 380 Ohm
Lead Channel Impedance Value: 456 Ohm
Lead Channel Impedance Value: 513 Ohm
Lead Channel Pacing Threshold Amplitude: 0.875 V
Lead Channel Pacing Threshold Amplitude: 0.875 V
Lead Channel Pacing Threshold Pulse Width: 0.4 ms
Lead Channel Pacing Threshold Pulse Width: 0.4 ms
Lead Channel Sensing Intrinsic Amplitude: 1.125 mV
Lead Channel Sensing Intrinsic Amplitude: 1.125 mV
Lead Channel Sensing Intrinsic Amplitude: 26.375 mV
Lead Channel Sensing Intrinsic Amplitude: 26.375 mV
Lead Channel Setting Pacing Amplitude: 1.5 V
Lead Channel Setting Pacing Amplitude: 2 V
Lead Channel Setting Pacing Pulse Width: 0.4 ms
Lead Channel Setting Sensing Sensitivity: 4 mV
Zone Setting Status: 755011

## 2023-06-29 ENCOUNTER — Encounter: Payer: Self-pay | Admitting: Cardiology

## 2023-07-09 ENCOUNTER — Ambulatory Visit (INDEPENDENT_AMBULATORY_CARE_PROVIDER_SITE_OTHER): Payer: Medicare Other

## 2023-07-09 VITALS — BP 139/87 | HR 87 | Ht 68.0 in | Wt 179.0 lb

## 2023-07-09 DIAGNOSIS — Z0000 Encounter for general adult medical examination without abnormal findings: Secondary | ICD-10-CM

## 2023-07-09 DIAGNOSIS — Z789 Other specified health status: Secondary | ICD-10-CM | POA: Insufficient documentation

## 2023-07-09 DIAGNOSIS — Z1211 Encounter for screening for malignant neoplasm of colon: Secondary | ICD-10-CM

## 2023-07-09 NOTE — Progress Notes (Signed)
 Subjective:   Shawn Lambert is a 83 y.o. who presents for a Medicare Wellness preventive visit.  Visit Complete: Virtual I connected with  Shawn Lambert on 07/09/23 by a audio enabled telemedicine application and verified that I am speaking with the correct person using two identifiers.  Patient Location: Home  Provider Location: Home Office  I discussed the limitations of evaluation and management by telemedicine. The patient expressed understanding and agreed to proceed.  Vital Signs: Because this visit was a virtual/telehealth visit, some criteria may be missing or patient reported. Any vitals not documented were not able to be obtained and vitals that have been documented are patient reported.  VideoDeclined- This patient declined Librarian, academic. Therefore the visit was completed with audio only.  Persons Participating in Visit: Patient.  AWV Questionnaire: No: Patient Medicare AWV questionnaire was not completed prior to this visit.  Cardiac Risk Factors include: advanced age (>39men, >75 women);diabetes mellitus;male gender;dyslipidemia;hypertension     Objective:    Today's Vitals   07/09/23 1107  BP: 139/87  Pulse: 87  Weight: 179 lb (81.2 kg)  Height: 5\' 8"  (1.727 m)   Body mass index is 27.22 kg/m.     07/09/2023   10:48 AM 09/24/2022   12:01 PM 07/05/2022    9:30 AM 11/01/2020    9:07 AM 05/04/2018   10:20 PM 05/04/2018    5:08 PM 10/24/2017   10:40 AM  Advanced Directives  Does Patient Have a Medical Advance Directive? No No No No No No No  Would patient like information on creating a medical advance directive?  No - Patient declined No - Patient declined No - Patient declined No - Patient declined  No - Patient declined    Current Medications (verified) Outpatient Encounter Medications as of 07/09/2023  Medication Sig   aspirin  EC 81 MG tablet Take 1 tablet (81 mg total) by mouth daily.   atorvastatin  (LIPITOR) 40 MG tablet  Take 1 tablet (40 mg total) by mouth daily.   Beta Carotene (VITAMIN A) 25000 UNIT capsule Take 25,000 Units by mouth daily.   finasteride  (PROSCAR ) 5 MG tablet Take 1 tablet (5 mg total) by mouth daily.   Glucosamine-Chondroitin-MSM 750-400-375 MG TABS Take 1 tablet by mouth daily.    lisinopril  (ZESTRIL ) 10 MG tablet Take 1 tablet (10 mg total) by mouth daily.   metoprolol  succinate (TOPROL  XL) 25 MG 24 hr tablet Take 1 tablet (25 mg total) by mouth daily.   Multiple Vitamins-Minerals (CVS SPECTRAVITE ADULT 50+ PO) Take 1 tablet by mouth daily. Reported on 08/01/2015   omeprazole  (PRILOSEC) 40 MG capsule Take 1 capsule (40 mg total) by mouth daily.   tadalafil  (CIALIS ) 10 MG tablet Take 1 tablet (10 mg total) by mouth daily as needed for erectile dysfunction.   [DISCONTINUED] atorvastatin  (LIPITOR) 40 MG tablet Take 1 tablet (40 mg total) by mouth daily. (Patient taking differently: Take 40 mg by mouth. Take 2 tablets by mouth daily)   [DISCONTINUED] finasteride  (PROSCAR ) 5 MG tablet Take 1 tablet (5 mg total) by mouth daily.   [DISCONTINUED] lisinopril  (ZESTRIL ) 10 MG tablet Take 1 tablet (10 mg total) by mouth daily.   [DISCONTINUED] omeprazole  (PRILOSEC) 40 MG capsule Take 1 capsule (40 mg total) by mouth daily.   [DISCONTINUED] Betamethasone  Sod Phos & Acet 30 MG/5ML KIT 2 mL    No facility-administered encounter medications on file as of 07/09/2023.    Allergies (verified) Penicillins and Sulfonamide derivatives  History: Past Medical History:  Diagnosis Date   Anginal pain (HCC)    Arthritis    Cancer (HCC)    basil cell skin cancer   Dysrhythmia    pt. states physician said " he has a irregular heart beat"   GERD (gastroesophageal reflux disease)    Hyperlipidemia    Hypertension    Kidney stones    Past Surgical History:  Procedure Laterality Date   ARTERIAL LINE INSERTION Right 09/24/2022   Procedure: ARTERIAL LINE INSERTION;  Surgeon: Kyra Phy, MD;  Location: [facility]  INVASIVE CV LAB;  Service: Cardiovascular;  Laterality: Right;  right radial   EYE SURGERY Right 1965   Prosthetic Eye   LEG SURGERY Left 12/18/2016   fractured femur   PACEMAKER IMPLANT N/A 09/25/2022   Procedure: PACEMAKER IMPLANT;  Surgeon: Boyce Byes, MD;  Location: [facility] INVASIVE CV LAB;  Service: Cardiovascular;  Laterality: N/A;   TEMPORARY PACEMAKER N/A 09/24/2022   Procedure: TEMPORARY PACEMAKER;  Surgeon: Kyra Phy, MD;  Location: [facility] INVASIVE CV LAB;  Service: Cardiovascular;  Laterality: N/A;   TOTAL KNEE ARTHROPLASTY     TOTAL KNEE ARTHROPLASTY Right 07/26/2015   Procedure: RIGHT TOTAL KNEE ARTHROPLASTY;  Surgeon: Claiborne Crew, MD;  Location: [facility] ORS;  Service: Orthopedics;  Laterality: Right;   Family History  Problem Relation Age of Onset   Congestive Heart Failure Father    Congestive Heart Failure Mother    AAA (abdominal aortic aneurysm) Mother    COPD Mother    Macular degeneration Mother    Heart attack Brother 17   Arthritis Brother    Multiple sclerosis Son    Social History   Socioeconomic History   Marital status: Widowed    Spouse name: Not on file   Number of children: 2   Years of education: 14   Highest education level: Associate degree: occupational, Scientist, product/process development, or vocational program  Occupational History   Occupation: Retired    Comment: Barrister's clerk  Tobacco Use   Smoking status: Never   Smokeless tobacco: Never  Vaping Use   Vaping status: Never Used  Substance and Sexual Activity   Alcohol use: No   Drug use: No   Sexual activity: Yes  Other Topics Concern   Not on file  Social History Narrative   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   Social Drivers of Health   Financial Resource Strain: Low Risk  (07/09/2023)   Overall Financial Resource Strain (CARDIA)    Difficulty of Paying Living Expenses: Not hard at all  Food  Insecurity: No Food Insecurity (07/09/2023)   Hunger Vital Sign    Worried About Running Out of Food in the Last Year: Never true    Ran Out of Food in the Last Year: Never true  Transportation Needs: No Transportation Needs (07/09/2023)   PRAPARE - Administrator, Civil Service (Medical): No    Lack of Transportation (Non-Medical): No  Physical Activity: Insufficiently Active (07/09/2023)   Exercise Vital Sign    Days of Exercise per Week: 1 day    Minutes of Exercise per Session: 60 min  Stress: No Stress Concern Present (07/09/2023)   [employer] of Occupational Health - Occupational Stress Questionnaire    Feeling of Stress : Not at all  Social Connections: Moderately Isolated (07/09/2023)   Social Connection and Isolation  Panel [NHANES]    Frequency of Communication with Friends and Family: More than three times a week    Frequency of Social Gatherings with Friends and Family: More than three times a week    Attends Religious Services: 1 to 4 times per year    Active Member of [employer] or Organizations: No    Attends Banker Meetings: Never    Marital Status: Widowed    Tobacco Counseling Counseling given: Yes    Clinical Intake:  Pre-visit preparation completed: Yes  Pain : No/denies pain     BMI - recorded: 27.22 Nutritional Status: BMI 25 -29 Overweight Nutritional Risks: None Diabetes: Yes CBG done?: No  Lab Results  Component Value Date   HGBA1C 7.3 (H) 05/23/2023   HGBA1C 7.0 (H) 02/20/2023   HGBA1C 6.8 (H) 11/15/2022     How often do you need to have someone help you when you read instructions, pamphlets, or other written materials from your doctor or pharmacy?: 1 - Never  Interpreter Needed?: No  Information entered by :: Alia T/cma   Activities of Daily Living     07/09/2023   10:46 AM 09/24/2022    4:10 PM  In your present state of health, do you have any difficulty performing the following activities:  Hearing? 0    Vision? 0   Difficulty concentrating or making decisions? 0   Walking or climbing stairs? 0   Dressing or bathing? 0   Doing errands, shopping? 0 0  Preparing Food and eating ? N   Using the Toilet? N   In the past six months, have you accidently leaked urine? N   Do you have problems with loss of bowel control? N   Managing your Medications? N   Managing your Finances? N   Housekeeping or managing your Housekeeping? N     Patient Care Team: Dettinger, Lucio Sabin, MD as PCP - General (Family Medicine) Boyce Byes, MD as PCP - Electrophysiology (Cardiology) Delilah Fend, [facility] (Pharmacist) Winston Hawking, MD as Consulting Physician (Orthopedic Surgery) [place] Atta Scot Cutter, MD as Consulting Physician (Ophthalmology) Lorry Rouge, MD as Referring Physician (Dermatology)  Indicate any recent Medical Services you may have received from other than [facility] providers in the past year (date may be approximate).     Assessment:   This is a routine wellness examination for Adams.  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 In [place], [place]   Goals Addressed             This Visit's Progress    Exercise 150 min/wk Moderate Activity   On track      Depression Screen     07/09/2023   11:13 AM 05/23/2023    9:41 AM 02/20/2023    9:07 AM 11/15/2022   10:04 AM 09/24/2022    9:09 AM 08/15/2022    9:49 AM 07/05/2022    9:29 AM  PHQ 2/9 Scores  PHQ - 2 Score 0 0 0 0 0 0 0  PHQ- 9 Score   0 0 6 0     Fall Risk     07/09/2023   10:49 AM 05/23/2023    9:41 AM 02/20/2023    9:07 AM 11/15/2022   10:04 AM 09/24/2022    9:09 AM  Fall Risk   Falls  in the past year? 0 0 0 0 0  Number falls in past yr: 0      Injury with Fall? 0      Risk for fall due to : No Fall Risks      Follow up Falls prevention discussed;Falls  evaluation completed        MEDICARE RISK AT HOME:  Medicare Risk at Home Any stairs in or around the home?: No If so, are there any without handrails?: No Home free of loose throw rugs in walkways, pet beds, electrical cords, etc?: Yes Adequate lighting in your home to reduce risk of falls?: Yes Life alert?: No Use of a cane, walker or w/c?: No Grab bars in the bathroom?: Yes Shower chair or bench in shower?: Yes Elevated toilet seat or a handicapped toilet?: No  TIMED UP AND GO:  Was the test performed?  No  Cognitive Function: 6CIT completed    10/24/2017   10:47 AM  MMSE - Mini Mental State Exam  Orientation to time 5  Orientation to Place 5  Registration 3  Attention/ Calculation 5  Recall 1  Language- name 2 objects 2  Language- repeat 1  Language- follow 3 step command 3  Language- read & follow direction 1  Write a sentence 1  Copy design 1  Total score 28        07/09/2023   10:55 AM 07/05/2022    9:31 AM 11/01/2020    9:07 AM  6CIT Screen  What Year? 0 points 0 points 0 points  What month? 0 points 0 points 0 points  What time? 0 points 0 points 0 points  Count back from 20 2 points 0 points 0 points  Months in reverse 4 points 0 points 0 points  Repeat phrase 2 points 0 points 2 points  Total Score 8 points 0 points 2 points    Immunizations Immunization History  Administered Date(s) Administered   Fluad Quad(high Dose 65+) 01/26/2021   Fluad Trivalent(High Dose 65+) 05/23/2023   Influenza, High Dose Seasonal PF 12/08/2013, 12/30/2015, 12/27/2016, 01/10/2018   Influenza,inj,Quad PF,6+ Mos 12/08/2012, 02/28/2015   Influenza-Unspecified 03/06/2019, 01/19/2020, 12/01/2021   Moderna Sars-Covid-2 Vaccination 04/22/2019, 05/20/2019, 03/17/2020   Pneumococcal Conjugate-13 02/28/2015   Pneumococcal Polysaccharide-23 11/08/2016   Tdap 02/18/2020   Zoster Recombinant(Shingrix) 10/24/2020, 04/20/2021   Zoster, Live 06/25/2013    Screening Tests Health  Maintenance  Topic Date Due   OPHTHALMOLOGY EXAM  08/29/2022   COVID-19 Vaccine (4 - 2024-25 season) 07/24/2024 (Originally 11/18/2022)   FOOT EXAM  08/15/2023   INFLUENZA VACCINE  10/18/2023   HEMOGLOBIN A1C  11/23/2023   Diabetic kidney evaluation - eGFR measurement  02/20/2024   Diabetic kidney evaluation - Urine ACR  02/20/2024   Medicare Annual Wellness (AWV)  07/08/2024   DTaP/Tdap/Td (2 - Td or Tdap) 02/17/2030   Pneumonia Vaccine 72+ Years old  Completed   Zoster Vaccines- Shingrix  Completed   HPV VACCINES  Aged Out   Meningococcal B Vaccine  Aged Out    Health Maintenance  Health Maintenance Due  Topic Date Due   OPHTHALMOLOGY EXAM  08/29/2022   Health Maintenance Items Addressed: Cologuard Ordered  Additional Screening:  Vision Screening: Recommended annual ophthalmology exams for early detection of glaucoma and other disorders of the eye.  Dental Screening: Recommended annual dental exams for proper oral hygiene  Community Resource Referral / Chronic Care Management: CRR required this visit?  No   CCM required this visit?  No  Plan:     I have personally reviewed and noted the following in the patient's chart:   Medical and social history Use of alcohol, tobacco or illicit drugs  Current medications and supplements including opioid prescriptions. Patient is not currently taking opioid prescriptions. Functional ability and status Nutritional status Physical activity Advanced directives List of other physicians Hospitalizations, surgeries, and ER visits in previous 12 months Vitals Screenings to include cognitive, depression, and falls Referrals and appointments  In addition, I have reviewed and discussed with patient certain preventive protocols, quality metrics, and best practice recommendations. A written personalized care plan for preventive services as well as general preventive health recommendations were provided to patient.     Michaelle Adolphus, CMA   07/09/2023   After Visit Summary: (MyChart) Due to this being a telephonic visit, the after visit summary with patients personalized plan was offered to patient via MyChart   Notes:  cologuard per pt request

## 2023-07-09 NOTE — Patient Instructions (Signed)
 Shawn Lambert , Thank you for taking time to come for your Medicare Wellness Visit. I appreciate your ongoing commitment to your health goals. Please review the following plan we discussed and let me know if I can assist you in the future.   Referrals/Orders/Follow-Ups/Clinician Recommendations: Please remember to make your annual eye appointment soon.  This is a list of the screening recommended for you and due dates:  Health Maintenance  Topic Date Due   Eye exam for diabetics  08/29/2022   COVID-19 Vaccine (4 - 2024-25 season) 07/24/2024*   Complete foot exam   08/15/2023   Flu Shot  10/18/2023   Hemoglobin A1C  11/23/2023   Yearly kidney function blood test for diabetes  02/20/2024   Yearly kidney health urinalysis for diabetes  02/20/2024   Medicare Annual Wellness Visit  07/08/2024   DTaP/Tdap/Td vaccine (2 - Td or Tdap) 02/17/2030   Pneumonia Vaccine  Completed   Zoster (Shingles) Vaccine  Completed   HPV Vaccine  Aged Out   Meningitis B Vaccine  Aged Out  *Topic was postponed. The date shown is not the original due date.    Advanced directives: (Declined) Advance directive discussed with you today. Even though you declined this today, please call our office should you change your mind, and we can give you the proper paperwork for you to fill out.  Next Medicare Annual Wellness Visit scheduled for next year: Yes

## 2023-07-14 DIAGNOSIS — Z1211 Encounter for screening for malignant neoplasm of colon: Secondary | ICD-10-CM | POA: Diagnosis not present

## 2023-07-19 LAB — COLOGUARD: COLOGUARD: POSITIVE — AB

## 2023-07-22 ENCOUNTER — Encounter: Payer: Self-pay | Admitting: Family Medicine

## 2023-07-22 ENCOUNTER — Other Ambulatory Visit: Payer: Self-pay

## 2023-07-22 DIAGNOSIS — Z1211 Encounter for screening for malignant neoplasm of colon: Secondary | ICD-10-CM

## 2023-07-30 DIAGNOSIS — L570 Actinic keratosis: Secondary | ICD-10-CM | POA: Diagnosis not present

## 2023-08-09 NOTE — Progress Notes (Signed)
 Remote pacemaker transmission.

## 2023-08-19 ENCOUNTER — Encounter (INDEPENDENT_AMBULATORY_CARE_PROVIDER_SITE_OTHER): Payer: Self-pay | Admitting: *Deleted

## 2023-08-23 ENCOUNTER — Ambulatory Visit: Admitting: Family Medicine

## 2023-08-23 ENCOUNTER — Encounter: Payer: Self-pay | Admitting: Family Medicine

## 2023-08-23 VITALS — BP 134/74 | HR 85 | Temp 97.0°F | Ht 68.0 in | Wt 178.0 lb

## 2023-08-23 DIAGNOSIS — E119 Type 2 diabetes mellitus without complications: Secondary | ICD-10-CM | POA: Diagnosis not present

## 2023-08-23 DIAGNOSIS — N401 Enlarged prostate with lower urinary tract symptoms: Secondary | ICD-10-CM

## 2023-08-23 DIAGNOSIS — R350 Frequency of micturition: Secondary | ICD-10-CM

## 2023-08-23 DIAGNOSIS — I10 Essential (primary) hypertension: Secondary | ICD-10-CM | POA: Diagnosis not present

## 2023-08-23 DIAGNOSIS — E1159 Type 2 diabetes mellitus with other circulatory complications: Secondary | ICD-10-CM | POA: Diagnosis not present

## 2023-08-23 DIAGNOSIS — I152 Hypertension secondary to endocrine disorders: Secondary | ICD-10-CM

## 2023-08-23 DIAGNOSIS — E785 Hyperlipidemia, unspecified: Secondary | ICD-10-CM

## 2023-08-23 DIAGNOSIS — M19011 Primary osteoarthritis, right shoulder: Secondary | ICD-10-CM | POA: Diagnosis not present

## 2023-08-23 LAB — LIPID PANEL

## 2023-08-23 LAB — BAYER DCA HB A1C WAIVED: HB A1C (BAYER DCA - WAIVED): 7.3 % — ABNORMAL HIGH (ref 4.8–5.6)

## 2023-08-23 MED ORDER — METHYLPREDNISOLONE ACETATE 80 MG/ML IJ SUSP
80.0000 mg | Freq: Once | INTRAMUSCULAR | Status: AC
  Administered 2023-08-23: 80 mg via INTRA_ARTICULAR

## 2023-08-23 NOTE — Progress Notes (Addendum)
 BP 134/74   Pulse 85   Temp (!) 97 F (36.1 C)   Ht 5' 8 (1.727 m)   Wt 178 lb (80.7 kg)   SpO2 96%   BMI 27.06 kg/m    Subjective:   Patient ID: Shawn Lambert, male    DOB: 1940/10/05, 83 y.o.   MRN: [id number]  HPI: Shawn Lambert is a 83 y.o. male presenting on 08/23/2023 for Medical Management of Chronic Issues, Diabetes, Hypertension, and Hypothyroidism   HPI Type 2 diabetes mellitus Patient comes in today for recheck of his diabetes. Patient has been currently taking no medicine currently, diet control. Patient is currently on an ACE inhibitor/ARB. Patient has not seen an ophthalmologist this year. Patient denies any new issues with their feet. The symptom started onset as an adult hypertension and hyperlipidemia ARE RELATED TO DM   Hypertension Patient is currently on lisinopril , and their blood pressure today is 134/74. Patient denies any lightheadedness or dizziness. Patient denies headaches, blurred vision, chest pains, shortness of breath, or weakness. Denies any side effects from medication and is content with current medication.  Patient said he never had the metoprolol   Hyperlipidemia Patient is coming in for recheck of his hyperlipidemia. The patient is currently taking atorvastatin . They deny any issues with myalgias or history of liver damage from it. They deny any focal numbness or weakness or chest pain.   Patient has had continued right shoulder pain and difficulty raising his arm above his head and hurts when he needs working especially in the yard and doing other stuff.  He would like to try an injection.  It has been hurting for quite some time, at least a few months.  He has used some topical agents and Tylenol .  BPH Patient is coming in for recheck on BPH Symptoms: Urinary frequency Medication: None currently Last PSA: Just over a year ago  Relevant past medical, surgical, family and social history reviewed and updated as indicated. Interim medical  history since our last visit reviewed. Allergies and medications reviewed and updated.  Review of Systems  Constitutional:  Negative for chills and fever.  Eyes:  Negative for visual disturbance.  Respiratory:  Negative for shortness of breath and wheezing.   Cardiovascular:  Negative for chest pain and leg swelling.  Genitourinary:  Positive for frequency.  Musculoskeletal:  Negative for back pain and gait problem.  Skin:  Negative for rash.  Neurological:  Negative for dizziness, weakness and numbness.  All other systems reviewed and are negative.   Per HPI unless specifically indicated above   Allergies as of 08/23/2023       Reactions   Penicillins Itching   Sulfonamide Derivatives Other (See Comments)   Reaction not known        Medication List        Accurate as of August 23, 2023 10:23 AM. If you have any questions, ask your nurse or doctor.          STOP taking these medications    finasteride  5 MG tablet Commonly known as: Proscar  Stopped by: Lucio Sabin Raneem Mendolia   metoprolol  succinate 25 MG 24 hr tablet Commonly known as: Toprol  XL Stopped by: Lucio Sabin Shloima Clinch       TAKE these medications    aspirin  EC 81 MG tablet Take 1 tablet (81 mg total) by mouth daily.   atorvastatin  40 MG tablet Commonly known as: LIPITOR Take 1 tablet (40 mg total) by mouth daily.   [facility]  SPECTRAVITE ADULT 50+ PO Take 1 tablet by mouth daily. Reported on 08/01/2015   Glucosamine-Chondroitin-MSM 750-400-375 MG Tabs Take 1 tablet by mouth daily.   lisinopril  10 MG tablet Commonly known as: ZESTRIL  Take 1 tablet (10 mg total) by mouth daily.   omeprazole  40 MG capsule Commonly known as: PRILOSEC Take 1 capsule (40 mg total) by mouth daily.   tadalafil  10 MG tablet Commonly known as: CIALIS  Take 1 tablet (10 mg total) by mouth daily as needed for erectile dysfunction.   vitamin A 25000 UNIT capsule Take 25,000 Units by mouth daily.         Objective:   BP  134/74   Pulse 85   Temp (!) 97 F (36.1 C)   Ht 5' 8 (1.727 m)   Wt 178 lb (80.7 kg)   SpO2 96%   BMI 27.06 kg/m   Wt Readings from Last 3 Encounters:  08/23/23 178 lb (80.7 kg)  07/09/23 179 lb (81.2 kg)  05/23/23 179 lb (81.2 kg)    Physical Exam Vitals and nursing note reviewed.  Constitutional:      General: He is not in acute distress.    Appearance: He is well-developed. He is not diaphoretic.  Eyes:     General: No scleral icterus.       Right eye: No discharge.     Conjunctiva/sclera: Conjunctivae normal.     Pupils: Pupils are equal, round, and reactive to light.  Neck:     Thyroid : No thyromegaly.  Cardiovascular:     Rate and Rhythm: Normal rate and regular rhythm.     Heart sounds: Normal heart sounds. No murmur heard. Pulmonary:     Effort: Pulmonary effort is normal. No respiratory distress.     Breath sounds: Normal breath sounds. No wheezing.  Musculoskeletal:     Right shoulder: Tenderness and crepitus present. No bony tenderness. Decreased range of motion. Normal strength.     Cervical back: Neck supple.  Lymphadenopathy:     Cervical: No cervical adenopathy.  Skin:    General: Skin is warm and dry.     Findings: No rash.  Neurological:     Mental Status: He is alert and oriented to person, place, and time.     Coordination: Coordination normal.  Psychiatric:        Behavior: Behavior normal.     Right subacromial injection: Consent form signed. Risk factors of bleeding and infection discussed with patient and patient is agreeable towards injection. Patient prepped with Betadine.  Posterior approach towards injection used. Injected 80 mg of Depo-Medrol  and 1 mL of 2% lidocaine . Patient tolerated procedure well and no side effects from noted. Minimal to no bleeding. Simple bandage applied after.   Assessment & Plan:   Problem List Items Addressed This Visit       Cardiovascular and Mediastinum   Hypertension associated with diabetes (HCC)    Relevant Orders   CBC with Differential/Platelet (Completed)   CMP14+EGFR (Completed)   Lipid panel (Completed)     Endocrine   T2DM (type 2 diabetes mellitus) (HCC) - Primary   Relevant Orders   Bayer DCA Hb A1c Waived (Completed)   CBC with Differential/Platelet (Completed)     Other   Hyperlipidemia LDL goal <130   Relevant Orders   CBC with Differential/Platelet (Completed)   CMP14+EGFR (Completed)   Lipid panel (Completed)   Benign prostatic hyperplasia with urinary frequency   Relevant Orders   CBC with Differential/Platelet (Completed)   PSA,  total and free (Completed)   Other Visit Diagnoses       Primary osteoarthritis of right shoulder       Relevant Medications   methylPREDNISolone  acetate (DEPO-MEDROL ) injection 80 mg (Completed)     Will check blood work today on the way out.  Follow up plan: Return in about 3 months (around 11/23/2023), or if symptoms worsen or fail to improve, for Diabetes recheck.  Counseling provided for all of the vaccine components Orders Placed This Encounter  Procedures   Bayer DCA Hb A1c Waived   CBC with Differential/Platelet   CMP14+EGFR   Lipid panel   PSA, total and free    Jolyne Needs, MD [facility] 08/23/2023, 10:23 AM

## 2023-08-24 LAB — CBC WITH DIFFERENTIAL/PLATELET
Basophils Absolute: 0 10*3/uL (ref 0.0–0.2)
Basos: 0 %
EOS (ABSOLUTE): 0.2 10*3/uL (ref 0.0–0.4)
Eos: 4 %
Hematocrit: 37 % — ABNORMAL LOW (ref 37.5–51.0)
Hemoglobin: 11.7 g/dL — ABNORMAL LOW (ref 13.0–17.7)
Immature Grans (Abs): 0 10*3/uL (ref 0.0–0.1)
Immature Granulocytes: 0 %
Lymphocytes Absolute: 0.8 10*3/uL (ref 0.7–3.1)
Lymphs: 20 %
MCH: 30.6 pg (ref 26.6–33.0)
MCHC: 31.6 g/dL (ref 31.5–35.7)
MCV: 97 fL (ref 79–97)
Monocytes Absolute: 0.5 10*3/uL (ref 0.1–0.9)
Monocytes: 12 %
Neutrophils Absolute: 2.6 10*3/uL (ref 1.4–7.0)
Neutrophils: 64 %
Platelets: 177 10*3/uL (ref 150–450)
RBC: 3.82 x10E6/uL — ABNORMAL LOW (ref 4.14–5.80)
RDW: 13 % (ref 11.6–15.4)
WBC: 4 10*3/uL (ref 3.4–10.8)

## 2023-08-24 LAB — CMP14+EGFR
ALT: 15 IU/L (ref 0–44)
AST: 16 IU/L (ref 0–40)
Albumin: 3.9 g/dL (ref 3.7–4.7)
Alkaline Phosphatase: 91 IU/L (ref 44–121)
BUN/Creatinine Ratio: 19 (ref 10–24)
BUN: 21 mg/dL (ref 8–27)
Bilirubin Total: 0.5 mg/dL (ref 0.0–1.2)
CO2: 21 mmol/L (ref 20–29)
Calcium: 9.1 mg/dL (ref 8.6–10.2)
Chloride: 108 mmol/L — ABNORMAL HIGH (ref 96–106)
Creatinine, Ser: 1.1 mg/dL (ref 0.76–1.27)
Globulin, Total: 2.2 g/dL (ref 1.5–4.5)
Glucose: 134 mg/dL — ABNORMAL HIGH (ref 70–99)
Potassium: 4.1 mmol/L (ref 3.5–5.2)
Sodium: 141 mmol/L (ref 134–144)
Total Protein: 6.1 g/dL (ref 6.0–8.5)
eGFR: 67 mL/min/{1.73_m2} (ref >59)

## 2023-08-24 LAB — PSA, TOTAL AND FREE
PSA, Free Pct: 26.1 %
PSA, Free: 0.47 ng/mL
Prostate Specific Ag, Serum: 1.8 ng/mL (ref 0.0–4.0)

## 2023-08-24 LAB — LIPID PANEL
Chol/HDL Ratio: 3 ratio (ref 0.0–5.0)
Cholesterol, Total: 143 mg/dL (ref 100–199)
HDL: 48 mg/dL (ref >39)
LDL Chol Calc (NIH): 85 mg/dL (ref 0–99)
Triglycerides: 47 mg/dL (ref 0–149)
VLDL Cholesterol Cal: 10 mg/dL (ref 5–40)

## 2023-08-29 ENCOUNTER — Ambulatory Visit: Payer: Self-pay | Admitting: Family Medicine

## 2023-09-07 ENCOUNTER — Other Ambulatory Visit: Payer: Self-pay | Admitting: Family Medicine

## 2023-09-07 DIAGNOSIS — N529 Male erectile dysfunction, unspecified: Secondary | ICD-10-CM

## 2023-09-25 ENCOUNTER — Ambulatory Visit: Payer: Medicare Other

## 2023-09-25 DIAGNOSIS — I442 Atrioventricular block, complete: Secondary | ICD-10-CM

## 2023-09-26 LAB — CUP PACEART REMOTE DEVICE CHECK
Battery Remaining Longevity: 136 mo
Battery Voltage: 3.05 V
Brady Statistic AP VP Percent: 57.2 %
Brady Statistic AP VS Percent: 0.26 %
Brady Statistic AS VP Percent: 41.11 %
Brady Statistic AS VS Percent: 1.43 %
Brady Statistic RA Percent Paced: 57.78 %
Brady Statistic RV Percent Paced: 98.31 %
Date Time Interrogation Session: 20250704140919
Implantable Lead Connection Status: 753985
Implantable Lead Connection Status: 753985
Implantable Lead Implant Date: 20240709
Implantable Lead Implant Date: 20240709
Implantable Lead Location: 753859
Implantable Lead Location: 753860
Implantable Lead Model: 3830
Implantable Lead Model: 5076
Implantable Pulse Generator Implant Date: 20240709
Lead Channel Impedance Value: 285 Ohm
Lead Channel Impedance Value: 380 Ohm
Lead Channel Impedance Value: 475 Ohm
Lead Channel Impedance Value: 513 Ohm
Lead Channel Pacing Threshold Amplitude: 0.875 V
Lead Channel Pacing Threshold Amplitude: 1 V
Lead Channel Pacing Threshold Pulse Width: 0.4 ms
Lead Channel Pacing Threshold Pulse Width: 0.4 ms
Lead Channel Sensing Intrinsic Amplitude: 1 mV
Lead Channel Sensing Intrinsic Amplitude: 1 mV
Lead Channel Sensing Intrinsic Amplitude: 31.5 mV
Lead Channel Sensing Intrinsic Amplitude: 31.5 mV
Lead Channel Setting Pacing Amplitude: 1.5 V
Lead Channel Setting Pacing Amplitude: 2 V
Lead Channel Setting Pacing Pulse Width: 0.4 ms
Lead Channel Setting Sensing Sensitivity: 4 mV
Zone Setting Status: 755011

## 2023-09-28 ENCOUNTER — Ambulatory Visit: Payer: Self-pay | Admitting: Cardiology

## 2023-11-28 ENCOUNTER — Encounter: Payer: Self-pay | Admitting: Family Medicine

## 2023-11-28 ENCOUNTER — Ambulatory Visit: Admitting: Family Medicine

## 2023-11-28 DIAGNOSIS — E119 Type 2 diabetes mellitus without complications: Secondary | ICD-10-CM

## 2023-11-29 ENCOUNTER — Telehealth: Payer: Self-pay | Admitting: Family Medicine

## 2023-11-29 NOTE — Telephone Encounter (Signed)
 Copied from CRM #[id number]. Topic: Clinical - Medical Advice >> Nov 29, 2023  3:30 PM DeAngela L wrote: Reason for CRM: patient fell a few days ago and he says he is ok but his ribs are a little sore he says it does not hurt any where like when he has had broken ribs before I informed him I will let Dr Dettinger know he has tripped he was ok with me doing so  Pt num [phone] (W)

## 2023-11-29 NOTE — Telephone Encounter (Signed)
 Pt denies concussion symptoms. States that he tripped over something and hit his face and ribs. He thought he broke his nose but he did not. He has broken ribs before and feels it is the same pain. He does not have any trouble breathing. Pt aware to make an appt if anything changes.

## 2023-12-23 ENCOUNTER — Ambulatory Visit (INDEPENDENT_AMBULATORY_CARE_PROVIDER_SITE_OTHER): Admitting: Family Medicine

## 2023-12-23 ENCOUNTER — Encounter: Payer: Self-pay | Admitting: Family Medicine

## 2023-12-23 VITALS — BP 138/84 | Ht 68.0 in | Wt 178.0 lb

## 2023-12-23 DIAGNOSIS — N529 Male erectile dysfunction, unspecified: Secondary | ICD-10-CM | POA: Diagnosis not present

## 2023-12-23 DIAGNOSIS — E1159 Type 2 diabetes mellitus with other circulatory complications: Secondary | ICD-10-CM | POA: Diagnosis not present

## 2023-12-23 DIAGNOSIS — Z9181 History of falling: Secondary | ICD-10-CM | POA: Diagnosis not present

## 2023-12-23 DIAGNOSIS — E119 Type 2 diabetes mellitus without complications: Secondary | ICD-10-CM | POA: Diagnosis not present

## 2023-12-23 DIAGNOSIS — E785 Hyperlipidemia, unspecified: Secondary | ICD-10-CM | POA: Diagnosis not present

## 2023-12-23 DIAGNOSIS — I152 Hypertension secondary to endocrine disorders: Secondary | ICD-10-CM

## 2023-12-23 LAB — BAYER DCA HB A1C WAIVED: HB A1C (BAYER DCA - WAIVED): 7 % — ABNORMAL HIGH (ref 4.8–5.6)

## 2023-12-23 MED ORDER — TADALAFIL 10 MG PO TABS: 10.0000 mg | ORAL_TABLET | Freq: Every day | ORAL | 1 refills | Status: AC | PRN

## 2023-12-23 NOTE — Progress Notes (Signed)
 BP 138/84   Ht 5' 8 (1.727 m)   Wt 178 lb (80.7 kg)   SpO2 97%   BMI 27.06 kg/m    Subjective:   Patient ID: Shawn Lambert, male    DOB: 10/24/1940, 83 y.o.   MRN: [id number]  HPI: Shawn Lambert is a 83 y.o. male presenting on 12/23/2023 for Medical Management of Chronic Issues and Diabetes   Discussed the use of AI scribe software for clinical note transcription with the patient, who gave verbal consent to proceed.  History of Present Illness   Shawn Lambert is an 83 year old male with diabetes and hypertension who presents for a follow-up visit regarding diabetes management.  Glycemic control and dietary habits - Diabetes mellitus with recent A1c of 7.0, improved from 7.3 previously - Blood glucose monitoring is inconsistent; did not check levels this morning - Dietary management is challenging due to frequent dining out - Consumes salads and microwave meals regularly - High intake of sugar-free sodas - Uses stevia as a sweetener in tea  Gastroesophageal reflux symptoms - Chronic reflux symptoms despite long-term omeprazole  use - Symptoms persist regardless of dietary modifications - Increased mucus production associated with reflux  Fall and associated injuries - Recent fall at [facility] after tripping over a speed bump - Sustained epistaxis and chest pain following the fall - Assisted by employees at the scene - No serious injury sustained  Medication adherence - Continues to take cholesterol-lowering medication as prescribed          Relevant past medical, surgical, family and social history reviewed and updated as indicated. Interim medical history since our last visit reviewed. Allergies and medications reviewed and updated.  Review of Systems  Constitutional:  Negative for chills and fever.  Eyes:  Negative for visual disturbance.  Respiratory:  Negative for shortness of breath and wheezing.   Cardiovascular:  Negative for chest pain and leg swelling.   Musculoskeletal:  Negative for back pain and gait problem.  Skin:  Negative for rash.  Neurological:  Negative for dizziness and light-headedness.  All other systems reviewed and are negative.   Per HPI unless specifically indicated above   Allergies as of 12/23/2023       Reactions   Penicillins Itching   Sulfonamide Derivatives Other (See Comments)   Reaction not known        Medication List        Accurate as of December 23, 2023 11:14 AM. If you have any questions, ask your nurse or doctor.          aspirin  EC 81 MG tablet Take 1 tablet (81 mg total) by mouth daily.   atorvastatin  40 MG tablet Commonly known as: LIPITOR Take 1 tablet (40 mg total) by mouth daily.   [facility] SPECTRAVITE ADULT 50+ PO Take 1 tablet by mouth daily. Reported on 08/01/2015   Glucosamine-Chondroitin-MSM 750-400-375 MG Tabs Take 1 tablet by mouth daily.   lisinopril  10 MG tablet Commonly known as: ZESTRIL  Take 1 tablet (10 mg total) by mouth daily.   omeprazole  40 MG capsule Commonly known as: PRILOSEC Take 1 capsule (40 mg total) by mouth daily.   tadalafil  10 MG tablet Commonly known as: CIALIS  Take 1 tablet (10 mg total) by mouth daily as needed for erectile dysfunction. What changed: See the new instructions. Changed by: Fonda LABOR Ivaan Liddy   vitamin A 25000 UNIT capsule Take 25,000 Units by mouth daily.  Objective:   BP 138/84   Ht 5' 8 (1.727 m)   Wt 178 lb (80.7 kg)   SpO2 97%   BMI 27.06 kg/m   Wt Readings from Last 3 Encounters:  12/23/23 178 lb (80.7 kg)  08/23/23 178 lb (80.7 kg)  07/09/23 179 lb (81.2 kg)    Physical Exam Physical Exam   VITALS: BP- 138/84 CHEST: Lungs clear to auscultation. CARDIOVASCULAR: Regular heart rate and rhythm, no murmurs.         Assessment & Plan:   Problem List Items Addressed This Visit       Cardiovascular and Mediastinum   Hypertension associated with diabetes (HCC)   Relevant Medications   tadalafil   (CIALIS ) 10 MG tablet     Endocrine   T2DM (type 2 diabetes mellitus) (HCC) - Primary   Relevant Orders   Bayer DCA Hb A1c Waived     Other   Hyperlipidemia LDL goal <130   Relevant Medications   tadalafil  (CIALIS ) 10 MG tablet   ED (erectile dysfunction)   Relevant Medications   tadalafil  (CIALIS ) 10 MG tablet   Other Visit Diagnoses       History of recent fall              Gastroesophageal reflux disease (GERD) Chronic GERD with inadequate symptom control on current omeprazole  regimen. Increased mucus production possibly related to GERD. - Increase omeprazole  to twice daily for two weeks, then reassess. - Consider alternative medications if symptoms persist after two weeks.  Type 2 diabetes mellitus Recent improvement in glycemic control. Hemoglobin A1c decreased from 7.3% to 7.0%. - Continue dietary modifications focusing on reducing carbohydrate intake, especially when eating out. - Encourage consumption of sugar-free beverages and use of sugar alternatives like stevia.  Hypertension Lisinopril  regimen continued. Recent blood pressure reading was 138/84 mmHg. - Continue current lisinopril  regimen.  Hyperlipidemia Current cholesterol medication continued. - Continue current cholesterol medication.  Erectile dysfunction Managed with tadalafil , effective without side effects. - Refill tadalafil  prescription.          Follow up plan: Return in about 3 months (around 03/24/2024), or if symptoms worsen or fail to improve, for Diabetes recheck.  Counseling provided for all of the vaccine components Orders Placed This Encounter  Procedures   Bayer DCA Hb A1c Waived    Fonda Levins, MD [facility] 12/23/2023, 11:14 AM

## 2023-12-25 ENCOUNTER — Ambulatory Visit: Payer: Medicare Other

## 2023-12-25 DIAGNOSIS — I442 Atrioventricular block, complete: Secondary | ICD-10-CM | POA: Diagnosis not present

## 2023-12-26 LAB — CUP PACEART REMOTE DEVICE CHECK
Battery Remaining Longevity: 133 mo
Battery Voltage: 3.03 V
Brady Statistic AP VP Percent: 49.32 %
Brady Statistic AP VS Percent: 0.03 %
Brady Statistic AS VP Percent: 50.14 %
Brady Statistic AS VS Percent: 0.51 %
Brady Statistic RA Percent Paced: 49.45 %
Brady Statistic RV Percent Paced: 99.46 %
Date Time Interrogation Session: 20251008061857
Implantable Lead Connection Status: 753985
Implantable Lead Connection Status: 753985
Implantable Lead Implant Date: 20240709
Implantable Lead Implant Date: 20240709
Implantable Lead Location: 753859
Implantable Lead Location: 753860
Implantable Lead Model: 3830
Implantable Lead Model: 5076
Implantable Pulse Generator Implant Date: 20240709
Lead Channel Impedance Value: 285 Ohm
Lead Channel Impedance Value: 361 Ohm
Lead Channel Impedance Value: 437 Ohm
Lead Channel Impedance Value: 532 Ohm
Lead Channel Pacing Threshold Amplitude: 1 V
Lead Channel Pacing Threshold Amplitude: 1 V
Lead Channel Pacing Threshold Pulse Width: 0.4 ms
Lead Channel Pacing Threshold Pulse Width: 0.4 ms
Lead Channel Sensing Intrinsic Amplitude: 1.125 mV
Lead Channel Sensing Intrinsic Amplitude: 1.125 mV
Lead Channel Sensing Intrinsic Amplitude: 31.625 mV
Lead Channel Sensing Intrinsic Amplitude: 31.625 mV
Lead Channel Setting Pacing Amplitude: 1.5 V
Lead Channel Setting Pacing Amplitude: 2 V
Lead Channel Setting Pacing Pulse Width: 0.4 ms
Lead Channel Setting Sensing Sensitivity: 4 mV
Zone Setting Status: 755011

## 2023-12-27 NOTE — Progress Notes (Signed)
 Remote PPM Transmission

## 2023-12-30 ENCOUNTER — Ambulatory Visit: Payer: Self-pay | Admitting: Cardiology

## 2024-02-18 ENCOUNTER — Encounter (INDEPENDENT_AMBULATORY_CARE_PROVIDER_SITE_OTHER): Payer: Self-pay | Admitting: *Deleted

## 2024-03-25 ENCOUNTER — Ambulatory Visit: Payer: Medicare Other

## 2024-03-25 DIAGNOSIS — I442 Atrioventricular block, complete: Secondary | ICD-10-CM | POA: Diagnosis not present

## 2024-03-26 ENCOUNTER — Ambulatory Visit: Payer: Self-pay | Admitting: Cardiology

## 2024-03-26 LAB — CUP PACEART REMOTE DEVICE CHECK
Battery Remaining Longevity: 128 mo
Battery Voltage: 3.02 V
Brady Statistic AP VP Percent: 61.73 %
Brady Statistic AP VS Percent: 0.03 %
Brady Statistic AS VP Percent: 37.87 %
Brady Statistic AS VS Percent: 0.36 %
Brady Statistic RA Percent Paced: 61.83 %
Brady Statistic RV Percent Paced: 99.6 %
Date Time Interrogation Session: 20260107020930
Implantable Lead Connection Status: 753985
Implantable Lead Connection Status: 753985
Implantable Lead Implant Date: 20240709
Implantable Lead Implant Date: 20240709
Implantable Lead Location: 753859
Implantable Lead Location: 753860
Implantable Lead Model: 3830
Implantable Lead Model: 5076
Implantable Pulse Generator Implant Date: 20240709
Lead Channel Impedance Value: 304 Ohm
Lead Channel Impedance Value: 361 Ohm
Lead Channel Impedance Value: 456 Ohm
Lead Channel Impedance Value: 475 Ohm
Lead Channel Pacing Threshold Amplitude: 0.75 V
Lead Channel Pacing Threshold Amplitude: 0.875 V
Lead Channel Pacing Threshold Pulse Width: 0.4 ms
Lead Channel Pacing Threshold Pulse Width: 0.4 ms
Lead Channel Sensing Intrinsic Amplitude: 1.375 mV
Lead Channel Sensing Intrinsic Amplitude: 1.375 mV
Lead Channel Sensing Intrinsic Amplitude: 31.625 mV
Lead Channel Sensing Intrinsic Amplitude: 31.625 mV
Lead Channel Setting Pacing Amplitude: 1.5 V
Lead Channel Setting Pacing Amplitude: 2 V
Lead Channel Setting Pacing Pulse Width: 0.4 ms
Lead Channel Setting Sensing Sensitivity: 4 mV
Zone Setting Status: 755011

## 2024-03-30 ENCOUNTER — Encounter: Payer: Self-pay | Admitting: Family Medicine

## 2024-03-30 ENCOUNTER — Ambulatory Visit: Payer: Self-pay | Admitting: Family Medicine

## 2024-03-30 VITALS — BP 156/80 | HR 90 | Ht 68.0 in | Wt 174.0 lb

## 2024-03-30 DIAGNOSIS — E1159 Type 2 diabetes mellitus with other circulatory complications: Secondary | ICD-10-CM | POA: Diagnosis not present

## 2024-03-30 DIAGNOSIS — E785 Hyperlipidemia, unspecified: Secondary | ICD-10-CM | POA: Diagnosis not present

## 2024-03-30 DIAGNOSIS — I152 Hypertension secondary to endocrine disorders: Secondary | ICD-10-CM

## 2024-03-30 DIAGNOSIS — E119 Type 2 diabetes mellitus without complications: Secondary | ICD-10-CM

## 2024-03-30 LAB — LIPID PANEL

## 2024-03-30 LAB — BAYER DCA HB A1C WAIVED: HB A1C (BAYER DCA - WAIVED): 8 % — ABNORMAL HIGH (ref 4.8–5.6)

## 2024-03-30 NOTE — Progress Notes (Signed)
 "  BP (!) 156/80   Pulse 90   Ht 5' 8 (1.727 m)   Wt 174 lb (78.9 kg)   SpO2 99%   BMI 26.46 kg/m    Subjective:   Patient ID: Shawn Lambert, male    DOB: [birth year], 84 y.o.   MRN: [id number]  HPI: Shawn Lambert is a 84 y.o. male presenting on 03/30/2024 for Medical Management of Chronic Issues and Diabetes   Discussed the use of AI scribe software for clinical note transcription with the patient, who gave verbal consent to proceed.  History of Present Illness   [name] is an 84 year old male who presents for a recheck of elevated blood sugar levels.  Hyperglycemia - Hemoglobin A1c increased to 8.0 from 7.0 previously - Attributes rise in blood sugar to increased intake of sweets and cake during the holidays - Managing blood sugar through dietary modifications - Prefers to avoid initiation of pharmacologic therapy for glycemic control  Fall and musculoskeletal injury - Recent fall after tripping over a speed bump at [place] - Attributed fall to wearing 'slick old [employer]' shoes; has since switched to lighter footwear - Sustained epistaxis and facial injury from the fall - Persistent mild shoulder pain following the incident  Elevated blood pressure - Elevated blood pressure measured during this visit - Does not routinely monitor blood pressure at home - Diet includes pork, which he recognizes may contribute to elevated blood pressure - Does not add salt to food but acknowledges dietary sodium intake from certain foods  History of orthopedic hardware removal - History of having plates removed; no further details provided          Relevant past medical, surgical, family and social history reviewed and updated as indicated. Interim medical history since our last visit reviewed. Allergies and medications reviewed and updated.  Review of Systems  Constitutional:  Negative for chills and fever.  Respiratory:  Negative for shortness of breath and wheezing.    Cardiovascular:  Negative for chest pain and leg swelling.  Musculoskeletal:  Positive for arthralgias. Negative for back pain and gait problem.  Skin:  Negative for rash.  All other systems reviewed and are negative.   Per HPI unless specifically indicated above   Allergies as of 03/30/2024       Reactions   Penicillins Itching   Sulfonamide Derivatives Other (See Comments)   Reaction not known        Medication List        Accurate as of March 30, 2024 11:10 AM. If you have any questions, ask your nurse or doctor.          aspirin  EC 81 MG tablet Take 1 tablet (81 mg total) by mouth daily.   atorvastatin  40 MG tablet Commonly known as: LIPITOR Take 1 tablet (40 mg total) by mouth daily.   [facility] SPECTRAVITE ADULT 50+ PO Take 1 tablet by mouth daily. Reported on 08/01/2015   Glucosamine-Chondroitin-MSM 750-400-375 MG Tabs Take 1 tablet by mouth daily.   lisinopril  10 MG tablet Commonly known as: ZESTRIL  Take 1 tablet (10 mg total) by mouth daily.   omeprazole  40 MG capsule Commonly known as: PRILOSEC Take 1 capsule (40 mg total) by mouth daily.   tadalafil  10 MG tablet Commonly known as: CIALIS  Take 1 tablet (10 mg total) by mouth daily as needed for erectile dysfunction.   vitamin A 25000 UNIT capsule Take 25,000 Units by mouth daily.  Objective:   BP (!) 156/80   Pulse 90   Ht 5' 8 (1.727 m)   Wt 174 lb (78.9 kg)   SpO2 99%   BMI 26.46 kg/m   Wt Readings from Last 3 Encounters:  03/30/24 174 lb (78.9 kg)  12/23/23 178 lb (80.7 kg)  08/23/23 178 lb (80.7 kg)    Physical Exam Vitals and nursing note reviewed.  Constitutional:      General: He is not in acute distress.    Appearance: He is well-developed. He is not diaphoretic.  Eyes:     General: No scleral icterus.    Conjunctiva/sclera: Conjunctivae normal.  Neck:     Thyroid : No thyromegaly.  Cardiovascular:     Rate and Rhythm: Normal rate and regular rhythm.      Heart sounds: Normal heart sounds. No murmur heard. Pulmonary:     Effort: Pulmonary effort is normal. No respiratory distress.     Breath sounds: Normal breath sounds. No wheezing.  Musculoskeletal:        General: Normal range of motion.     Cervical back: Neck supple.  Lymphadenopathy:     Cervical: No cervical adenopathy.  Skin:    General: Skin is warm and dry.     Findings: No rash.  Neurological:     Mental Status: He is alert and oriented to person, place, and time.     Coordination: Coordination normal.  Psychiatric:        Behavior: Behavior normal.    Physical Exam   CHEST: Lungs clear to auscultation bilaterally. CARDIOVASCULAR: Heart sounds regular.         Assessment & Plan:   Problem List Items Addressed This Visit       Cardiovascular and Mediastinum   Hypertension associated with diabetes (HCC) - Primary   Relevant Orders   CBC with Differential/Platelet   CMP14+EGFR   Lipid panel   TSH   Bayer DCA Hb A1c Waived     Endocrine   T2DM (type 2 diabetes mellitus) (HCC)   Relevant Orders   Microalbumin / creatinine urine ratio     Other   Hyperlipidemia LDL goal <130   Relevant Orders   CBC with Differential/Platelet   CMP14+EGFR   Lipid panel   TSH   Bayer DCA Hb A1c Waived       Type 2 diabetes mellitus A1c increased to 8.0 from 7.0, likely due to dietary indiscretions. He prefers to avoid medication unless necessary. - Continue dietary modifications to manage blood glucose levels. - Reassess A1c in three months to determine need for medication.  Hypertension Blood pressure elevated, possibly due to dietary salt intake. He does not regularly monitor blood pressure at home. - Monitor blood pressure daily for two weeks and report readings. - Reduce intake of salty foods, particularly pork, and increase consumption of chicken, turkey, and fish.          Follow up plan: Return in about 3 months (around 06/28/2024), or if symptoms worsen  or fail to improve, for Diabetes recheck.  Counseling provided for all of the vaccine components Orders Placed This Encounter  Procedures   CBC with Differential/Platelet   CMP14+EGFR   Lipid panel   TSH   Bayer DCA Hb A1c Waived   Microalbumin / creatinine urine ratio    Fonda Levins, MD [facility] 03/30/2024, 11:10 AM     "

## 2024-03-30 NOTE — Progress Notes (Signed)
 Remote PPM Transmission

## 2024-03-31 LAB — MICROALBUMIN / CREATININE URINE RATIO
Creatinine, Urine: 95.7 mg/dL
Microalb/Creat Ratio: 7 mg/g{creat} (ref 0–29)
Microalbumin, Urine: 6.5 ug/mL

## 2024-03-31 LAB — CMP14+EGFR
ALT: 17 IU/L (ref 0–44)
AST: 18 IU/L (ref 0–40)
Albumin: 4 g/dL (ref 3.7–4.7)
Alkaline Phosphatase: 89 IU/L (ref 48–129)
BUN/Creatinine Ratio: 15 (ref 10–24)
BUN: 16 mg/dL (ref 8–27)
Bilirubin Total: 0.5 mg/dL (ref 0.0–1.2)
CO2: 25 mmol/L (ref 20–29)
Calcium: 9.4 mg/dL (ref 8.6–10.2)
Chloride: 104 mmol/L (ref 96–106)
Creatinine, Ser: 1.06 mg/dL (ref 0.76–1.27)
Globulin, Total: 2.1 g/dL (ref 1.5–4.5)
Glucose: 165 mg/dL — AB (ref 70–99)
Potassium: 4.8 mmol/L (ref 3.5–5.2)
Sodium: 139 mmol/L (ref 134–144)
Total Protein: 6.1 g/dL (ref 6.0–8.5)
eGFR: 70 mL/min/1.73

## 2024-03-31 LAB — CBC WITH DIFFERENTIAL/PLATELET
Basophils Absolute: 0 x10E3/uL (ref 0.0–0.2)
Basos: 1 %
EOS (ABSOLUTE): 0.1 x10E3/uL (ref 0.0–0.4)
Eos: 2 %
Hematocrit: 39 % (ref 37.5–51.0)
Hemoglobin: 12.7 g/dL — ABNORMAL LOW (ref 13.0–17.7)
Immature Grans (Abs): 0 x10E3/uL (ref 0.0–0.1)
Immature Granulocytes: 0 %
Lymphocytes Absolute: 0.9 x10E3/uL (ref 0.7–3.1)
Lymphs: 21 %
MCH: 31.4 pg (ref 26.6–33.0)
MCHC: 32.6 g/dL (ref 31.5–35.7)
MCV: 96 fL (ref 79–97)
Monocytes Absolute: 0.5 x10E3/uL (ref 0.1–0.9)
Monocytes: 12 %
Neutrophils Absolute: 2.7 x10E3/uL (ref 1.4–7.0)
Neutrophils: 64 %
Platelets: 194 x10E3/uL (ref 150–450)
RBC: 4.05 x10E6/uL — ABNORMAL LOW (ref 4.14–5.80)
RDW: 12.8 % (ref 11.6–15.4)
WBC: 4.2 x10E3/uL (ref 3.4–10.8)

## 2024-03-31 LAB — LIPID PANEL
Cholesterol, Total: 170 mg/dL (ref 100–199)
HDL: 54 mg/dL
LDL CALC COMMENT:: 3.1 ratio (ref 0.0–5.0)
LDL Chol Calc (NIH): 107 mg/dL — AB (ref 0–99)
Triglycerides: 42 mg/dL (ref 0–149)
VLDL Cholesterol Cal: 9 mg/dL (ref 5–40)

## 2024-03-31 LAB — TSH: TSH: 3.26 u[IU]/mL (ref 0.450–4.500)

## 2024-04-06 ENCOUNTER — Ambulatory Visit: Payer: Self-pay | Admitting: Family Medicine

## 2024-04-06 MED ORDER — ATORVASTATIN CALCIUM 80 MG PO TABS: 80.0000 mg | ORAL_TABLET | Freq: Every day | ORAL | 3 refills | Status: AC

## 2024-07-01 ENCOUNTER — Ambulatory Visit: Admitting: Family Medicine
